# Patient Record
Sex: Male | Born: 1975 | Race: Black or African American | Hispanic: No | Marital: Single | Smoking: Current every day smoker
Health system: Southern US, Community
[De-identification: ages and names within clinical notes are randomized; demographics above are authoritative.]

## PROBLEM LIST (undated history)

## (undated) DIAGNOSIS — Z789 Other specified health status: Secondary | ICD-10-CM

## (undated) DIAGNOSIS — F1010 Alcohol abuse, uncomplicated: Secondary | ICD-10-CM

## (undated) DIAGNOSIS — Z5900 Homelessness unspecified: Secondary | ICD-10-CM

## (undated) DIAGNOSIS — F4320 Adjustment disorder, unspecified: Secondary | ICD-10-CM

## (undated) DIAGNOSIS — I10 Essential (primary) hypertension: Secondary | ICD-10-CM

## (undated) DIAGNOSIS — M419 Scoliosis, unspecified: Secondary | ICD-10-CM

## (undated) DIAGNOSIS — K922 Gastrointestinal hemorrhage, unspecified: Secondary | ICD-10-CM

## (undated) DIAGNOSIS — I509 Heart failure, unspecified: Secondary | ICD-10-CM

## (undated) HISTORY — PX: NO PAST SURGERIES: SHX2092

---

## 2007-01-21 ENCOUNTER — Emergency Department (HOSPITAL_COMMUNITY): Admission: EM | Admit: 2007-01-21 | Discharge: 2007-01-21 | Payer: Self-pay | Admitting: Emergency Medicine

## 2007-06-11 ENCOUNTER — Emergency Department (HOSPITAL_COMMUNITY): Admission: EM | Admit: 2007-06-11 | Discharge: 2007-06-11 | Payer: Self-pay | Admitting: Emergency Medicine

## 2007-10-21 ENCOUNTER — Emergency Department (HOSPITAL_COMMUNITY): Admission: EM | Admit: 2007-10-21 | Discharge: 2007-10-22 | Payer: Self-pay | Admitting: Emergency Medicine

## 2008-07-27 ENCOUNTER — Emergency Department (HOSPITAL_COMMUNITY): Admission: EM | Admit: 2008-07-27 | Discharge: 2008-07-27 | Payer: Self-pay | Admitting: Emergency Medicine

## 2009-03-23 ENCOUNTER — Emergency Department (HOSPITAL_COMMUNITY): Admission: EM | Admit: 2009-03-23 | Discharge: 2009-03-24 | Payer: Self-pay | Admitting: Emergency Medicine

## 2011-03-23 LAB — POCT I-STAT, CHEM 8
BUN: 5 mg/dL — ABNORMAL LOW (ref 6–23)
HCT: 47 % (ref 39.0–52.0)
Hemoglobin: 16 g/dL (ref 13.0–17.0)
Potassium: 3.2 mEq/L — ABNORMAL LOW (ref 3.5–5.1)
TCO2: 24 mmol/L (ref 0–100)

## 2011-03-23 LAB — DIFFERENTIAL
Basophils Absolute: 0 10*3/uL (ref 0.0–0.1)
Basophils Relative: 1 % (ref 0–1)
Eosinophils Absolute: 0.8 10*3/uL — ABNORMAL HIGH (ref 0.0–0.7)
Eosinophils Relative: 12 % — ABNORMAL HIGH (ref 0–5)
Neutro Abs: 3.1 10*3/uL (ref 1.7–7.7)

## 2011-03-23 LAB — ETHANOL: Alcohol, Ethyl (B): 245 mg/dL — ABNORMAL HIGH (ref 0–10)

## 2011-06-23 ENCOUNTER — Emergency Department (HOSPITAL_COMMUNITY): Payer: Medicaid Other

## 2011-06-23 ENCOUNTER — Emergency Department (HOSPITAL_COMMUNITY)
Admission: EM | Admit: 2011-06-23 | Discharge: 2011-06-23 | Disposition: A | Discharge status: Home / Self Care | Payer: Medicaid Other | Attending: Emergency Medicine | Admitting: Emergency Medicine

## 2011-06-23 DIAGNOSIS — R51 Headache: Secondary | ICD-10-CM | POA: Insufficient documentation

## 2011-06-23 DIAGNOSIS — H53149 Visual discomfort, unspecified: Secondary | ICD-10-CM | POA: Insufficient documentation

## 2011-06-23 DIAGNOSIS — H532 Diplopia: Secondary | ICD-10-CM | POA: Insufficient documentation

## 2011-06-23 DIAGNOSIS — I10 Essential (primary) hypertension: Secondary | ICD-10-CM | POA: Insufficient documentation

## 2011-06-23 LAB — BASIC METABOLIC PANEL
BUN: 10 mg/dL (ref 6–23)
CO2: 25 mEq/L (ref 19–32)
Calcium: 10.2 mg/dL (ref 8.4–10.5)
Chloride: 100 mEq/L (ref 96–112)
Creatinine, Ser: 1.12 mg/dL (ref 0.50–1.35)
GFR calc non Af Amer: >60 mL/min (ref >60)
Potassium: 4.1 mEq/L (ref 3.5–5.1)
Sodium: 135 mEq/L (ref 135–145)

## 2011-06-23 LAB — SEDIMENTATION RATE: Sed Rate: 1 mm/hr (ref 0–16)

## 2013-02-13 ENCOUNTER — Emergency Department (HOSPITAL_COMMUNITY)
Admission: EM | Admit: 2013-02-13 | Discharge: 2013-02-14 | Disposition: A | Discharge status: Other Acute Inpatient Hospital | Payer: Medicaid Other | Attending: Emergency Medicine | Admitting: Emergency Medicine

## 2013-02-13 ENCOUNTER — Encounter (HOSPITAL_COMMUNITY): Payer: Self-pay | Admitting: *Deleted

## 2013-02-13 DIAGNOSIS — F17200 Nicotine dependence, unspecified, uncomplicated: Secondary | ICD-10-CM | POA: Insufficient documentation

## 2013-02-13 DIAGNOSIS — Z8739 Personal history of other diseases of the musculoskeletal system and connective tissue: Secondary | ICD-10-CM | POA: Insufficient documentation

## 2013-02-13 DIAGNOSIS — F3289 Other specified depressive episodes: Secondary | ICD-10-CM | POA: Insufficient documentation

## 2013-02-13 DIAGNOSIS — F1010 Alcohol abuse, uncomplicated: Secondary | ICD-10-CM | POA: Insufficient documentation

## 2013-02-13 DIAGNOSIS — R45851 Suicidal ideations: Secondary | ICD-10-CM | POA: Insufficient documentation

## 2013-02-13 HISTORY — DX: Scoliosis, unspecified: M41.9

## 2013-02-13 LAB — CBC WITH DIFFERENTIAL/PLATELET
Eosinophils Relative: 6 % — ABNORMAL HIGH (ref 0–5)
HCT: 46.9 % (ref 39.0–52.0)
MCHC: 35.4 g/dL (ref 30.0–36.0)
MCV: 85.3 fL (ref 78.0–100.0)
Monocytes Absolute: 0.5 10*3/uL (ref 0.1–1.0)
Neutro Abs: 2.9 10*3/uL (ref 1.7–7.7)

## 2013-02-13 LAB — ETHANOL: Alcohol, Ethyl (B): 195 mg/dL — ABNORMAL HIGH (ref 0–11)

## 2013-02-13 LAB — COMPREHENSIVE METABOLIC PANEL
ALT: 20 U/L (ref 0–53)
AST: 26 U/L (ref 0–37)
Albumin: 4.3 g/dL (ref 3.5–5.2)
Calcium: 9.3 mg/dL (ref 8.4–10.5)
Creatinine, Ser: 0.94 mg/dL (ref 0.50–1.35)
GFR calc Af Amer: >90 mL/min (ref >90)
Glucose, Bld: 83 mg/dL (ref 70–99)

## 2013-02-13 MED ORDER — ONDANSETRON HCL 4 MG PO TABS: 4.0000 mg | ORAL_TABLET | Freq: Three times a day (TID) | ORAL | Status: DC | PRN

## 2013-02-13 MED ORDER — ALUM & MAG HYDROXIDE-SIMETH 200-200-20 MG/5ML PO SUSP
30.0000 mL | ORAL | Status: DC | PRN
Start: 2013-02-13 — End: 2013-02-14

## 2013-02-13 MED ORDER — THIAMINE HCL 100 MG/ML IJ SOLN
100.0000 mg | Freq: Once | INTRAMUSCULAR | Status: AC
  Administered 2013-02-13: 21:00:00 via INTRAVENOUS
  Filled 2013-02-13: qty 2

## 2013-02-13 MED ORDER — LORAZEPAM 1 MG PO TABS: 1.0000 mg | ORAL_TABLET | Freq: Three times a day (TID) | ORAL | Status: DC | PRN

## 2013-02-13 MED ORDER — ACETAMINOPHEN 325 MG PO TABS: 650.0000 mg | ORAL_TABLET | ORAL | Status: DC | PRN

## 2013-02-13 MED ORDER — SODIUM CHLORIDE 0.9 % IV BOLUS (SEPSIS)
1000.0000 mL | Freq: Once | INTRAVENOUS | Status: AC
  Administered 2013-02-13: 1000 mL via INTRAVENOUS

## 2013-02-13 NOTE — ED Notes (Signed)
Pt's states he called 911 d/t he wanted to harm himself. Pt states that he was trying to hurt himself with a fork. Pt with several superficial cuts to right arm. Pt with etoh on board.

## 2013-02-13 NOTE — ED Provider Notes (Signed)
History    CSN: [id number] Arrival date & time 02/13/13  2034 First MD Initiated Contact with Patient 02/13/13 2046      Chief Complaint  Patient presents with  . Psychiatric Evaluation   HPI The patient presented to the emergency room with complaints of depression and suicidal ideation.  The patient had been drinking alcohol. He called 911 because he was depressed and he wants to harm himself. Patient was found stabbing his arm with a barbecue fork.  He states he is depressed about lots of things but cannot specify.  He denies any ingestion. Past Medical History  Diagnosis Date  . Scoliosis     No past surgical history on file.  No family history on file.  History  Substance Use Topics  . Smoking status: Current Every Day Smoker    Types: Cigarettes  . Smokeless tobacco: Not on file  . Alcohol Use: Yes      Review of Systems  All other systems reviewed and are negative.    Allergies  Review of patient's allergies indicates no known allergies.  Home Medications  No current outpatient prescriptions on file.  BP 128/94  Pulse 92  Temp(Src) 98.3 F (36.8 C) (Oral)  Resp 16  Ht 4\' 9"  (1.448 m)  Wt 135 lb (61.236 kg)  BMI 29.21 kg/m2  SpO2 100%  Physical Exam  Nursing note and vitals reviewed. Constitutional: He appears well-developed and well-nourished. No distress.  Patient appears intoxicated, his speech is slurred  HENT:  Head: Normocephalic and atraumatic.  Right Ear: External ear normal.  Left Ear: External ear normal.  Eyes: Conjunctivae are normal. Right eye exhibits no discharge. Left eye exhibits no discharge. No scleral icterus.  Neck: Neck supple. No tracheal deviation present.  Cardiovascular: Normal rate, regular rhythm and intact distal pulses.   Pulmonary/Chest: Effort normal and breath sounds normal. No stridor. No respiratory distress. He has no wheezes. He has no rales.  Abdominal: Soft. Bowel sounds are normal. He exhibits no distension.  There is no tenderness. There is no rebound and no guarding.  Musculoskeletal: He exhibits no edema and no tenderness.  Neurological: He is alert. He has normal strength. No sensory deficit. Cranial nerve deficit:  no gross defecits noted. He exhibits normal muscle tone. He displays no seizure activity. Coordination abnormal.  Skin: Skin is warm and dry. No rash noted. He is not diaphoretic.  Psychiatric: His speech is slurred. He is not agitated and not aggressive. He exhibits a depressed mood. He expresses suicidal ideation.    ED Course  Procedures (including critical care time)  Labs Reviewed  CBC WITH DIFFERENTIAL - Abnormal; Notable for the following:    Neutrophils Relative 40 (*)    Eosinophils Relative 6 (*)    All other components within normal limits  COMPREHENSIVE METABOLIC PANEL - Abnormal; Notable for the following:    Total Protein 8.4 (*)    Total Bilirubin 0.2 (*)    All other components within normal limits  ETHANOL - Abnormal; Notable for the following:    Alcohol, Ethyl (B) 195 (*)    All other components within normal limits   No results found.    MDM  The patient is intoxicated here in the emergency room but is also complaining of suicidal ideation. The patient will sober up in the emergency department and then the  Act team will assess him        Celene Kras, MD 02/13/13 [id number]

## 2013-02-13 NOTE — ED Notes (Signed)
[id number]<AV> Expected date:<BR> Expected time:<BR> Means of arrival:<BR> Comments:<BR> EMS/IVC-in by [facility]

## 2013-02-13 NOTE — ED Notes (Addendum)
Pt belongings in 2 bags at nurses' station.    Security called to search pt and belongings.

## 2013-02-14 ENCOUNTER — Encounter (HOSPITAL_COMMUNITY): Payer: Self-pay | Admitting: *Deleted

## 2013-02-14 ENCOUNTER — Inpatient Hospital Stay (HOSPITAL_COMMUNITY)
Admission: EM | Admit: 2013-02-14 | Discharge: 2013-02-15 | DRG: 882 | Disposition: A | Discharge status: Home / Self Care | Payer: Medicaid Other | Source: Intra-hospital | Attending: Psychiatry | Admitting: Psychiatry

## 2013-02-14 ENCOUNTER — Encounter (HOSPITAL_COMMUNITY): Payer: Self-pay

## 2013-02-14 DIAGNOSIS — F1210 Cannabis abuse, uncomplicated: Secondary | ICD-10-CM

## 2013-02-14 DIAGNOSIS — F411 Generalized anxiety disorder: Secondary | ICD-10-CM

## 2013-02-14 DIAGNOSIS — F4323 Adjustment disorder with mixed anxiety and depressed mood: Principal | ICD-10-CM

## 2013-02-14 DIAGNOSIS — Z79899 Other long term (current) drug therapy: Secondary | ICD-10-CM

## 2013-02-14 HISTORY — DX: Other specified health status: Z78.9

## 2013-02-14 LAB — RAPID URINE DRUG SCREEN, HOSP PERFORMED
Cocaine: NOT DETECTED
Opiates: NOT DETECTED

## 2013-02-14 MED ORDER — ONDANSETRON 4 MG PO TBDP: 4.0000 mg | ORAL_TABLET | Freq: Four times a day (QID) | ORAL | Status: DC | PRN

## 2013-02-14 MED ORDER — MAGNESIUM HYDROXIDE 400 MG/5ML PO SUSP: 30.0000 mL | Freq: Every day | ORAL | Status: DC | PRN

## 2013-02-14 MED ORDER — PNEUMOCOCCAL VAC POLYVALENT 25 MCG/0.5ML IJ INJ: 0.5000 mL | INJECTION | INTRAMUSCULAR | Status: DC

## 2013-02-14 MED ORDER — CHLORDIAZEPOXIDE HCL 25 MG PO CAPS
25.0000 mg | ORAL_CAPSULE | Freq: Once | ORAL | Status: AC
  Administered 2013-02-14: 25 mg via ORAL
  Filled 2013-02-14: qty 1

## 2013-02-14 MED ORDER — ADULT MULTIVITAMIN W/MINERALS CH
1.0000 | ORAL_TABLET | Freq: Every day | ORAL | Status: DC
  Administered 2013-02-14 – 2013-02-15 (×2): 1 via ORAL
  Filled 2013-02-14 (×5): qty 1

## 2013-02-14 MED ORDER — LOPERAMIDE HCL 2 MG PO CAPS: 2.0000 mg | ORAL_CAPSULE | ORAL | Status: DC | PRN

## 2013-02-14 MED ORDER — HYDROXYZINE HCL 25 MG PO TABS: 25.0000 mg | ORAL_TABLET | Freq: Four times a day (QID) | ORAL | Status: DC | PRN

## 2013-02-14 MED ORDER — THIAMINE HCL 100 MG/ML IJ SOLN
100.0000 mg | Freq: Once | INTRAMUSCULAR | Status: AC
  Administered 2013-02-14: 100 mg via INTRAMUSCULAR

## 2013-02-14 MED ORDER — INFLUENZA VIRUS VACC SPLIT PF IM SUSP
0.5000 mL | INTRAMUSCULAR | Status: AC
  Administered 2013-02-15: 0.5 mL via INTRAMUSCULAR

## 2013-02-14 MED ORDER — NICOTINE 14 MG/24HR TD PT24
14.0000 mg | MEDICATED_PATCH | Freq: Every day | TRANSDERMAL | Status: DC
  Administered 2013-02-14 – 2013-02-15 (×2): 14 mg via TRANSDERMAL
  Filled 2013-02-14 (×6): qty 1

## 2013-02-14 MED ORDER — ACETAMINOPHEN 325 MG PO TABS: 650.0000 mg | ORAL_TABLET | Freq: Four times a day (QID) | ORAL | Status: DC | PRN

## 2013-02-14 MED ORDER — ALUM & MAG HYDROXIDE-SIMETH 200-200-20 MG/5ML PO SUSP: 30.0000 mL | ORAL | Status: DC | PRN

## 2013-02-14 MED ORDER — CHLORDIAZEPOXIDE HCL 25 MG PO CAPS
25.0000 mg | ORAL_CAPSULE | Freq: Four times a day (QID) | ORAL | Status: DC | PRN
  Administered 2013-02-14: 25 mg via ORAL
  Filled 2013-02-14: qty 1

## 2013-02-14 MED ORDER — TRAZODONE HCL 50 MG PO TABS
50.0000 mg | ORAL_TABLET | Freq: Every evening | ORAL | Status: DC | PRN
  Administered 2013-02-14: 50 mg via ORAL
  Filled 2013-02-14 (×8): qty 1

## 2013-02-14 MED ORDER — VITAMIN B-1 100 MG PO TABS
100.0000 mg | ORAL_TABLET | Freq: Every day | ORAL | Status: DC
  Administered 2013-02-15: 100 mg via ORAL
  Filled 2013-02-14 (×4): qty 1

## 2013-02-14 NOTE — Progress Notes (Signed)
Adult Psychoeducational Group Note  Date:  02/14/2013 Time:  3:43 PM  Group Topic/Focus:  Overcoming Stress:   The focus of this group is to define stress and help patients assess their triggers.  Participation Level:  Minimal  Participation Quality:  Appropriate  Affect:  Appropriate  Cognitive:  Alert and Appropriate  Insight: Lacking  Engagement in Group:  Engaged  Modes of Intervention:  Education and Support  Additional Comments:  Pt participated in group discussion when called upon. 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 Pt identified that he would walk away from stressful situations in order to cope. Staff identified that walking away from small children could produce a safety hazard. Staff noted the importance of boundary-setting with children as a proactive way to reduce stressful situations.  Reinaldo Raddle K 02/14/2013, 3:43 PM

## 2013-02-14 NOTE — H&P (Signed)
Psychiatric Admission Assessment Adult  Patient Identification:  Fred [name] Date of Evaluation:  02/14/2013 Chief Complaint:  Depressive Disorder History of Present Illness:: Got upset with different things going on at home. He felt that his cousin's father was taking money from him. He was also dealing with the fact he wants to have a closer relationship with his mother and maybe move back with her. She does not want that. He has gotten more frustrated. He called 911 saying that he was going to hurst himself. Aunt raised him, she died in front of him. She helped to take care of her. He has a son in [place] who he hardly sees. Claims he drinks one beer every day six out of seven days. Has had black outs. Smoked marijuana every day: "makes me happy." Elements:  Location:  in patient. Quality:  overwhelmed, ruminates,worries. Severity:  moderate. Timing:  every day. Duration:  building up peaked a day ago. Context:  Difficulty coping. Associated Signs/Synptoms: Depression Symptoms:  depressed mood, (Hypo) Manic Symptoms:  Denies Anxiety Symptoms:  Excessive Worry, Psychotic Symptoms:  Denies PTSD Symptoms: Had a traumatic exposure:  aunt died in front of him   Psychiatric Specialty Exam: Physical Exam  Review of Systems  Constitutional: Negative.   HENT: Negative.   Eyes: Negative.   Respiratory:       6 a day  Cardiovascular: Negative.   Gastrointestinal: Negative.   Genitourinary: Negative.   Skin: Negative.   Neurological: Negative.   Endo/Heme/Allergies: Negative.   Psychiatric/Behavioral: Positive for substance abuse. The patient is nervous/anxious.     Blood pressure 147/101, pulse 70, temperature 98 F (36.7 C), temperature source Oral, resp. rate 16, height 5' (1.524 m), weight 60.782 kg (134 lb).Body mass index is 26.17 kg/(m^2).  General Appearance: Disheveled  Eye Solicitor::  Fair  Speech:  Clear and Coherent  Volume:  Normal  Mood:  wooried  Affect:   Restricted  Thought Process:  Coherent and Goal Directed  Orientation:  Full (Time, Place, and Person)  Thought Content:  WDL and worries and concerns  Suicidal Thoughts:  No  Homicidal Thoughts:  No  Memory:  Immediate;   Fair Recent;   Fair Remote;   Fair  Judgement:  Fair  Insight:  superficial  Psychomotor Activity:  Restlessness  Concentration:  Fair  Recall:  Fair  Akathisia:  No  Handed:  Left  AIMS (if indicated):     Assets:  Desire for Improvement Housing Social Support  Sleep:       Past Psychiatric History: Diagnosis: Adjustment Disorder with mixed emotional features, marijuana, alcohol abuse  Hospitalizations: Denies  Outpatient Care: Denies  Substance Abuse Care: Denies  Self-Mutilation: Denies  Suicidal Attempts: Denies  Violent Behaviors:Denies   Past Medical History:   Past Medical History  Diagnosis Date  . Scoliosis   . Medical history non-contributory     Allergies:  No Known Allergies PTA Medications: No prescriptions prior to admission    Previous Psychotropic Medications:  Medication/Dose  Denies               Substance Abuse History in the last 12 months:  yes  Consequences of Substance Abuse: Blackouts:    Social History:  reports that he has been smoking Cigarettes.  He has a 10 pack-year smoking history. He does not have any smokeless tobacco history on file. He reports that  drinks alcohol. He reports that he uses illicit drugs (Marijuana) about 6 times per week. Additional Social History:  Current Place of Residence:  Lives with his cousin.  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 Legal History: Early on some charges Hobbies/Interests:  Family History:  No family history on file.  Results for orders placed during the hospital encounter of 02/13/13 (from the past 72 hour(s))  CBC WITH DIFFERENTIAL     Status: Abnormal   Collection Time    02/13/13  9:22 PM      Result Value Range   WBC 7.2  4.0 - 10.5 K/uL   RBC 5.50  4.22 - 5.81 MIL/uL   Hemoglobin 16.6  13.0 - 17.0 g/dL   HCT 40.9  81.1 - 91.4 %   MCV 85.3  78.0 - 100.0 fL   MCH 30.2  26.0 - 34.0 pg   MCHC 35.4  30.0 - 36.0 g/dL   RDW 78.2  95.6 - 21.3 %   Platelets 248  150 - 400 K/uL   Neutrophils Relative 40 (*) 43 - 77 %   Neutro Abs 2.9  1.7 - 7.7 K/uL   Lymphocytes Relative 46  12 - 46 %   Lymphs Abs 3.3  0.7 - 4.0 K/uL   Monocytes Relative 7  3 - 12 %   Monocytes Absolute 0.5  0.1 - 1.0 K/uL   Eosinophils Relative 6 (*) 0 - 5 %   Eosinophils Absolute 0.5  0.0 - 0.7 K/uL   Basophils Relative 1  0 - 1 %   Basophils Absolute 0.0  0.0 - 0.1 K/uL  COMPREHENSIVE METABOLIC PANEL     Status: Abnormal   Collection Time    02/13/13  9:22 PM      Result Value Range   Sodium 143  135 - 145 mEq/L   Potassium 3.7  3.5 - 5.1 mEq/L   Chloride 104  96 - 112 mEq/L   CO2 26  19 - 32 mEq/L   Glucose, Bld 83  70 - 99 mg/dL   BUN 10  6 - 23 mg/dL   Creatinine, Ser 0.86  0.50 - 1.35 mg/dL   Calcium 9.3  8.4 - 57.8 mg/dL   Total Protein 8.4 (*) 6.0 - 8.3 g/dL   Albumin 4.3  3.5 - 5.2 g/dL   AST 26  0 - 37 U/L   ALT 20  0 - 53 U/L   Alkaline Phosphatase 98  39 - 117 U/L   Total Bilirubin 0.2 (*) 0.3 - 1.2 mg/dL   GFR calc non Af Amer >90  >90 mL/min   GFR calc Af Amer >90  >90 mL/min   Comment:            The eGFR has been calculated     using the CKD EPI equation.     This calculation has not been     validated in all clinical     situations.     eGFR's persistently     <90 mL/min signify     possible Chronic  Kidney Disease.  ETHANOL     Status: Abnormal   Collection Time    02/13/13  9:22 PM      Result Value Range   Alcohol,  Ethyl (B) 195 (*) 0 - 11 mg/dL   Comment:            LOWEST DETECTABLE LIMIT FOR     SERUM ALCOHOL IS 11 mg/dL     FOR MEDICAL PURPOSES ONLY  URINE RAPID DRUG SCREEN (HOSP PERFORMED)     Status: Abnormal   Collection Time    02/14/13  2:23 AM      Result Value Range   Opiates NONE DETECTED  NONE DETECTED   Cocaine NONE DETECTED  NONE DETECTED   Benzodiazepines NONE DETECTED  NONE DETECTED   Amphetamines NONE DETECTED  NONE DETECTED   Tetrahydrocannabinol POSITIVE (*) NONE DETECTED   Barbiturates NONE DETECTED  NONE DETECTED   Comment:            DRUG SCREEN FOR MEDICAL PURPOSES     ONLY.  IF CONFIRMATION IS NEEDED     FOR ANY PURPOSE, NOTIFY LAB     WITHIN 5 DAYS.                LOWEST DETECTABLE LIMITS     FOR URINE DRUG SCREEN     Drug Class       Cutoff (ng/mL)     Amphetamine      1000     Barbiturate      200     Benzodiazepine   200     Tricyclics       300     Opiates          300     Cocaine          300     THC              50   Psychological Evaluations:  Assessment:   AXIS I:  Anxiety Disorder NOS, Adjustment Disorder with mixed emotional features, Marijuana Abuse AXIS II:  Deferred AXIS III:   Past Medical History  Diagnosis Date  . Scoliosis   . Medical history non-contributory    AXIS IV:  problems with primary support group AXIS V:  41-50  Treatment Plan/Recommendations:  Supportive approach/coping skills                                                                 Reassess for need of taking psychotropics  Treatment Plan Summary: Daily contact with patient to assess and evaluate symptoms and progress in treatment Medication management Get more information Current Medications:  Current Facility-Administered Medications  Medication Dose Route Frequency Trenyce Loera Last Rate Last Dose  . acetaminophen (TYLENOL) tablet 650 mg   650 mg Oral Q6H PRN Kerry Hough, PA-C      . alum & mag hydroxide-simeth (MAALOX/MYLANTA) 200-200-20 MG/5ML suspension 30 mL  30 mL Oral Q4H PRN Kerry Hough, PA-C      . chlordiazePOXIDE (LIBRIUM) capsule 25 mg  25 mg Oral Q6H PRN Kerry Hough, PA-C      . hydrOXYzine (ATARAX/VISTARIL) tablet 25 mg  25 mg Oral Q6H PRN Kerry Hough, PA-C      . [START ON 02/15/2013] influenza  inactive virus vaccine (FLUZONE/FLUARIX) injection 0.5 mL  0.5 mL Intramuscular Tomorrow-1000 Rachael Fee, MD      . loperamide (IMODIUM) capsule 2-4 mg  2-4 mg  Oral PRN Kerry Hough, PA-C      . magnesium hydroxide (MILK OF MAGNESIA) suspension 30 mL  30 mL Oral Daily PRN Kerry Hough, PA-C      . multivitamin with minerals tablet 1 tablet  1 tablet Oral Daily Kerry Hough, PA-C   1 tablet at 02/14/13 0837  . nicotine (NICODERM CQ - dosed in mg/24 hours) patch 14 mg  14 mg Transdermal Q0600 Kerry Hough, PA-C   14 mg at 02/14/13 1478  . ondansetron (ZOFRAN-ODT) disintegrating tablet 4 mg  4 mg Oral Q6H PRN Kerry Hough, PA-C      . [START ON 02/15/2013] pneumococcal 23 valent vaccine (PNU-IMMUNE) injection 0.5 mL  0.5 mL Intramuscular Tomorrow-1000 Rachael Fee, MD      . Melene Muller ON 02/15/2013] thiamine (VITAMIN B-1) tablet 100 mg  100 mg Oral Daily Spencer E Simon, PA-C      . traZODone (DESYREL) tablet 50 mg  50 mg Oral QHS,MR X 1 Kerry Hough, PA-C        Observation Level/Precautions:  15 minute checks  Laboratory:  As per the ED  Psychotherapy:  Individual/group  Medications:  None  Consultations:    Discharge Concerns:    Estimated LOS: 3 days  Other:     I certify that inpatient services furnished can reasonably be expected to improve the patient's condition.   LUGO,IRVING A 3/6/20148:51 AM

## 2013-02-14 NOTE — BH Assessment (Signed)
Assessment Note   Fred [name] is an 37 y.o. male. Patient presented to the ED after calling 911 stating that he wanted to harm himself. Patient states that he felt like "everything was closing in" . Patient states that he got into an argument with his cousin's father today because he was trying to explain that he was missing money out of his bag. He stated that no one ever listens to him and he feels misunderstood. Patient states that his mother is also a precipitant for him. He states that he has not seen his mother who lives in [place] since he was 37 years old. He was raised by family members here in [place]. States that he last spoke with his mother in December 2013 and she is not agreeable to him coming to stay with her in [place].   Patient states he felt that everything was closing in and that no one was listening to him or understood him so he went into the hallway and attempted "to cut my main artery with the pitch fork." Patient denies any prior attempts and no history of SIB. Patient is currently unable to contract for safety and is in need of inpatient stabilization.   Axis I: Depressive Disorder NOS and THC Abuse, Alcohol Abuse Axis II: Deferred Axis III:  Past Medical History  Diagnosis Date  . Scoliosis    Axis IV: other psychosocial or environmental problems, problems related to social environment and problems with primary support group Axis V: 35  Past Medical History:  Past Medical History  Diagnosis Date  . Scoliosis     History reviewed. No pertinent past surgical history.  Family History: History reviewed. No pertinent family history.  Social History:  reports that he has been smoking Cigarettes.  He has been smoking about 0.00 packs per day. He does not have any smokeless tobacco history on file. He reports that  drinks alcohol. He reports that he does not use illicit drugs.  Additional Social History:  Alcohol / Drug Use History of alcohol / drug use?:  Yes Substance #1 Name of Substance 1: THC 1 - Age of First Use: 17 1 - Amount (size/oz): "As much as I can get" 1 - Frequency: Daily 1 - Duration: years 1 - Last Use / Amount: last night/ 1 joint Substance #2 Name of Substance 2: Alcohol 2 - Age of First Use: 17 2 - Amount (size/oz): few beers 2 - Frequency: sporadic use 2 - Duration: years 2 - Last Use / Amount: last night/few beers  CIWA: CIWA-Ar BP: 128/94 mmHg Pulse Rate: 92 COWS:    Allergies: No Known Allergies  Home Medications:  (Not in a hospital admission)  OB/GYN Status:  No LMP for male patient.  General Assessment Data Location of Assessment: [facility] Living Arrangements: Other relatives (Cousin) Can pt return to current living arrangement?: Yes Admission Status: Involuntary Is patient capable of signing voluntary admission?: Yes Transfer from: Home Referral Source: MD  Education Status Is patient currently in school?: No Current Grade:  (Na) Highest grade of school patient has completed:  (11th) Name of school:  [employer]) Contact person:  Ethelene Browns [place]/ Cousin)  Risk to self Suicidal Ideation: Yes-Currently Present Suicidal Intent: Yes-Currently Present Is patient at risk for suicide?: Yes Suicidal Plan?: Yes-Currently Present Specify Current Suicidal Plan:  (attempted to "cut main artery with barbecue fork) Access to Means: Yes Specify Access to Suicidal Means:  (sharps) What has been your use of drugs/alcohol within  the last 12 months?:  (Daily) Previous Attempts/Gestures: No How many times?:  (None noted) Other Self Harm Risks:  (None noted) Triggers for Past Attempts: None known Intentional Self Injurious Behavior: None Family Suicide History: No Recent stressful life event(s): Conflict (Comment) (with family members) Persecutory voices/beliefs?: No Depression: Yes Depression Symptoms: Feeling angry/irritable;Feeling worthless/self pity Substance abuse history and/or  treatment for substance abuse?: Yes Suicide prevention information given to non-admitted patients: Not applicable  Risk to Others Homicidal Ideation: No Thoughts of Harm to Others: No Current Homicidal Intent: No Current Homicidal Plan: No Access to Homicidal Means: No Identified Victim:  (Na) History of harm to others?: No Assessment of Violence: None Noted Violent Behavior Description:  (Na) Does patient have access to weapons?: No Criminal Charges Pending?: No Does patient have a court date: No  Psychosis Hallucinations: None noted Delusions: None noted  Mental Status Report Appear/Hygiene:  (Blue scrubs) Eye Contact: Fair Motor Activity: Freedom of movement;Unremarkable Speech: Logical/coherent Level of Consciousness: Alert Mood: Depressed;Worthless, low self-esteem Affect: Appropriate to circumstance Anxiety Level: None Thought Processes: Coherent;Relevant Judgement: Impaired Orientation: Person;Place;Time;Situation Obsessive Compulsive Thoughts/Behaviors: None  Cognitive Functioning Concentration: Normal Memory: Recent Intact;Remote Intact IQ: Average Insight: Poor Impulse Control: Poor Appetite: Good Weight Loss:  (None noted) Weight Gain:  (none noted) Sleep: No Change Total Hours of Sleep:  (8 hours) Vegetative Symptoms: None  ADLScreening [facility] Assessment Services) Patient's cognitive ability adequate to safely complete daily activities?: Yes Patient able to express need for assistance with ADLs?: Yes Independently performs ADLs?: Yes (appropriate for developmental age)  Abuse/Neglect [facility]) Physical Abuse: Denies Verbal Abuse: Denies Sexual Abuse: Denies  Prior Inpatient Therapy Prior Inpatient Therapy: No  Prior Outpatient Therapy Prior Outpatient Therapy: No  ADL Screening (condition at time of admission) Patient's cognitive ability adequate to safely complete daily activities?: Yes Patient able to express need for assistance with ADLs?:  Yes Independently performs ADLs?: Yes (appropriate for developmental age) Weakness of Legs: None Weakness of Arms/Hands: None       Abuse/Neglect Assessment (Assessment to be complete while patient is alone) Physical Abuse: Denies Verbal Abuse: Denies Sexual Abuse: Denies Exploitation of patient/patient's resources: Denies Self-Neglect: Denies Values / Beliefs Cultural Requests During Hospitalization: None Spiritual Requests During Hospitalization: None   Advance Directives (For Healthcare) Advance Directive: Patient does not have advance directive;Patient would like information    Additional Information 1:1 In Past 12 Months?: No CIRT Risk: No Elopement Risk: No Does patient have medical clearance?: Yes     Disposition:  Disposition Initial Assessment Completed: Yes Disposition of Patient: Inpatient treatment program Type of inpatient treatment program: Adult  On Site Evaluation by:   Reviewed with Physician:     Rudi Coco 02/14/2013 3:02 AM

## 2013-02-14 NOTE — Progress Notes (Signed)
D: Patient appropriate and cooperative with staff and peers. Patient's affect/mood is anxious. He reported on the self inventory sheet that his appetite is good, energy level is normal and ability to pay attention is good. Patient rated depression and feelings of hopelessness "1". He reported that changes he plans to make to better care for self are to slow down with his drinking and do something more constructive.  A: Support and encouragement provided to patient. Administered scheduled medications per ordering MD. Monitor Q15 minute checks for safety.  R: Patient receptive. Denies SI/HI/AVH. Patient remains safe.

## 2013-02-14 NOTE — BHH Counselor (Signed)
Patient has been accepted at [facility] by Donell Sievert PA to the services of Dr. Dub Mikes, Room 302.2

## 2013-02-14 NOTE — Tx Team (Signed)
Initial Interdisciplinary Treatment Plan  PATIENT STRENGTHS: (choose at least two) Ability for insight Active sense of humor Average or above average intelligence Capable of independent living Communication skills General fund of knowledge Motivation for treatment/growth Physical Health Special hobby/interest Supportive family/friends  PATIENT STRESSORS: Substance abuse   PROBLEM LIST: Problem List/Patient Goals Date to be addressed Date deferred Reason deferred Estimated date of resolution  "I need somebody to talk to about my drinking" 02/14/13           "To socialize" 02/14/13           Depression 02/14/13     Increased risk for suicide 02/14/13      Substance abuse 02/14/13                  DISCHARGE CRITERIA:  Ability to meet basic life and health needs Adequate post-discharge living arrangements Improved stabilization in mood, thinking, and/or behavior Medical problems require only outpatient monitoring Motivation to continue treatment in a less acute level of care Need for constant or close observation no longer present Reduction of life-threatening or endangering symptoms to within safe limits Safe-care adequate arrangements made Verbal commitment to aftercare and medication compliance Withdrawal symptoms are absent or subacute and managed without 24-hour nursing intervention  PRELIMINARY DISCHARGE PLAN: Attend 12-step recovery group Outpatient therapy Participate in family therapy Return to previous living arrangement  PATIENT/FAMIILY INVOLVEMENT: This treatment plan has been presented to and reviewed with the patient, Fred [name], and/or family member.  The patient and family have been given the opportunity to ask questions and make suggestions.  [name] [facility] 02/14/2013, 6:26 AM

## 2013-02-14 NOTE — Progress Notes (Signed)
Patient ID: Fred [name], male   DOB: [birth year], 37 y.o.   MRN: [id number]  Pt was pleasant and cooperative during the adm process. This is pt's first IP treatment. When asked the circumstances surrounding his adm, pt stated, "Too much was going on at the time.  I forgot now. Oh, I was trying to tell my cousin something, but he didn't wanna listen". Writer encouraged pt to give more info. Pt stated that he (pt), accused somebody of his money, but wouldn't go into further detail. Pt stated he took a bbq fork and began scratching his rt arm. However, pt stated he wasn't trying to kill himself. "I love myself too much for that". Pt admits to ETOH and THC, but was vague on how much.

## 2013-02-14 NOTE — Progress Notes (Signed)
[facility] LCSW Group Therapy  02/14/2013   Type of Therapy:  Group Therapy from 1:15 to 2:30 PM  Participation Level:  Minimal  Participation Quality: Distracting for others  Affect:  Defensive, sarcastic  Cognitive:  Alert and Oriented     Insight: Limited  Engagement in Therapy:   Lacking  Modes of Intervention: Activity,  Exploration, limit setting and support  Summary of Progress/Problems: Focus of group processing discussion was on balance in life; the components in life which have a negative influence on balance and the components which make for a more balanced life.  Patient was initiating side conversations with another patient during group and when limits were given he became sarcastic.  Patient was asked to limit sarcasm or leave the group, afterward he became calmer. Did not participate in discussion.   Clide Dales

## 2013-02-14 NOTE — [facility] Suicide Risk Assessment (Signed)
Suicide Risk Assessment  Admission Assessment     Nursing information obtained from:  Patient Demographic factors:  Male;Low socioeconomic status;Unemployed Current Mental Status:  NA Loss Factors:  NA Historical Factors:  NA Risk Reduction Factors:  Responsible for children under 37 years of age;Sense of responsibility to family;Religious beliefs about death;Living with another person, especially a relative;Positive social support  CLINICAL FACTORS: Poor coping skills    COGNITIVE FEATURES THAT CONTRIBUTE TO RISK:  Closed-mindedness Polarized thinking Thought constriction (tunnel vision)    SUICIDE RISK:   Mild:  Suicidal ideation of limited frequency, intensity, duration, and specificity.  There are no identifiable plans, no associated intent, mild dysphoria and related symptoms, good self-control (both objective and subjective assessment), few other risk factors, and identifiable protective factors, including available and accessible social support.  PLAN OF CARE: Supportive approach/coping skills                               Will get more information and reassess                                  I certify that inpatient services furnished can reasonably be expected to improve the patient's condition.  Javia Dillow A 02/14/2013, 5:00 PM

## 2013-02-14 NOTE — [facility] Suicide Risk Assessment (Signed)
Suicide Risk Assessment  Admission Assessment     Nursing information obtained from:  Patient Demographic factors:  Male;Low socioeconomic status;Unemployed Current Mental Status:  NA Loss Factors:  NA Historical Factors:  NA Risk Reduction Factors:  Responsible for children under 37 years of age;Sense of responsibility to family;Religious beliefs about death;Living with another person, especially a relative;Positive social support  CLINICAL FACTORS: Poor coping skills    COGNITIVE FEATURES THAT CONTRIBUTE TO RISK:  Closed-mindedness Polarized thinking Thought constriction (tunnel vision)    SUICIDE RISK:   Mild:  Suicidal ideation of limited frequency, intensity, duration, and specificity.  There are no identifiable plans, no associated intent, mild dysphoria and related symptoms, good self-control (both objective and subjective assessment), few other risk factors, and identifiable protective factors, including available and accessible social support.  PLAN OF CARE: Supportive approach/coping skills                              Get more information, reassess his presentation, other co morbidities  I certify that inpatient services furnished can reasonably be expected to improve the patient's condition.  LUGO,IRVING A 02/14/2013, 5:09 PM

## 2013-02-14 NOTE — ED Notes (Signed)
One bag in locker 41 

## 2013-02-14 NOTE — Progress Notes (Signed)
[facility] LCSW Aftercare Discharge Planning Group Note  02/14/2013   Participation Quality: Appropriate  Affect:  Flat   Cognitive: Alert and Oriented     Insight: Limited  Engagement in Group:  Minimal   Modes of Intervention:  Clarification, Exploration, and Support  Summary of Progress/Problems:. Patient came into group at end of session yet was willing to share when asked specific questions.  Pt denies both suicidal and homicidal ideation.  On a scale of 1 to 10 with ten being the most ever experienced, the patient rates depression at a 4 and anxiety at a 0. [name] shared "I made a bad decision and ended up here." Patient invested in leaving asap.   Fred [name]

## 2013-02-15 MED ORDER — LISINOPRIL 10 MG PO TABS: 10.0000 mg | ORAL_TABLET | Freq: Every day | ORAL | Status: DC

## 2013-02-15 MED ORDER — TRAZODONE HCL 50 MG PO TABS
50.0000 mg | ORAL_TABLET | Freq: Every evening | ORAL | Status: DC | PRN
  Filled 2013-02-15 (×3): qty 1

## 2013-02-15 MED ORDER — TRAZODONE HCL 50 MG PO TABS: ORAL_TABLET | ORAL | Status: DC

## 2013-02-15 MED ORDER — LISINOPRIL 10 MG PO TABS
10.0000 mg | ORAL_TABLET | Freq: Every day | ORAL | Status: DC
  Administered 2013-02-15: 10 mg via ORAL
  Filled 2013-02-15 (×5): qty 1

## 2013-02-15 NOTE — Progress Notes (Signed)
Discharge Note  D: Patient affect brighter today, as opposed to other previous shifts; his mood was appropriate to the circumstance. He reported that his appetite is good, energy level is normal and ability to pay attention is good. Patient rated depression and feelings of hopelessness "1".  A: Support and encouragement provided. Administered scheduled medications per ordering MD. Discharge instructions/prescriptions given to patient. Returned belongings to patient.  R: Patient receptive. Denies SI/HI/AVH. Patient d/c without incident. Patient verbalized understanding of discharge instructions and prescriptions.

## 2013-02-15 NOTE — Progress Notes (Signed)
[facility] INPATIENT:  Family/Significant Other Suicide Prevention Education  Suicide Prevention Education:  Patient Refusal for Family/Significant Other Suicide Prevention Education: The patient Fred [name] has refused to provide written consent for family/significant other to be provided Family/Significant Other Suicide Prevention Education during admission and/or prior to discharge.  Physician notified.  Writer provided suicide prevention education directly to patient; conversation included risk factors, warning signs and resources to contact for help. Mobile crisis services explained and contact card placed in chart for pt to receive at discharge.  Clide Dales ,

## 2013-02-15 NOTE — Progress Notes (Signed)
[facility] Adult Case Management Discharge Plan :  Will you be returning to the same living situation after discharge: Yes,  to home At discharge, do you have transportation home?:Yes,  buses not running today, [facility] will cover cost of Taxi Do you have the ability to pay for your medications:No.Not applicable as none prescribed  Release of information consent forms completed and in the chart;  Patient's signature needed at discharge.  Patient to Follow up at: Follow-up Information   Follow up with [facility] On 02/19/2013. (Go to [employer] on Tuesday 3.11.14 between the hours of 8AM and noon or 1 PM and 4PM to be seen)    Contact information:   [address] [place] [address] [facility] [phone] Valinda Hoar [phone]      Patient denies SI/HI:   Yes,  denies both    Safety Planning and Suicide Prevention discussed:  Yes,  one to one with patient.  Clide Dales 02/15/2013, 12:49 PM

## 2013-02-15 NOTE — [facility] Suicide Risk Assessment (Signed)
Suicide Risk Assessment  Discharge Assessment     Demographic Factors:  Male and Low socioeconomic status  Mental Status Per Nursing Assessment::   On Admission:  NA  Current Mental Status by Physician: In full contact with reality. There are no suicidal ideas plans or intent. His mood is euthymic, his affect is appropriate. He is wanting to be discharged today. States he has learned better ways of coping so he will not get himself back in this predicament. Willing to see a counselor to get further help.   Loss Factors: NA  Historical Factors: NA  Risk Reduction Factors:   NA  Continued Clinical Symptoms: NA   Cognitive Features That Contribute To Risk:  Closed-mindedness Polarized thinking Thought constriction (tunnel vision)    Suicide Risk:  Minimal: No identifiable suicidal ideation.  Patients presenting with no risk factors but with morbid ruminations; may be classified as minimal risk based on the severity of the depressive symptoms  Discharge Diagnoses:   AXIS I:  Adjustment Disorder with Mixed Emotional Features AXIS II:  Deferred AXIS III:   Past Medical History  Diagnosis Date  . Scoliosis   . Medical history non-contributory    AXIS IV:  other psychosocial or environmental problems and problems with primary support group AXIS V:  61-70 mild symptoms  Plan Of Care/Follow-up recommendations:  Activity:  as tolerated Diet:  regular  Is patient on multiple antipsychotic therapies at discharge:  No   Has Patient had three or more failed trials of antipsychotic monotherapy by history:  No  Recommended Plan for Multiple Antipsychotic Therapies: N/A   LUGO,IRVING A 02/15/2013, 12:17 PM

## 2013-02-15 NOTE — Discharge Summary (Signed)
Physician Discharge Summary Note  Patient:  Fred [name] is an 37 y.o., male MRN:  [id number] DOB:  12-23-1975 Patient phone:  [phone] (home)  Patient address:   [address] [place] [address],   Date of Admission:  02/14/2013 Date of Discharge: 02/15/13  Reason for Admission:  Alcohol and Cannabis abuse  Discharge Diagnoses: Active Problems:   Adjustment disorder with mixed anxiety and depressed mood  Review of Systems  Constitutional: Negative.   HENT: Negative.   Eyes: Negative.   Respiratory: Positive for hemoptysis.   Cardiovascular: Negative.   Gastrointestinal: Negative.   Genitourinary: Negative.   Musculoskeletal: Negative.   Skin: Negative.   Neurological: Negative.   Endo/Heme/Allergies: Negative.   Psychiatric/Behavioral: Positive for substance abuse. Negative for depression, suicidal ideas, hallucinations and memory loss. The patient is not nervous/anxious and does not have insomnia.    Axis Diagnosis:   AXIS I:  Adjustment disorder with mixed anxiety and depressed mood AXIS II:  Deferred AXIS III:   Past Medical History  Diagnosis Date  . Scoliosis   . Medical history non-contributory    AXIS IV:  other psychosocial or environmental problems, problems with primary support group and Subtstance abuse AXIS V:  63  Level of Care:  OP  Hospital Course:  Got upset with different things going on at home. He felt that his cousin's father was taking money from him. He was also dealing with the fact he wants to have a closer relationship with his mother and maybe move back with her. She does not want that. He has gotten more frustrated. He called 911 saying that he was going to hurst himself. Aunt raised him, she died in front of him. She helped to take care of her. He has a son in [place] who he hardly sees. Claims he drinks one beer every day six out of seven days. Has had black outs. Smoked marijuana every day: "makes me happy."  Fred [name]  stay in this hospital was rather very brief. Upon his admission into this hospital and after admission assessment and evaluation, it was determines that patient is intoxicated and will need detoxification treatment to stabilize his system from alcohol intoxication and to combat the withdrawal symptoms of alcohol as well. He was then started on Librium protocol. Fred [name] was also enrolled/attended AA/NA meetings being offered and held on this unit. He also presented with a very high blood pressure levels. His blood pressure was taken both manually and with a Dinamap and the readings remained elevated. At this time, it is quite unsure if the blood pressure is elevated due to the effects of alcohol withdrawal symptoms and or he has had this for quite sometime and never seek treatment. Patient has been approached and this is discussed with him. He was informed that we will initiate an antihypertensive treatment as he is running the risks of a CVA. He stated that he only found out that his blood pressure is elevated after we told him. Lisinopril 10 mg was initiated today. He was instructed and encouraged to stay through the course of his detoxification treatment while we administer his new antihypertensive medication and monitor it as well. However, Fred [name] has decided that he must be discharged to his home today.  He was reminded of the reason for his admission, symptoms, substance abuse issues, response to treatment and discharge plans discussed. Patient endorsed that he is doing well and stable for discharge to pursue the next phase of his substance abuse treatment  on an outpatient basis. He will follow-up care at the [facility] here in [place], [place] on 02/19/13 between the hours of 1:00 and 4PM. The address, date and time for this appointment provided for patient in writing.  He was instructed/encouraged to join/attend AA meetings being offered and held within his community. He was instructed to get a  trusted sponsor from the advise of others or from whomever within the AA meetings seems to make sense, and has a proven track record, and will hold him responsible for his sobriety, and both expects and insists on his total bstinence from alcohol.   Fred [name] is currently being discharged to his home. Upon discharge, patient adamantly denies suicidal, homicidal ideations, auditory, visual hallucinations, delusional thinking and or withdrawal symptoms. Patient left [facility] with all personal belongings in no apparent distress. He received 2 weeks worth samples of his discharge medications, including a prescription on his new antihypertensive medication and an instruction to follow-up with his primary care physician to for this hypertension. Transportation per taxi transport. Taxi fare provided for patient by [facility].  Consults:  None  Significant Diagnostic Studies:  labs: XCBC with diff, CMP, UDS, Toxicology tests  Discharge Vitals:   Blood pressure 167/116, pulse 76, temperature 97.6 F (36.4 C), temperature source Oral, resp. rate 18, height 5' (1.524 m), weight 60.782 kg (134 lb). Body mass index is 26.17 kg/(m^2). Lab Results:   No results found for this or any previous visit (from the past 72 hour(s)).  Physical Findings: AIMS: Facial and Oral Movements Muscles of Facial Expression: None, normal Lips and Perioral Area: None, normal Jaw: None, normal Tongue: None, normal,Extremity Movements Upper (arms, wrists, hands, fingers): None, normal Lower (legs, knees, ankles, toes): None, normal, Trunk Movements Neck, shoulders, hips: None, normal, Overall Severity Severity of abnormal movements (highest score from questions above): None, normal Incapacitation due to abnormal movements: None, normal Patient's awareness of abnormal movements (rate only patient's report): No Awareness,    CIWA:  CIWA-Ar Total: 0 COWS:     Psychiatric Specialty Exam: See Psychiatric Specialty Exam and Suicide Risk  Assessment completed by Attending Physician prior to discharge.  Discharge destination:  Home  Is patient on multiple antipsychotic therapies at discharge:  No   Has Patient had three or more failed trials of antipsychotic monotherapy by history:  No  Recommended Plan for Multiple Antipsychotic Therapies: NA      Discharge Orders   Future Orders Complete By Expires     Diet - low sodium heart healthy  As directed     Increase activity slowly  As directed         Medication List    TAKE these medications     Indication   lisinopril 10 MG tablet  Commonly known as:  PRINIVIL,ZESTRIL  Take 1 tablet (10 mg total) by mouth daily. For hypertension   Indication:  High Blood Pressure     traZODone 50 MG tablet  Commonly known as:  DESYREL  Take 1 tablet at bedtime if needed for sleep   Indication:  Trouble Sleeping, Major Depressive Disorder       Follow-up Information   Follow up with [facility] On 02/19/2013. (Go to [employer] on Tuesday 3.11.14 between the hours of 8AM and noon or 1 PM and 4PM to be seen)    Contact information:   [address] [place] [address] [facility] [phone] FAX [phone]      Follow-up recommendations:  Activity:  As tolerated Other: Diet  as recommended by your doctor.  Keep all scheduled follow-up appointments as recommended.  Comments:  Take all your medications as prescribed by your mental healthcare provider. Report any adverse effects and or reactions from your medicines to your outpatient provider promptly. Patient is instructed and cautioned to not engage in alcohol and or illegal drug use while on prescription medicines. In the event of worsening symptoms, patient is instructed to call the crisis hotline, 911 and or go to the nearest ED for appropriate evaluation and treatment of symptoms. Follow-up with your primary care provider for your other medical issues, concerns and or health care needs.  Continue to work the relapse prevention  plan Total Discharge Time:  Greater than 30 minutes  Signed: Sanjuana Kava, FNP-BC, PMHNP-BC 02/18/2013, 11:54 AM

## 2013-02-15 NOTE — Progress Notes (Signed)
D   Pt is cooperative and pleasant   His blood pressure was running high at 2130 after he returned from group   He reports never  being dx with hypertension but is open to the possibility that he may need to take a blood pressure pill    A   Verbal support given   Medications administered and effectiveness monitored   Provided education on detox and blood pressure and the possibility of need for antihypertensives   Q 15 min checks R   Pt safe at present

## 2013-02-15 NOTE — Progress Notes (Signed)
Patient ID: Fred [name], male   DOB: [birth year], 37 y.o.   MRN: [id number] Patient here 24 hours; discharging today, When offered followup appointment with [facility] for therapy patient accepted.  Clinical Web designer in contact with Taxi company as [facility] Buses are not running today.  Clide Dales

## 2013-02-15 NOTE — Progress Notes (Signed)
Patient did attend the evening karaoke group. Pt was attentive and supportive.   

## 2013-02-15 NOTE — Progress Notes (Signed)
Adult Psychoeducational Group Note  Date:  02/15/2013 Time:  10:38 PM  Group Topic/Focus:  AA group  Participation Level:  Active  Participation Quality:  Appropriate  Affect:  Appropriate  Cognitive:  Alert  Insight: Appropriate  Engagement in Group:  Improving  Modes of Intervention:  Discussion  Additional Comments:    Flonnie Hailstone 02/15/2013, 10:38 PM

## 2013-02-20 NOTE — Progress Notes (Signed)
Patient Discharge Instructions:  After Visit Summary (AVS):   Faxed to:  02/20/13 Discharge Summary Note:   Faxed to:  02/20/13 Psychiatric Admission Assessment Note:   Faxed to:  02/20/13 Suicide Risk Assessment - Discharge Assessment:   Faxed to:  02/20/13 Faxed/Sent to the Next Level Care provider:  02/20/13 Faxed to [facility] @ [phone]  Jerelene Redden, 02/20/2013, 4:03 PM

## 2013-11-01 ENCOUNTER — Emergency Department: Payer: Self-pay | Admitting: Emergency Medicine

## 2013-11-01 LAB — COMPREHENSIVE METABOLIC PANEL
Albumin: 4.2 g/dL (ref 3.4–5.0)
Alkaline Phosphatase: 114 U/L (ref 50–136)
Anion Gap: 3 — ABNORMAL LOW (ref 7–16)
BUN: 14 mg/dL (ref 7–18)
Bilirubin,Total: 0.3 mg/dL (ref 0.2–1.0)
Calcium, Total: 9.1 mg/dL (ref 8.5–10.1)
Chloride: 107 mmol/L (ref 98–107)
Co2: 29 mmol/L (ref 21–32)
Creatinine: 0.94 mg/dL (ref 0.60–1.30)
EGFR (African American): >60
EGFR (Non-African Amer.): >60
Glucose: 99 mg/dL (ref 65–99)
Osmolality: 278 (ref 275–301)
Potassium: 3.5 mmol/L (ref 3.5–5.1)
SGOT(AST): 33 U/L (ref 15–37)
SGPT (ALT): 36 U/L (ref 12–78)
Sodium: 139 mmol/L (ref 136–145)
Total Protein: 8 g/dL (ref 6.4–8.2)

## 2013-11-01 LAB — CBC
HCT: 47.1 % (ref 40.0–52.0)
HGB: 16 g/dL (ref 13.0–18.0)
MCH: 29.7 pg (ref 26.0–34.0)
MCHC: 33.9 g/dL (ref 32.0–36.0)
MCV: 88 fL (ref 80–100)
Platelet: 240 10*3/uL (ref 150–440)
RBC: 5.37 10*6/uL (ref 4.40–5.90)
RDW: 13.8 % (ref 11.5–14.5)
WBC: 6 10*3/uL (ref 3.8–10.6)

## 2013-11-01 LAB — TROPONIN I: Troponin-I: <0.02 ng/mL

## 2013-11-01 LAB — CK TOTAL AND CKMB (NOT AT ARMC): CK-MB: 1.4 ng/mL (ref 0.5–3.6)

## 2014-03-09 IMAGING — CR DG CHEST 1V PORT
1 series · 1 of 1 positions shown · non-contrast
Comparison: None.

CLINICAL DATA: Chest pain.

EXAM:
PORTABLE CHEST - 1 VIEW

[ap]
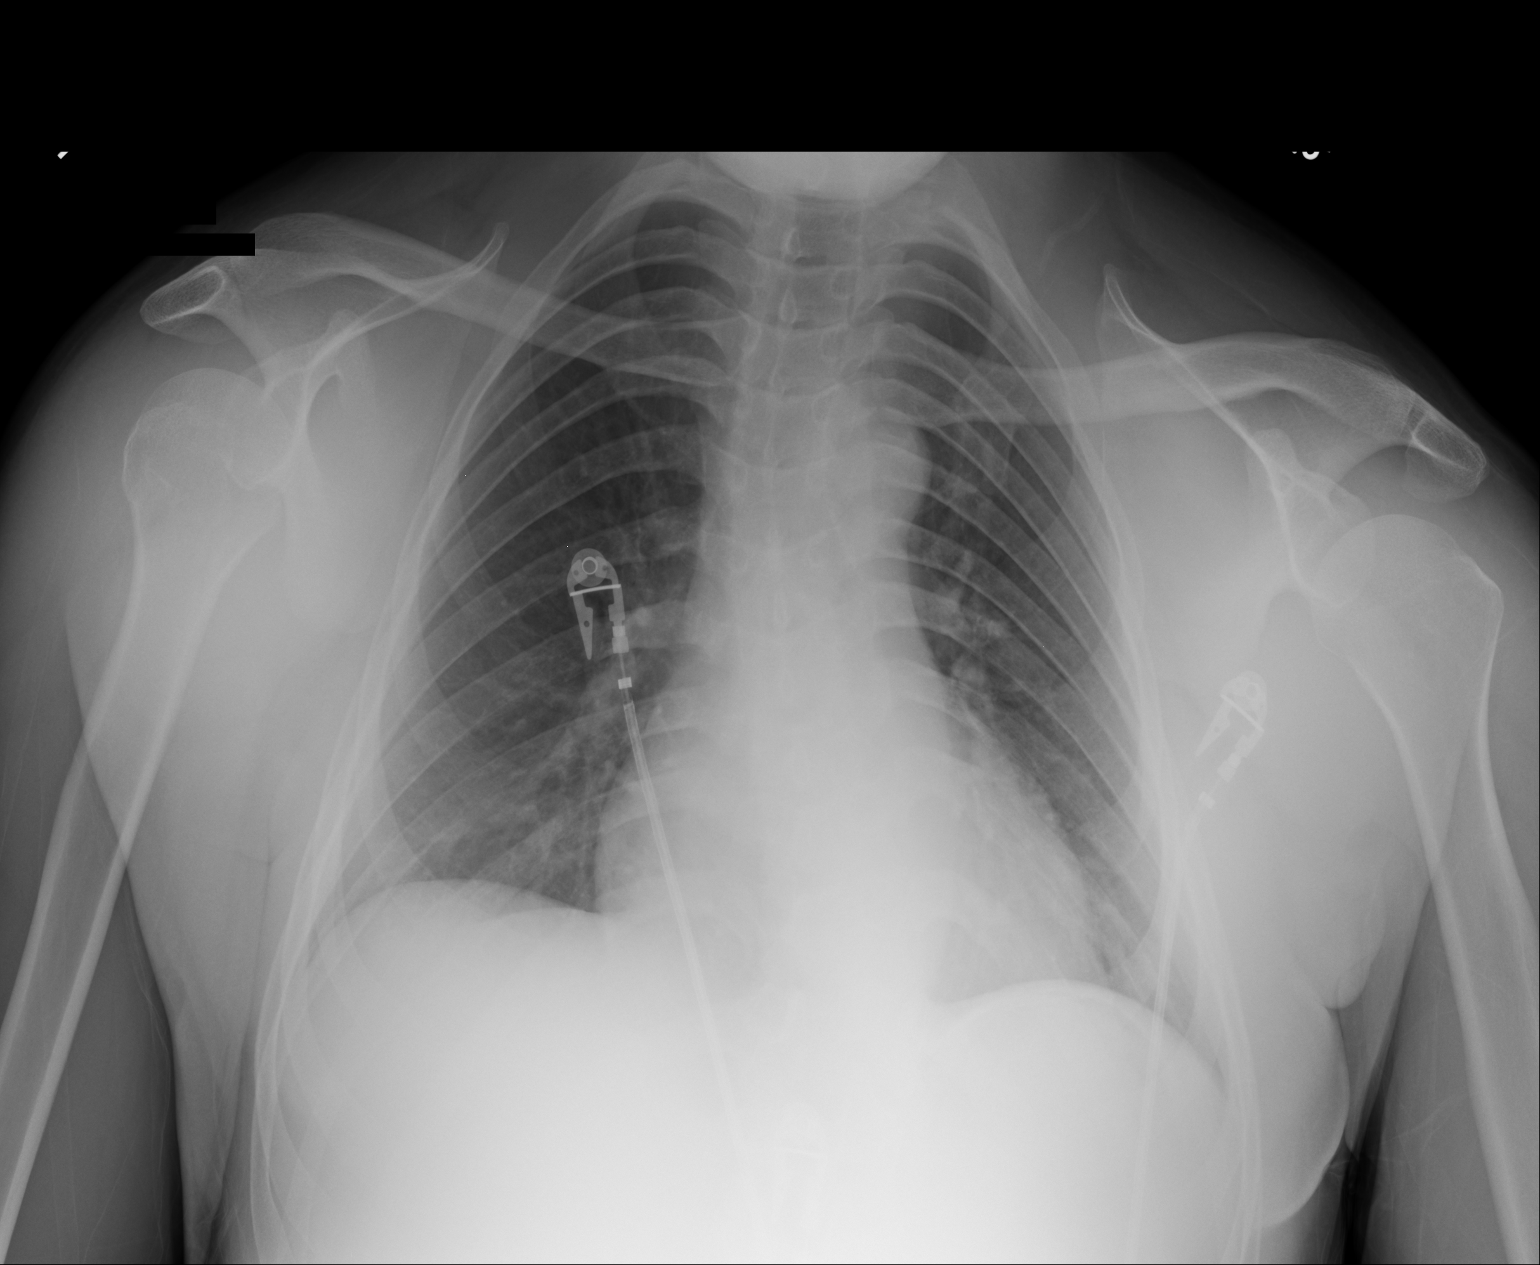

[1 of 1 positions shown; findings below may reference images not displayed]

FINDINGS: The heart size and mediastinal contours are within normal limits.
Both lungs are clear. Moderate thoracolumbar scoliosis.
IMPRESSION: No acute abnormality.  Moderate thoracolumbar scoliosis.

## 2014-09-05 ENCOUNTER — Emergency Department: Payer: Self-pay | Admitting: Emergency Medicine

## 2014-09-05 LAB — COMPREHENSIVE METABOLIC PANEL
ANION GAP: 7 (ref 7–16)
AST: 41 U/L — AB (ref 15–37)
Albumin: 4.4 g/dL (ref 3.4–5.0)
Alkaline Phosphatase: 110 U/L
BILIRUBIN TOTAL: 0.2 mg/dL (ref 0.2–1.0)
BUN: 7 mg/dL (ref 7–18)
CHLORIDE: 108 mmol/L — AB (ref 98–107)
CREATININE: 1.09 mg/dL (ref 0.60–1.30)
Calcium, Total: 8.4 mg/dL — ABNORMAL LOW (ref 8.5–10.1)
Co2: 23 mmol/L (ref 21–32)
GLUCOSE: 93 mg/dL (ref 65–99)
OSMOLALITY: 273 (ref 275–301)
Potassium: 3.9 mmol/L (ref 3.5–5.1)
SGPT (ALT): 55 U/L
Sodium: 138 mmol/L (ref 136–145)
Total Protein: 8.8 g/dL — ABNORMAL HIGH (ref 6.4–8.2)

## 2014-09-05 LAB — CBC
HCT: 49.7 % (ref 40.0–52.0)
HGB: 16.5 g/dL (ref 13.0–18.0)
MCH: 29.4 pg (ref 26.0–34.0)
MCHC: 33.3 g/dL (ref 32.0–36.0)
MCV: 88 fL (ref 80–100)
PLATELETS: 339 10*3/uL (ref 150–440)
RBC: 5.63 10*6/uL (ref 4.40–5.90)
RDW: 13.8 % (ref 11.5–14.5)
WBC: 7.6 10*3/uL (ref 3.8–10.6)

## 2014-09-05 LAB — ETHANOL: Ethanol: 324 mg/dL

## 2014-09-05 LAB — TSH: Thyroid Stimulating Horm: 1.15 u[IU]/mL

## 2014-09-05 LAB — SALICYLATE LEVEL: SALICYLATES, SERUM: 2.6 mg/dL

## 2014-09-05 LAB — ACETAMINOPHEN LEVEL

## 2014-10-03 ENCOUNTER — Emergency Department: Payer: Self-pay | Admitting: Emergency Medicine

## 2014-12-21 ENCOUNTER — Emergency Department: Payer: Self-pay | Admitting: Student

## 2015-03-27 ENCOUNTER — Emergency Department
Admit: 2015-03-27 | Disposition: A | Discharge status: Home / Self Care | Payer: Self-pay | Admitting: Emergency Medicine

## 2015-03-27 LAB — BASIC METABOLIC PANEL
Anion Gap: 9 (ref 7–16)
BUN: 13 mg/dL
CREATININE: 1.14 mg/dL
Calcium, Total: 10.1 mg/dL
Chloride: 101 mmol/L
Co2: 27 mmol/L
EGFR (Non-African Amer.): >60
Glucose: 102 mg/dL — ABNORMAL HIGH
POTASSIUM: 4.4 mmol/L
Sodium: 137 mmol/L

## 2015-03-27 LAB — CBC
HCT: 48.9 % (ref 40.0–52.0)
HGB: 16.1 g/dL (ref 13.0–18.0)
MCH: 30.1 pg (ref 26.0–34.0)
MCHC: 33 g/dL (ref 32.0–36.0)
MCV: 91 fL (ref 80–100)
PLATELETS: 202 10*3/uL (ref 150–440)
RBC: 5.36 10*6/uL (ref 4.40–5.90)
RDW: 13.3 % (ref 11.5–14.5)
WBC: 11.8 10*3/uL — AB (ref 3.8–10.6)

## 2015-10-26 ENCOUNTER — Ambulatory Visit
Admission: RE | Admit: 2015-10-26 | Discharge: 2015-10-26 | Disposition: A | Discharge status: Home / Self Care | Payer: Disability Insurance | Source: Ambulatory Visit

## 2015-10-26 ENCOUNTER — Other Ambulatory Visit: Payer: Self-pay

## 2015-10-26 DIAGNOSIS — M545 Low back pain: Secondary | ICD-10-CM

## 2015-11-09 ENCOUNTER — Emergency Department
Admission: EM | Admit: 2015-11-09 | Discharge: 2015-11-09 | Disposition: A | Discharge status: Home / Self Care | Payer: Medicaid Other | Attending: Emergency Medicine | Admitting: Emergency Medicine

## 2015-11-09 ENCOUNTER — Emergency Department: Payer: Medicaid Other

## 2015-11-09 ENCOUNTER — Encounter: Payer: Self-pay | Admitting: Medical Oncology

## 2015-11-09 DIAGNOSIS — Z79899 Other long term (current) drug therapy: Secondary | ICD-10-CM | POA: Diagnosis not present

## 2015-11-09 DIAGNOSIS — Y998 Other external cause status: Secondary | ICD-10-CM | POA: Diagnosis not present

## 2015-11-09 DIAGNOSIS — X58XXXA Exposure to other specified factors, initial encounter: Secondary | ICD-10-CM | POA: Insufficient documentation

## 2015-11-09 DIAGNOSIS — S62635A Displaced fracture of distal phalanx of left ring finger, initial encounter for closed fracture: Secondary | ICD-10-CM | POA: Insufficient documentation

## 2015-11-09 DIAGNOSIS — Y9389 Activity, other specified: Secondary | ICD-10-CM | POA: Insufficient documentation

## 2015-11-09 DIAGNOSIS — S6992XA Unspecified injury of left wrist, hand and finger(s), initial encounter: Secondary | ICD-10-CM | POA: Diagnosis present

## 2015-11-09 DIAGNOSIS — Y9289 Other specified places as the place of occurrence of the external cause: Secondary | ICD-10-CM | POA: Insufficient documentation

## 2015-11-09 DIAGNOSIS — S62609A Fracture of unspecified phalanx of unspecified finger, initial encounter for closed fracture: Secondary | ICD-10-CM

## 2015-11-09 DIAGNOSIS — F17210 Nicotine dependence, cigarettes, uncomplicated: Secondary | ICD-10-CM | POA: Insufficient documentation

## 2015-11-09 DIAGNOSIS — I10 Essential (primary) hypertension: Secondary | ICD-10-CM | POA: Diagnosis not present

## 2015-11-09 HISTORY — DX: Essential (primary) hypertension: I10

## 2015-11-09 MED ORDER — IBUPROFEN 800 MG PO TABS: 800.0000 mg | ORAL_TABLET | Freq: Three times a day (TID) | ORAL | Status: DC | PRN

## 2015-11-09 MED ORDER — HYDROCODONE-ACETAMINOPHEN 5-325 MG PO TABS: 1.0000 | ORAL_TABLET | ORAL | Status: DC | PRN

## 2015-11-09 MED ORDER — HYDROCODONE-ACETAMINOPHEN 5-325 MG PO TABS
2.0000 | ORAL_TABLET | Freq: Once | ORAL | Status: AC
  Administered 2015-11-09: 2 via ORAL
  Filled 2015-11-09: qty 2

## 2015-11-09 MED ORDER — IBUPROFEN 800 MG PO TABS
800.0000 mg | ORAL_TABLET | Freq: Once | ORAL | Status: AC
  Administered 2015-11-09: 800 mg via ORAL
  Filled 2015-11-09: qty 1

## 2015-11-09 NOTE — ED Notes (Signed)
Left hand ring finger swelling x 3 days.

## 2015-11-09 NOTE — ED Provider Notes (Signed)
[facility] Emergency Department Provider Note  ____________________________________________  Time seen: Approximately 5:10 PM  I have reviewed the triage vital signs and the nursing notes.   HISTORY  Chief Complaint Finger Injury    HPI Fred [name] is a 39 y.o. male who presents for evaluation of left hand fourth finger swelling 3 days. States that the pain radiates down to his entire hand. Doesn't know if it's" broken or sprain".He states his pain as a 10 over 10 years is actively talking on the phone and will stop talking 1 in the room examining him. Desires something for pain.   Past Medical History  Diagnosis Date  . Scoliosis   . Medical history non-contributory   . Hypertension     Patient Active Problem List   Diagnosis Date Noted  . Adjustment disorder with mixed anxiety and depressed mood 02/14/2013    Past Surgical History  Procedure Laterality Date  . No past surgeries      Current Outpatient Rx  Name  Route  Sig  Dispense  Refill  . HYDROcodone-acetaminophen (NORCO) 5-325 MG tablet   Oral   Take 1-2 tablets by mouth every 4 (four) hours as needed for moderate pain.   15 tablet   0   . ibuprofen (ADVIL,MOTRIN) 800 MG tablet   Oral   Take 1 tablet (800 mg total) by mouth every 8 (eight) hours as needed.   30 tablet   0   . lisinopril (PRINIVIL,ZESTRIL) 10 MG tablet   Oral   Take 1 tablet (10 mg total) by mouth daily. For hypertension   30 tablet   1   . traZODone (DESYREL) 50 MG tablet      Take 1 tablet at bedtime if needed for sleep   30 tablet   0     Allergies Review of patient's allergies indicates no known allergies.  No family history on file.  Social History Social History  Substance Use Topics  . Smoking status: Current Every Day Smoker -- 0.50 packs/day for 20 years    Types: Cigarettes  . Smokeless tobacco: None  . Alcohol Use: Yes     Comment: beer and liquor 5 days weekly    Review of  Systems Constitutional: No fever/chills Eyes: No visual changes. ENT: No sore throat. Cardiovascular: Denies chest pain. Respiratory: Denies shortness of breath. Gastrointestinal: No abdominal pain.  No nausea, no vomiting.  No diarrhea.  No constipation. Genitourinary: Negative for dysuria. Musculoskeletal: Positive for left hand fourth finger pain Skin: Negative for rash. Neurological: Negative for headaches, focal weakness or numbness.  10-point ROS otherwise negative.  ____________________________________________   PHYSICAL EXAM:  VITAL SIGNS: ED Triage Vitals  Enc Vitals Group     BP 11/09/15 1641 195/102 mmHg     Pulse Rate 11/09/15 1641 62     Resp 11/09/15 1641 18     Temp 11/09/15 1641 97.9 F (36.6 C)     Temp Source 11/09/15 1641 Oral     SpO2 11/09/15 1641 100 %     Weight 11/09/15 1641 153 lb (69.4 kg)     Height 11/09/15 1641 5' (1.524 m)     Head Cir --      Peak Flow --      Pain Score 11/09/15 1642 10     Pain Loc --      Pain Edu? --      Excl. in GC? --     Constitutional: Alert and oriented. Well appearing  and in no acute distress.   Cardiovascular: Normal rate, regular rhythm. Grossly normal heart sounds.  Good peripheral circulation. Respiratory: Normal respiratory effort.  No retractions. Lungs CTAB. Musculoskeletal: No lower extremity tenderness nor edema.  No joint effusions. Neurologic:  Normal speech and language. No gross focal neurologic deficits are appreciated. No gait instability. Skin:  Skin is warm, dry and intact. No rash noted. Psychiatric: Mood and affect are normal. Speech and behavior are normal.  ____________________________________________   LABS (all labs ordered are listed, but only abnormal results are displayed)  Labs Reviewed - No data to display ____________________________________________   RADIOLOGY  Positive for proximal base of the distal fourth phalanx and avulsion fracture of the base of the fifth  metacarpal ____________________________________________   PROCEDURES  Procedure(s) performed: None  Critical Care performed: No  ____________________________________________   INITIAL IMPRESSION / ASSESSMENT AND PLAN / ED COURSE  Pertinent labs & imaging results that were available during my care of the patient were reviewed by me and considered in my medical decision making (see chart for details).  Multiple fractures of the left hand. Volarsplint applied and patient to follow up with Dr. Ernest Pineten orthopedics on call. Patient voices no other emergency medical complaints at this time. Rx given for Norco 5/325 and Motrin 800 mg 3 times a day. ____________________________________________   FINAL CLINICAL IMPRESSION(S) / ED DIAGNOSES  Final diagnoses:  Finger fracture, left, closed, initial encounter      Evangeline Dakin, PA-C 11/09/15 1748  Phineas Semen, MD 11/09/15 1753

## 2015-11-09 NOTE — Discharge Instructions (Signed)
Finger Fracture Fractures of fingers are breaks in the bones of the fingers. There are many types of fractures. There are different ways of treating these fractures. Your health care provider will discuss the best way to treat your fracture. CAUSES Traumatic injury is the main cause of broken fingers. These include:  Injuries while playing sports.  Workplace injuries.  Falls. RISK FACTORS Activities that can increase your risk of finger fractures include:  Sports.  Workplace activities that involve machinery.  A condition called osteoporosis, which can make your bones less dense and cause them to fracture more easily. SIGNS AND SYMPTOMS The main symptoms of a broken finger are pain and swelling within 15 minutes after the injury. Other symptoms include:  Bruising of your finger.  Stiffness of your finger.  Numbness of your finger.  Exposed bones (compound fracture) if the fracture is severe. DIAGNOSIS  The best way to diagnose a broken bone is with X-ray imaging. Additionally, your health care provider will use this X-ray image to evaluate the position of the broken finger bones.  TREATMENT  Finger fractures can be treated with:   Nonreduction--This means the bones are in place. The finger is splinted without changing the positions of the bone pieces. The splint is usually left on for about a week to 10 days. This will depend on your fracture and what your health care provider thinks.  Closed reduction--The bones are put back into position without using surgery. The finger is then splinted.  Open reduction and internal fixation--The fracture site is opened. Then the bone pieces are fixed into place with pins or some type of hardware. This is seldom required. It depends on the severity of the fracture. HOME CARE INSTRUCTIONS   Follow your health care provider's instructions regarding activities, exercises, and physical therapy.  Only take over-the-counter or prescription  medicines for pain, discomfort, or fever as directed by your health care provider. SEEK MEDICAL CARE IF: You have pain or swelling that limits the motion or use of your fingers. SEEK IMMEDIATE MEDICAL CARE IF:  Your finger becomes numb. MAKE SURE YOU:   Understand these instructions.  Will watch your condition.  Will get help right away if you are not doing well or get worse.   This information is not intended to replace advice given to you by your health care provider. Make sure you discuss any questions you have with your health care provider.   Document Released: 03/12/2001 Document Revised: 09/18/2013 Document Reviewed: 07/10/2013 Elsevier Interactive Patient Education 2016 [place] or Splint Care Casts and splints support injured limbs and keep bones from moving while they heal.  HOME CARE  Keep the cast or splint uncovered during the drying period.  A plaster cast can take 24 to 48 hours to dry.  A fiberglass cast will dry in less than 1 hour.  Do not rest the cast on anything harder than a pillow for 24 hours.  Do not put weight on your injured limb. Do not put pressure on the cast. Wait for your doctor's approval.  Keep the cast or splint dry.  Cover the cast or splint with a plastic bag during baths or wet weather.  If you have a cast over your chest and belly (trunk), take sponge baths until the cast is taken off.  If your cast gets wet, dry it with a towel or blow dryer. Use the cool setting on the blow dryer.  Keep your cast or splint clean. Wash a  dirty cast with a damp cloth. °· Do not put any objects under your cast or splint. °· Do not scratch the skin under the cast with an object. If itching is a problem, use a blow dryer on a cool setting over the itchy area. °· Do not trim or cut your cast. °· Do not take out the padding from inside your cast. °· Exercise your joints near the cast as told by your doctor. °· Raise (elevate) your injured limb on 1  or 2 pillows for the first 1 to 3 days. °GET HELP IF: °· Your cast or splint cracks. °· Your cast or splint is too tight or too loose. °· You itch badly under the cast. °· Your cast gets wet or has a soft spot. °· You have a bad smell coming from the cast. °· You get an object stuck under the cast. °· Your skin around the cast becomes red or sore. °· You have new or more pain after the cast is put on. °GET HELP RIGHT AWAY IF: °· You have fluid leaking through the cast. °· You cannot move your fingers or toes. °· Your fingers or toes turn blue or white or are cool, painful, or puffy (swollen). °· You have tingling or lose feeling (numbness) around the injured area. °· You have bad pain or pressure under the cast. °· You have trouble breathing or have shortness of breath. °· You have chest pain. °  °This information is not intended to replace advice given to you by your health care provider. Make sure you discuss any questions you have with your health care provider. °  °Document Released: 03/30/2011 Document Revised: 07/31/2013 Document Reviewed: 06/06/2013 °Elsevier Interactive Patient Education ©2016 Elsevier Inc. ° °

## 2016-08-18 ENCOUNTER — Encounter: Payer: Self-pay | Admitting: *Deleted

## 2016-08-18 DIAGNOSIS — Z5181 Encounter for therapeutic drug level monitoring: Secondary | ICD-10-CM | POA: Insufficient documentation

## 2016-08-18 DIAGNOSIS — I10 Essential (primary) hypertension: Secondary | ICD-10-CM | POA: Insufficient documentation

## 2016-08-18 DIAGNOSIS — F17210 Nicotine dependence, cigarettes, uncomplicated: Secondary | ICD-10-CM | POA: Insufficient documentation

## 2016-08-18 DIAGNOSIS — F10120 Alcohol abuse with intoxication, uncomplicated: Secondary | ICD-10-CM | POA: Insufficient documentation

## 2016-08-18 DIAGNOSIS — F329 Major depressive disorder, single episode, unspecified: Secondary | ICD-10-CM | POA: Diagnosis not present

## 2016-08-18 DIAGNOSIS — F4321 Adjustment disorder with depressed mood: Secondary | ICD-10-CM | POA: Insufficient documentation

## 2016-08-18 NOTE — ED Triage Notes (Signed)
Pt brought in by [facility].  Pt reports he threatened to hurt himself tonight.  Pt reports SI and HI.  Pt denies drugs or ETOh use.  Pt calm and cooperative.

## 2016-08-19 ENCOUNTER — Emergency Department
Admission: EM | Admit: 2016-08-19 | Discharge: 2016-08-19 | Disposition: A | Discharge status: Home / Self Care | Payer: Medicaid Other | Attending: Emergency Medicine | Admitting: Emergency Medicine

## 2016-08-19 DIAGNOSIS — F4321 Adjustment disorder with depressed mood: Secondary | ICD-10-CM

## 2016-08-19 DIAGNOSIS — F329 Major depressive disorder, single episode, unspecified: Secondary | ICD-10-CM

## 2016-08-19 DIAGNOSIS — F32A Depression, unspecified: Secondary | ICD-10-CM

## 2016-08-19 DIAGNOSIS — F10920 Alcohol use, unspecified with intoxication, uncomplicated: Secondary | ICD-10-CM

## 2016-08-19 LAB — COMPREHENSIVE METABOLIC PANEL
ALBUMIN: 5.1 g/dL — AB (ref 3.5–5.0)
ALT: 40 U/L (ref 17–63)
ANION GAP: 13 (ref 5–15)
AST: 35 U/L (ref 15–41)
Alkaline Phosphatase: 101 U/L (ref 38–126)
BILIRUBIN TOTAL: 0.7 mg/dL (ref 0.3–1.2)
BUN: 10 mg/dL (ref 6–20)
CHLORIDE: 106 mmol/L (ref 101–111)
CO2: 21 mmol/L — AB (ref 22–32)
Calcium: 9.6 mg/dL (ref 8.9–10.3)
Creatinine, Ser: 0.97 mg/dL (ref 0.61–1.24)
GFR calc Af Amer: >60 mL/min (ref >60)
GFR calc non Af Amer: >60 mL/min (ref >60)
GLUCOSE: 95 mg/dL (ref 65–99)
POTASSIUM: 3.6 mmol/L (ref 3.5–5.1)
SODIUM: 140 mmol/L (ref 135–145)
TOTAL PROTEIN: 9.2 g/dL — AB (ref 6.5–8.1)

## 2016-08-19 LAB — URINE DRUG SCREEN, QUALITATIVE (ARMC ONLY)
AMPHETAMINES, UR SCREEN: NOT DETECTED
BENZODIAZEPINE, UR SCRN: NOT DETECTED
Barbiturates, Ur Screen: NOT DETECTED
Cannabinoid 50 Ng, Ur ~~LOC~~: NOT DETECTED
Cocaine Metabolite,Ur ~~LOC~~: NOT DETECTED
MDMA (ECSTASY) UR SCREEN: NOT DETECTED
METHADONE SCREEN, URINE: NOT DETECTED
OPIATE, UR SCREEN: NOT DETECTED
Phencyclidine (PCP) Ur S: NOT DETECTED
Tricyclic, Ur Screen: NOT DETECTED

## 2016-08-19 LAB — CBC
HEMATOCRIT: 51.6 % (ref 40.0–52.0)
Hemoglobin: 17.9 g/dL (ref 13.0–18.0)
MCH: 30.1 pg (ref 26.0–34.0)
MCHC: 34.7 g/dL (ref 32.0–36.0)
MCV: 86.8 fL (ref 80.0–100.0)
PLATELETS: 277 10*3/uL (ref 150–440)
RBC: 5.95 MIL/uL — AB (ref 4.40–5.90)
RDW: 13.7 % (ref 11.5–14.5)
WBC: 8.5 10*3/uL (ref 3.8–10.6)

## 2016-08-19 LAB — ETHANOL: Alcohol, Ethyl (B): 190 mg/dL — ABNORMAL HIGH (ref <5)

## 2016-08-19 LAB — ACETAMINOPHEN LEVEL

## 2016-08-19 LAB — SALICYLATE LEVEL: Salicylate Lvl: <4 mg/dL (ref 2.8–30.0)

## 2016-08-19 NOTE — ED Provider Notes (Signed)
[facility] Emergency Department Provider Note   ____________________________________________   First MD Initiated Contact with Patient 08/19/16 0024     (approximate)  I have reviewed the triage vital signs and the nursing notes.   HISTORY  Chief Complaint Behavior Problem    HPI Fred [name] is a 40 y.o. male brought to the ED by police for threatening to harm himself. Patient has a history of adjustment disorder who has been noncompliant with his medications. Earlier tonight he admit to vague thoughts of self-harm without plan. Currently he denies active SI/HI/AH/VH.Voices no medical complaints. Denies recent fever, chills, chest pain, shortness breath, abdominal pain, nausea, vomiting, diarrhea. Denies recent travel or trauma. Nothing makes his symptoms better or worse.   Past Medical History:  Diagnosis Date  . Hypertension   . Medical history non-contributory   . Scoliosis     Patient Active Problem List   Diagnosis Date Noted  . Adjustment disorder with mixed anxiety and depressed mood 02/14/2013    Past Surgical History:  Procedure Laterality Date  . NO PAST SURGERIES      Prior to Admission medications   Medication Sig Start Date End Date Taking? Authorizing Provider  HYDROcodone-acetaminophen (NORCO) 5-325 MG tablet Take 1-2 tablets by mouth every 4 (four) hours as needed for moderate pain. 11/09/15   Charmayne Sheer Beers, PA-C  ibuprofen (ADVIL,MOTRIN) 800 MG tablet Take 1 tablet (800 mg total) by mouth every 8 (eight) hours as needed. 11/09/15   Charmayne Sheer Beers, PA-C  lisinopril (PRINIVIL,ZESTRIL) 10 MG tablet Take 1 tablet (10 mg total) by mouth daily. For hypertension 02/15/13   Sanjuana Kava, NP  traZODone (DESYREL) 50 MG tablet Take 1 tablet at bedtime if needed for sleep 02/15/13   Sanjuana Kava, NP    Allergies Review of patient's allergies indicates no known allergies.  No family history on file.  Social History Social  History  Substance Use Topics  . Smoking status: Current Every Day Smoker    Packs/day: 0.50    Years: 20.00    Types: Cigarettes  . Smokeless tobacco: Never Used  . Alcohol use Yes     Comment: beer and liquor 5 days weekly    Review of Systems  Constitutional: No fever/chills. Eyes: No visual changes. ENT: No sore throat. Cardiovascular: Denies chest pain. Respiratory: Denies shortness of breath. Gastrointestinal: No abdominal pain.  No nausea, no vomiting.  No diarrhea.  No constipation. Genitourinary: Negative for dysuria. Musculoskeletal: Negative for back pain. Skin: Negative for rash. Neurological: Negative for headaches, focal weakness or numbness. Psychiatric:Positive for depression.  10-point ROS otherwise negative.  ____________________________________________   PHYSICAL EXAM:  VITAL SIGNS: ED Triage Vitals  Enc Vitals Group     BP 08/18/16 2351 (!) 164/106     Pulse Rate 08/18/16 2351 81     Resp 08/18/16 2351 16     Temp 08/18/16 2351 98.4 F (36.9 C)     Temp Source 08/18/16 2351 Oral     SpO2 08/18/16 2351 99 %     Weight 08/18/16 2352 150 lb (68 kg)     Height 08/18/16 2352 5' (1.524 m)     Head Circumference --      Peak Flow --      Pain Score --      Pain Loc --      Pain Edu? --      Excl. in GC? --     Constitutional: Alert and oriented. Well  appearing and in no acute distress. Eyes: Conjunctivae are bloodshot bilaterally. PERRL. EOMI. Head: Atraumatic. Nose: No congestion/rhinnorhea. Mouth/Throat: Mucous membranes are moist.  Oropharynx non-erythematous. Neck: No stridor.   Cardiovascular: Normal rate, regular rhythm. Grossly normal heart sounds.  Good peripheral circulation. Respiratory: Normal respiratory effort.  No retractions. Lungs CTAB. Gastrointestinal: Soft and nontender. No distention. No abdominal bruits. No CVA tenderness. Musculoskeletal: No lower extremity tenderness nor edema.  No joint effusions. Neurologic:  Normal  speech and language. No gross focal neurologic deficits are appreciated. No gait instability. Skin:  Skin is warm, dry and intact. No rash noted. Psychiatric: Mood and affect are normal. Speech and behavior are normal.  ____________________________________________   LABS (all labs ordered are listed, but only abnormal results are displayed)  Labs Reviewed  COMPREHENSIVE METABOLIC PANEL - Abnormal; Notable for the following:       Result Value   CO2 21 (*)    Total Protein 9.2 (*)    Albumin 5.1 (*)    All other components within normal limits  ETHANOL - Abnormal; Notable for the following:    Alcohol, Ethyl (B) 190 (*)    All other components within normal limits  ACETAMINOPHEN LEVEL - Abnormal; Notable for the following:    Acetaminophen (Tylenol), Serum <10 (*)    All other components within normal limits  CBC - Abnormal; Notable for the following:    RBC 5.95 (*)    All other components within normal limits  SALICYLATE LEVEL  URINE DRUG SCREEN, QUALITATIVE ([facility] ONLY)   ____________________________________________  EKG  None ____________________________________________  RADIOLOGY  None ____________________________________________   PROCEDURES  Procedure(s) performed: None  Procedures  Critical Care performed: No  ____________________________________________   INITIAL IMPRESSION / ASSESSMENT AND PLAN / ED COURSE  Pertinent labs & imaging results that were available during my care of the patient were reviewed by me and considered in my medical decision making (see chart for details).  40 year old male with a history of adjustment disorder who presents intoxicated and earlier thoughts of self-harm. Voices no thoughts of self-harm currently. TTS has evaluate patient in the emergency department. [facility] to determine if patient is psychiatrically stable for discharge from the emergency department.  Clinical Course  Comment By Time  [facility]  [facility] Dr[facility] Jonelle SidleBreuer whom I spoke with via telephone. He agrees patient is psychiatrically stable for discharge with referral to an outpatient alcohol program. Strict return precautions given. Patient realizes understanding and agrees with plan of care. Irean HongJade J Sung, MD 09/08 0448     ____________________________________________   FINAL CLINICAL IMPRESSION(S) / ED DIAGNOSES  Final diagnoses:  Alcohol intoxication, uncomplicated (HCC)  Depression  Adjustment disorder with depressed mood      NEW MEDICATIONS STARTED DURING THIS VISIT:  New Prescriptions   No medications on file     Note:  This document was prepared using Dragon voice recognition software and may include unintentional dictation errors.    Irean HongJade J Sung, MD 08/19/16 [phone]

## 2016-08-19 NOTE — ED Notes (Signed)
Pt placed in room 19 [facility]. Pt ambulated to room, in no apparent distress, laying on the stretcher [facility] Pt was informed about how it works and that he will ambulated back to [facility]. Pt verbalized understanding.

## 2016-08-19 NOTE — ED Notes (Signed)
Pt moved to room 19 to complete SOC.

## 2016-08-19 NOTE — ED Notes (Signed)
Pt finished with SOC, asking to talk to Roxanna. Roxanna called and informed [facility].

## 2016-08-19 NOTE — BH Assessment (Signed)
Assessment Note  Fred [name] is an 40 y.o. male presenting to the ED via BPD with endorsing suicidal ideations with no plan or intent, depression and alcohol intoxication.  Pt reports he was feeling lonely and started vague suicidal thoughts with no plan or intent.  He reports that he feels like he has no one to talk to during these times of loneliness.  He states that he has not been taking his medications for depression or hypertension.  He has also not followed up with locating a medical or mental health provider.  Pt denies SI/HI and says that he is feeling better now.  He denies any auditory/visual hallucinations.  Pt denies drug/alcohol use although his BAC was 190.  Diagnosis: Depression  Past Medical History:  Past Medical History:  Diagnosis Date  . Hypertension   . Medical history non-contributory   . Scoliosis     Past Surgical History:  Procedure Laterality Date  . NO PAST SURGERIES      Family History: No family history on file.  Social History:  reports that he has been smoking Cigarettes.  He has a 10.00 pack-year smoking history. He has never used smokeless tobacco. He reports that he drinks alcohol. He reports that he uses drugs, including Marijuana, about 6 times per week.  Additional Social History:  Alcohol / Drug Use History of alcohol / drug use?: Yes Substance #1 Name of Substance 1: alcohol 1 - Age of First Use: 17 1 - Amount (size/oz): varies 1 - Frequency: varies 1 - Duration: varies 1 - Last Use / Amount: 08/18/16 Substance #2 Name of Substance 2: cannabis 2 - Age of First Use: 17 2 - Amount (size/oz): "blunt' 2 - Frequency: varies 2 - Duration: varies 2 - Last Use / Amount: can't remember  CIWA: CIWA-Ar BP: (!) 164/106 Pulse Rate: 81 COWS:    Allergies: No Known Allergies  Home Medications:  (Not in a hospital admission)  OB/GYN Status:  No LMP for male patient.  General Assessment Data Location of Assessment: [facility] ED TTS  Assessment: In system Is this a Tele or Face-to-Face Assessment?: Face-to-Face Is this an Initial Assessment or a Re-assessment for this encounter?: Initial Assessment Marital status: Single Maiden name: n/a Is patient pregnant?: No Pregnancy Status: No Living Arrangements: Alone Can pt return to current living arrangement?: Yes Admission Status: Voluntary Is patient capable of signing voluntary admission?: Yes Referral Source: Self/Family/Friend Insurance type: Medicaid  Medical Screening Exam [facility] Walk-in ONLY) Medical Exam completed: Yes  Crisis Care Plan Living Arrangements: Alone Legal Guardian: Other: (self) Name of Psychiatrist: none reported Name of Therapist: none reported  Education Status Is patient currently in school?: No Current Grade: na Highest grade of school patient has completed: 12 Name of school: na Contact person: na  Risk to self with the past 6 months Suicidal Ideation: Yes-Currently Present Has patient been a risk to self within the past 6 months prior to admission? : No Suicidal Intent: No Has patient had any suicidal intent within the past 6 months prior to admission? : No Is patient at risk for suicide?: No Suicidal Plan?: No Has patient had any suicidal plan within the past 6 months prior to admission? : No Access to Means: No What has been your use of drugs/alcohol within the last 12 months?: alcohol Previous Attempts/Gestures: No How many times?: 0 Other Self Harm Risks: None identified Triggers for Past Attempts: None known Intentional Self Injurious Behavior: None Family Suicide History: No Recent stressful life  event(s): Other (Comment) Persecutory voices/beliefs?: No Depression: Yes Depression Symptoms: Loss of interest in usual pleasures, Feeling worthless/self pity, Isolating Substance abuse history and/or treatment for substance abuse?: No Suicide prevention information given to non-admitted patients: Not applicable  Risk to  Others within the past 6 months Homicidal Ideation: No Does patient have any lifetime risk of violence toward others beyond the six months prior to admission? : No Thoughts of Harm to Others: No Current Homicidal Intent: No Current Homicidal Plan: No Access to Homicidal Means: No Identified Victim: None identified History of harm to others?: No Assessment of Violence: None Noted Violent Behavior Description: none identified Does patient have access to weapons?: No Criminal Charges Pending?: No Does patient have a court date: No Is patient on probation?: No  Psychosis Hallucinations: None noted Delusions: None noted  Mental Status Report Appearance/Hygiene: In scrubs Eye Contact: Good Motor Activity: Freedom of movement Speech: Logical/coherent Level of Consciousness: Alert Mood: Depressed Affect: Depressed Anxiety Level: None Thought Processes: Coherent, Relevant Judgement: Partial Orientation: Person, Place, Time, Situation Obsessive Compulsive Thoughts/Behaviors: None  Cognitive Functioning Concentration: Normal Memory: Recent Intact, Remote Intact IQ: Average Insight: Fair Impulse Control: Fair Appetite: Good Weight Loss: 0 Weight Gain: 0 Sleep: No Change Total Hours of Sleep: 6 Vegetative Symptoms: None  ADLScreening [facility] Assessment Services) Patient's cognitive ability adequate to safely complete daily activities?: Yes Patient able to express need for assistance with ADLs?: Yes Independently performs ADLs?: Yes (appropriate for developmental age)  Prior Inpatient Therapy Prior Inpatient Therapy: Yes Prior Therapy Dates: 2013 [facility] Reason for Treatment: depression  Prior Outpatient Therapy Prior Outpatient Therapy: Yes Prior Therapy Dates: 2013 Prior Therapy Facilty/Provider(s): [facility] Reason for Treatment: depression Does patient have an ACCT team?: No Does patient have Intensive In-House Services?  : No Does  patient have [facility] services? : No Does patient have P4CC services?: No  ADL Screening (condition at time of admission) Patient's cognitive ability adequate to safely complete daily activities?: Yes Patient able to express need for assistance with ADLs?: Yes Independently performs ADLs?: Yes (appropriate for developmental age)       Abuse/Neglect Assessment (Assessment to be complete while patient is alone) Physical Abuse: Denies Verbal Abuse: Denies Sexual Abuse: Denies Exploitation of patient/patient's resources: Denies Self-Neglect: Denies Values / Beliefs Cultural Requests During Hospitalization: None Spiritual Requests During Hospitalization: None Consults Spiritual Care Consult Needed: No Social Work Consult Needed: No Merchant navy officerAdvance Directives (For Healthcare) Does patient have an advance directive?: No    Additional Information 1:1 In Past 12 Months?: No CIRT Risk: No Elopement Risk: No Does patient have medical clearance?: Yes     Disposition:  Disposition Initial Assessment Completed for this Encounter: Yes Disposition of Patient: Other dispositions Other disposition(s): Other (Comment) ([facility])  On Site Evaluation by:   Reviewed with Physician:    Borna Wessinger C Khamila Bassinger 08/19/2016 4:02 AM

## 2016-08-19 NOTE — ED Notes (Signed)
SOC MD talking to patient at this time.

## 2016-08-19 NOTE — Discharge Instructions (Signed)
Schedule an appointment with a therapist at the number provided. Return to the ER for worsening symptoms, feelings of hurting herself or others, or other concerns.

## 2016-08-19 NOTE — ED Notes (Signed)
Discharge instructions reviewed with patient. Questions fielded by this RN. Patient verbalizes understanding of instructions. Patient discharged home in stable condition per Dolores FrameSung MD . No acute distress noted at time of discharge.  Pt denies SI and HI.

## 2017-06-28 ENCOUNTER — Emergency Department: Payer: Medicaid Other

## 2017-06-28 ENCOUNTER — Encounter: Payer: Self-pay | Admitting: Emergency Medicine

## 2017-06-28 ENCOUNTER — Emergency Department
Admission: EM | Admit: 2017-06-28 | Discharge: 2017-06-28 | Disposition: A | Discharge status: Home / Self Care | Payer: Medicaid Other | Attending: Emergency Medicine | Admitting: Emergency Medicine

## 2017-06-28 DIAGNOSIS — M25572 Pain in left ankle and joints of left foot: Secondary | ICD-10-CM

## 2017-06-28 DIAGNOSIS — F17210 Nicotine dependence, cigarettes, uncomplicated: Secondary | ICD-10-CM | POA: Insufficient documentation

## 2017-06-28 DIAGNOSIS — I10 Essential (primary) hypertension: Secondary | ICD-10-CM | POA: Insufficient documentation

## 2017-06-28 DIAGNOSIS — Z79899 Other long term (current) drug therapy: Secondary | ICD-10-CM | POA: Diagnosis not present

## 2017-06-28 MED ORDER — NAPROXEN 500 MG PO TABS
500.0000 mg | ORAL_TABLET | Freq: Once | ORAL | Status: AC
  Administered 2017-06-28: 500 mg via ORAL
  Filled 2017-06-28: qty 1

## 2017-06-28 MED ORDER — NAPROXEN 500 MG PO TABS
500.0000 mg | ORAL_TABLET | Freq: Two times a day (BID) | ORAL | 0 refills | Status: DC
Start: 2017-06-28 — End: 2018-12-14

## 2017-06-28 NOTE — ED Triage Notes (Signed)
Pt presents via EMS with deformed and painful left ankle. Pt broke the ankle a couple of years ago, and the deformity is baseline. Pt states that it hurts off and on, but since yesterday it has been constant and worse. Pt states it is painful to walk. EMS reports that pt walked to their truck from his home. Pt states his pain is 9.5/10. Pt alert & oriented with NAD noted.

## 2017-06-28 NOTE — Discharge Instructions (Signed)
Follow up with the primary care provider of your choice for symptoms that are not improving over the week °Return to the ER for symptoms that change or worsen if unable to schedule an appointment. °

## 2017-07-01 NOTE — ED Provider Notes (Signed)
[facility] Emergency Department Provider Note ____________________________________________  Time seen: Approximately 03:25 PM  I have reviewed the triage vital signs and the nursing notes.   HISTORY  Chief Complaint Ankle Pain    HPI Fred [name] is a 41 y.o. male who presents to the ER for evaluation and treatment of left ankle pain.No recent injury. He states he fractured the ankle a couple of years ago and it has been more painful the past few days. He has not taken any medications for his pain or attempted any other alleviating measures.   Past Medical History:  Diagnosis Date  . Hypertension   . Medical history non-contributory   . Scoliosis     Patient Active Problem List   Diagnosis Date Noted  . Adjustment disorder with mixed anxiety and depressed mood 02/14/2013    Past Surgical History:  Procedure Laterality Date  . NO PAST SURGERIES      Prior to Admission medications   Medication Sig Start Date End Date Taking? Authorizing Provider  HYDROcodone-acetaminophen (NORCO) 5-325 MG tablet Take 1-2 tablets by mouth every 4 (four) hours as needed for moderate pain. 11/09/15   Beers, Charmayne Sheerharles M, PA-C  ibuprofen (ADVIL,MOTRIN) 800 MG tablet Take 1 tablet (800 mg total) by mouth every 8 (eight) hours as needed. 11/09/15   Beers, Charmayne Sheerharles M, PA-C  lisinopril (PRINIVIL,ZESTRIL) 10 MG tablet Take 1 tablet (10 mg total) by mouth daily. For hypertension 02/15/13   Armandina StammerNwoko, Agnes I, NP  naproxen (NAPROSYN) 500 MG tablet Take 1 tablet (500 mg total) by mouth 2 (two) times daily with a meal. 06/28/17   Ameah Chanda B, FNP  traZODone (DESYREL) 50 MG tablet Take 1 tablet at bedtime if needed for sleep 02/15/13   Armandina StammerNwoko, Agnes I, NP    Allergies Patient has no known allergies.  History reviewed. No pertinent family history.  Social History Social History  Substance Use Topics  . Smoking status: Current Every Day Smoker    Years: 20.00    Types: Cigarettes   . Smokeless tobacco: Never Used  . Alcohol use 0.6 oz/week    1 Cans of beer per week    Review of Systems Constitutional: Negative for recent illness or injury. Cardiovascular: Negative for change in skin temperature or color Respiratory: Negative for chest pain or shortness of breath. Musculoskeletal: Positive for left ankle pain. Skin: Negative for wound, lesion,or rash  Neurological: Negative for paresthesias or numbness.  ____________________________________________   PHYSICAL EXAM:  VITAL SIGNS: ED Triage Vitals  Enc Vitals Group     BP 06/28/17 1424 130/83     Pulse Rate 06/28/17 1424 84     Resp 06/28/17 1424 16     Temp 06/28/17 1424 98.9 F (37.2 C)     Temp Source 06/28/17 1424 Oral     SpO2 06/28/17 1424 95 %     Weight 06/28/17 1428 150 lb (68 kg)     Height 06/28/17 1428 5' (1.524 m)     Head Circumference --      Peak Flow --      Pain Score 06/28/17 1423 10     Pain Loc --      Pain Edu? --      Excl. in GC? --     Constitutional: Alert and oriented. Well appearing and in no acute distress. Eyes: Conjunctivae clear without discharge or erythema.  Head: Atraumatic. Neck: Full, Active ROM Respiratory: Respirations are even and unlabored. Musculoskeletal: Tenderness generalized over the  left ankle without focal tenderness. [place] Ankle rules are negative. Neurologic: Sharp and dull sensation is intact.  Skin: Atraumatic.  Psychiatric: Normal affect and behavior.  ____________________________________________   LABS (all labs ordered are listed, but only abnormal results are displayed)  Labs Reviewed - No data to display ____________________________________________  RADIOLOGY  Left ankle is negative for acute abnormality per radiology. ____________________________________________   PROCEDURES  Procedure(s) performed: ACE bandage applied by ER tech. Patient neurovascularly intact post  application.  ____________________________________________   INITIAL IMPRESSION / ASSESSMENT AND PLAN / ED COURSE  Fred [name] is a 41 y.o. male who presents to the ER for nontraumatic left ankle pain. X-ray and exam both consistent with sprain or strain. ACE bandage applied and RICE instructions given to the patient. He is to follow up with his primary care provider or orthopedics for symptoms that are not improving with Naproxen. He is to return to the ER for symptoms that change or worsen if unable to schedule an appointment.  Pertinent labs & imaging results that were available during my care of the patient were reviewed by me and considered in my medical decision making (see chart for details).  _________________________________________   FINAL CLINICAL IMPRESSION(S) / ED DIAGNOSES  Final diagnoses:  Acute left ankle pain    Discharge Medication List as of 06/28/2017  3:55 PM    START taking these medications   Details  naproxen (NAPROSYN) 500 MG tablet Take 1 tablet (500 mg total) by mouth 2 (two) times daily with a meal., Starting Wed 06/28/2017, Print        If controlled substance prescribed during this visit, 12 month history viewed on the [facility] prior to issuing an initial prescription for Schedule II or III opiod.    Chinita Pester, FNP 07/01/17 1316    Minna Antis, MD 07/03/17 2221

## 2018-12-14 ENCOUNTER — Emergency Department
Admission: EM | Admit: 2018-12-14 | Discharge: 2018-12-14 | Disposition: A | Discharge status: Other Acute Inpatient Hospital | Payer: Medicaid Other | Attending: Emergency Medicine | Admitting: Emergency Medicine

## 2018-12-14 ENCOUNTER — Inpatient Hospital Stay
Admission: AD | Admit: 2018-12-14 | Discharge: 2018-12-17 | DRG: 882 | Disposition: A | Discharge status: Home / Self Care | Payer: Medicaid Other | Source: Intra-hospital | Attending: Psychiatry | Admitting: Psychiatry

## 2018-12-14 ENCOUNTER — Encounter: Payer: Self-pay | Admitting: *Deleted

## 2018-12-14 ENCOUNTER — Other Ambulatory Visit: Payer: Self-pay

## 2018-12-14 DIAGNOSIS — F4323 Adjustment disorder with mixed anxiety and depressed mood: Secondary | ICD-10-CM | POA: Diagnosis present

## 2018-12-14 DIAGNOSIS — R45851 Suicidal ideations: Secondary | ICD-10-CM | POA: Diagnosis present

## 2018-12-14 DIAGNOSIS — F1010 Alcohol abuse, uncomplicated: Secondary | ICD-10-CM

## 2018-12-14 DIAGNOSIS — F17210 Nicotine dependence, cigarettes, uncomplicated: Secondary | ICD-10-CM | POA: Diagnosis present

## 2018-12-14 DIAGNOSIS — Z79899 Other long term (current) drug therapy: Secondary | ICD-10-CM | POA: Insufficient documentation

## 2018-12-14 DIAGNOSIS — F10239 Alcohol dependence with withdrawal, unspecified: Secondary | ICD-10-CM | POA: Diagnosis present

## 2018-12-14 DIAGNOSIS — Z23 Encounter for immunization: Secondary | ICD-10-CM | POA: Diagnosis not present

## 2018-12-14 DIAGNOSIS — Z915 Personal history of self-harm: Secondary | ICD-10-CM

## 2018-12-14 DIAGNOSIS — I10 Essential (primary) hypertension: Secondary | ICD-10-CM | POA: Diagnosis present

## 2018-12-14 DIAGNOSIS — F329 Major depressive disorder, single episode, unspecified: Secondary | ICD-10-CM

## 2018-12-14 DIAGNOSIS — E519 Thiamine deficiency, unspecified: Secondary | ICD-10-CM | POA: Diagnosis present

## 2018-12-14 DIAGNOSIS — F4320 Adjustment disorder, unspecified: Secondary | ICD-10-CM | POA: Insufficient documentation

## 2018-12-14 DIAGNOSIS — F4325 Adjustment disorder with mixed disturbance of emotions and conduct: Secondary | ICD-10-CM | POA: Diagnosis not present

## 2018-12-14 DIAGNOSIS — F32A Depression, unspecified: Secondary | ICD-10-CM

## 2018-12-14 DIAGNOSIS — M419 Scoliosis, unspecified: Secondary | ICD-10-CM | POA: Diagnosis present

## 2018-12-14 DIAGNOSIS — F419 Anxiety disorder, unspecified: Secondary | ICD-10-CM | POA: Diagnosis present

## 2018-12-14 LAB — COMPREHENSIVE METABOLIC PANEL
ALT: 40 U/L (ref 0–44)
AST: 33 U/L (ref 15–41)
Albumin: 5.1 g/dL — ABNORMAL HIGH (ref 3.5–5.0)
Alkaline Phosphatase: 111 U/L (ref 38–126)
Anion gap: 12 (ref 5–15)
BUN: 11 mg/dL (ref 6–20)
CO2: 19 mmol/L — ABNORMAL LOW (ref 22–32)
Calcium: 9.3 mg/dL (ref 8.9–10.3)
Chloride: 108 mmol/L (ref 98–111)
Creatinine, Ser: 1.09 mg/dL (ref 0.61–1.24)
GFR calc Af Amer: >60 mL/min (ref >60)
GFR calc non Af Amer: >60 mL/min (ref >60)
Glucose, Bld: 92 mg/dL (ref 70–99)
Potassium: 4 mmol/L (ref 3.5–5.1)
Sodium: 139 mmol/L (ref 135–145)
Total Bilirubin: 0.4 mg/dL (ref 0.3–1.2)
Total Protein: 9 g/dL — ABNORMAL HIGH (ref 6.5–8.1)

## 2018-12-14 LAB — CBC
HEMATOCRIT: 53.1 % — AB (ref 39.0–52.0)
HEMOGLOBIN: 17.6 g/dL — AB (ref 13.0–17.0)
MCH: 28.8 pg (ref 26.0–34.0)
MCHC: 33.1 g/dL (ref 30.0–36.0)
MCV: 86.8 fL (ref 80.0–100.0)
NRBC: 0 % (ref 0.0–0.2)
Platelets: 317 10*3/uL (ref 150–400)
RBC: 6.12 MIL/uL — AB (ref 4.22–5.81)
RDW: 13.7 % (ref 11.5–15.5)
WBC: 7.2 10*3/uL (ref 4.0–10.5)

## 2018-12-14 LAB — ETHANOL: Alcohol, Ethyl (B): 267 mg/dL — ABNORMAL HIGH (ref <10)

## 2018-12-14 LAB — SALICYLATE LEVEL: Salicylate Lvl: <7 mg/dL (ref 2.8–30.0)

## 2018-12-14 LAB — ACETAMINOPHEN LEVEL: Acetaminophen (Tylenol), Serum: <10 ug/mL — ABNORMAL LOW (ref 10–30)

## 2018-12-14 MED ORDER — LORAZEPAM 2 MG/ML IJ SOLN: 1.0000 mg | Freq: Four times a day (QID) | INTRAMUSCULAR | Status: DC | PRN

## 2018-12-14 MED ORDER — FOLIC ACID 1 MG PO TABS
1.0000 mg | ORAL_TABLET | Freq: Every day | ORAL | Status: DC
  Administered 2018-12-14: 1 mg via ORAL
  Filled 2018-12-14: qty 1

## 2018-12-14 MED ORDER — ADULT MULTIVITAMIN W/MINERALS CH
1.0000 | ORAL_TABLET | Freq: Every day | ORAL | Status: DC
  Administered 2018-12-14: 1 via ORAL
  Filled 2018-12-14: qty 1

## 2018-12-14 MED ORDER — TRAZODONE HCL 100 MG PO TABS
100.0000 mg | ORAL_TABLET | Freq: Every evening | ORAL | Status: DC | PRN
  Administered 2018-12-15: 100 mg via ORAL
  Filled 2018-12-14: qty 1

## 2018-12-14 MED ORDER — THIAMINE HCL 100 MG/ML IJ SOLN: 100.0000 mg | Freq: Every day | INTRAMUSCULAR | Status: DC

## 2018-12-14 MED ORDER — MAGNESIUM HYDROXIDE 400 MG/5ML PO SUSP: 30.0000 mL | Freq: Every day | ORAL | Status: DC | PRN

## 2018-12-14 MED ORDER — ADULT MULTIVITAMIN W/MINERALS CH
1.0000 | ORAL_TABLET | Freq: Every day | ORAL | Status: DC
  Administered 2018-12-15 – 2018-12-17 (×3): 1 via ORAL
  Filled 2018-12-14 (×3): qty 1

## 2018-12-14 MED ORDER — FOLIC ACID 1 MG PO TABS
1.0000 mg | ORAL_TABLET | Freq: Every day | ORAL | Status: DC
  Administered 2018-12-15 – 2018-12-17 (×3): 1 mg via ORAL
  Filled 2018-12-14 (×3): qty 1

## 2018-12-14 MED ORDER — LORAZEPAM 1 MG PO TABS: 1.0000 mg | ORAL_TABLET | Freq: Four times a day (QID) | ORAL | Status: DC | PRN

## 2018-12-14 MED ORDER — LISINOPRIL 10 MG PO TABS
10.0000 mg | ORAL_TABLET | Freq: Every day | ORAL | Status: DC
  Administered 2018-12-14: 10 mg via ORAL
  Filled 2018-12-14: qty 1

## 2018-12-14 MED ORDER — VITAMIN B-1 100 MG PO TABS
100.0000 mg | ORAL_TABLET | Freq: Every day | ORAL | Status: DC
  Administered 2018-12-15 – 2018-12-17 (×3): 100 mg via ORAL
  Filled 2018-12-14 (×3): qty 1

## 2018-12-14 MED ORDER — HYDROXYZINE HCL 25 MG PO TABS: 25.0000 mg | ORAL_TABLET | Freq: Three times a day (TID) | ORAL | Status: DC | PRN

## 2018-12-14 MED ORDER — LORAZEPAM 1 MG PO TABS
1.0000 mg | ORAL_TABLET | Freq: Four times a day (QID) | ORAL | Status: DC | PRN
  Administered 2018-12-14: 1 mg via ORAL
  Filled 2018-12-14: qty 1

## 2018-12-14 MED ORDER — THIAMINE HCL 100 MG/ML IJ SOLN
100.0000 mg | Freq: Every day | INTRAMUSCULAR | Status: DC
  Filled 2018-12-14 (×3): qty 1

## 2018-12-14 MED ORDER — ALUM & MAG HYDROXIDE-SIMETH 200-200-20 MG/5ML PO SUSP: 30.0000 mL | ORAL | Status: DC | PRN

## 2018-12-14 MED ORDER — LISINOPRIL 10 MG PO TABS
10.0000 mg | ORAL_TABLET | Freq: Every day | ORAL | Status: DC
  Administered 2018-12-15 – 2018-12-17 (×3): 10 mg via ORAL
  Filled 2018-12-14 (×4): qty 1

## 2018-12-14 MED ORDER — VITAMIN B-1 100 MG PO TABS
100.0000 mg | ORAL_TABLET | Freq: Every day | ORAL | Status: DC
  Administered 2018-12-14: 100 mg via ORAL
  Filled 2018-12-14: qty 1

## 2018-12-14 MED ORDER — ACETAMINOPHEN 325 MG PO TABS: 650.0000 mg | ORAL_TABLET | Freq: Four times a day (QID) | ORAL | Status: DC | PRN

## 2018-12-14 NOTE — ED Notes (Signed)
IV[facility] 

## 2018-12-14 NOTE — ED Provider Notes (Signed)
[facility] Emergency Department Provider Note   ____________________________________________   First MD Initiated Contact with Patient 12/14/18 1307     (approximate)  I have reviewed the triage vital signs and the nursing notes.   HISTORY  Chief Complaint Suicidal   HPI Fred [name] is a 43 y.o. male who was complaining of depression and thinking of killing himself by hitting him self in the head with a hammer.  Reportedly he takes multiple people about this.  He has a history of adjustment disorder with anxiety and depressed mood   Past Medical History:  Diagnosis Date  . Hypertension   . Medical history non-contributory   . Scoliosis     Patient Active Problem List   Diagnosis Date Noted  . Adjustment disorder with mixed anxiety and depressed mood 02/14/2013    Past Surgical History:  Procedure Laterality Date  . NO PAST SURGERIES      Prior to Admission medications   Medication Sig Start Date End Date Taking? Authorizing Provider  HYDROcodone-acetaminophen (NORCO) 5-325 MG tablet Take 1-2 tablets by mouth every 4 (four) hours as needed for moderate pain. 11/09/15   Beers, Charmayne Sheer, PA-C  ibuprofen (ADVIL,MOTRIN) 800 MG tablet Take 1 tablet (800 mg total) by mouth every 8 (eight) hours as needed. 11/09/15   Beers, Charmayne Sheer, PA-C  lisinopril (PRINIVIL,ZESTRIL) 10 MG tablet Take 1 tablet (10 mg total) by mouth daily. For hypertension 02/15/13   Armandina Stammer I, NP  naproxen (NAPROSYN) 500 MG tablet Take 1 tablet (500 mg total) by mouth 2 (two) times daily with a meal. 06/28/17   Triplett, Cari B, FNP  traZODone (DESYREL) 50 MG tablet Take 1 tablet at bedtime if needed for sleep 02/15/13   Armandina Stammer I, NP    Allergies Patient has no known allergies.  History reviewed. No pertinent family history.  Social History Social History   Tobacco Use  . Smoking status: Current Every Day Smoker    Years: 20.00    Types: Cigarettes  .  Smokeless tobacco: Never Used  Substance Use Topics  . Alcohol use: Yes    Alcohol/week: 1.0 standard drinks    Types: 1 Cans of beer per week  . Drug use: Yes    Frequency: 1.0 times per week    Types: Marijuana    Review of Systems  Constitutional: No fever/chills Eyes: No visual changes. ENT: No sore throat. Cardiovascular: Denies chest pain. Respiratory: Denies shortness of breath. Gastrointestinal: No abdominal pain.  No nausea, no vomiting.  No diarrhea.  No constipation. Genitourinary: Negative for dysuria. Musculoskeletal: Negative for back pain. Skin: Negative for rash. Neurological: Negative for headaches, focal weakness   ____________________________________________   PHYSICAL EXAM:  VITAL SIGNS: ED Triage Vitals  Enc Vitals Group     BP 12/14/18 1240 (!) 187/121     Pulse Rate 12/14/18 1240 89     Resp 12/14/18 1240 18     Temp 12/14/18 1240 98.1 F (36.7 C)     Temp Source 12/14/18 1240 Oral     SpO2 12/14/18 1240 97 %     Weight --      Height 12/14/18 1252 5' (1.524 m)     Head Circumference --      Peak Flow --      Pain Score 12/14/18 1252 0     Pain Loc --      Pain Edu? --      Excl. in GC? --  Constitutional: Alert and oriented. Well appearing and in no acute distress. Eyes: Conjunctivae are normal.  Head: Atraumatic. Nose: No congestion/rhinnorhea. Mouth/Throat: Mucous membranes are moist.  Oropharynx non-erythematous. Neck: No stridor. Cardiovascular: Normal rate, regular rhythm. Grossly normal heart sounds.  Good peripheral circulation. Respiratory: Normal respiratory effort.  No retractions. Lungs CTAB. Gastrointestinal: Soft and nontender. No distention. No abdominal bruits. No CVA tenderness. Musculoskeletal: No lower extremity tenderness nor edema.   Neurologic:  Normal speech and language. No gross focal neurologic deficits are appreciated.  Skin:  Skin is warm, dry and intact. No rash noted. Psychiatric: Mood and affect are  normal. Speech and behavior are normal.  ____________________________________________   LABS (all labs ordered are listed, but only abnormal results are displayed)  Labs Reviewed  COMPREHENSIVE METABOLIC PANEL - Abnormal; Notable for the following components:      Result Value   CO2 19 (*)    Total Protein 9.0 (*)    Albumin 5.1 (*)    All other components within normal limits  ETHANOL - Abnormal; Notable for the following components:   Alcohol, Ethyl (B) 267 (*)    All other components within normal limits  ACETAMINOPHEN LEVEL - Abnormal; Notable for the following components:   Acetaminophen (Tylenol), Serum <10 (*)    All other components within normal limits  CBC - Abnormal; Notable for the following components:   RBC 6.12 (*)    Hemoglobin 17.6 (*)    HCT 53.1 (*)    All other components within normal limits  SALICYLATE LEVEL  URINE DRUG SCREEN, QUALITATIVE ([facility] ONLY)   ____________________________________________  EKG   ____________________________________________  RADIOLOGY  ED MD interpretation:   Official radiology report(s): No results found.  ____________________________________________   PROCEDURES  Procedure(s) performed:   Procedures  Critical Care performed:   ____________________________________________   INITIAL IMPRESSION / ASSESSMENT AND PLAN / ED COURSE  Patient now says he is not depressed any longer and wants to go.  I talked him into waiting until psychiatry can see him so he does not get depressed again I told him and have to come back.         ____________________________________________   FINAL CLINICAL IMPRESSION(S) / ED DIAGNOSES  Final diagnoses:  Depression, unspecified depression type     ED Discharge Orders    None       Note:  This document was prepared using Dragon voice recognition software and may include unintentional dictation errors.    Arnaldo NatalMalinda, Amaan Meyer F, MD 12/14/18 [phone]

## 2018-12-14 NOTE — ED Notes (Signed)
Pt. Calm and cooperative, laying in bed watching tv.  Pt. States feeling better than he did earlier in day.  Pt. Aware he is to be admitted downstairs, pt. States he has had inpatient in the past.

## 2018-12-14 NOTE — BH Assessment (Signed)
Assessment Note  Fred [name] is an 43 y.o. male who presents to ED endorsing suicidal ideation. Pt states "I was having suicidal thoughts earlier. I sent text to my neighbors that I was thinking about committing suicide. At the time I was feeling depressed". He reports having passive suicidal thoughts in the past, but has never acted on these thoughts. Pt denied current SI/HI/AVH. It appears that pt began to under report when he decided he no longer wanted to be in the ED. He repeatedly stated he does not feel that way anymore and that he wants to go home. He reports recent use of alcohol (2-3 beers) last night. He reports he had these drinks at the time he sent the text messages to his neighbor. Pt did not display any psychotic behaviors during TTS assessment. Pt was alert and oriented x4.  Diagnosis: Adjustment Disorder, unspecified  Past Medical History:  Past Medical History:  Diagnosis Date  . Hypertension   . Medical history non-contributory   . Scoliosis     Past Surgical History:  Procedure Laterality Date  . NO PAST SURGERIES      Family History: History reviewed. No pertinent family history.  Social History:  reports that he has been smoking cigarettes. He has smoked for the past 20.00 years. He has never used smokeless tobacco. He reports current alcohol use of about 1.0 standard drinks of alcohol per week. He reports current drug use. Frequency: 1.00 time per week. Drug: Marijuana.  Additional Social History:  Alcohol / Drug Use Pain Medications: See MAR Prescriptions: See MAR Over the Counter: See MAR History of alcohol / drug use?: Yes Longest period of sobriety (when/how long): Unable to quantify Negative Consequences of Use: Financial, Personal relationships, Work / School Withdrawal Symptoms: (None Reported) Substance #1 Name of Substance 1: Alcohol 1 - Age of First Use: Pt unable to recall 1 - Amount (size/oz): "a couple" 1 - Frequency: Pt unable to  quantify 1 - Duration: Pt unable to quantify 1 - Last Use / Amount: 12/13/2018  CIWA: CIWA-Ar BP: (!) 152/104 Pulse Rate: 89 Nausea and Vomiting: mild nausea with no vomiting Tactile Disturbances: mild itching, pins and needles, burning or numbness Tremor: not visible, but can be felt fingertip to fingertip Auditory Disturbances: not present Paroxysmal Sweats: no sweat visible Visual Disturbances: not present Anxiety: three Headache, Fullness in Head: none present Agitation: somewhat more than normal activity Orientation and Clouding of Sensorium: cannot do serial additions or is uncertain about date CIWA-Ar Total: 9 COWS:    Allergies: No Known Allergies  Home Medications: (Not in a hospital admission)   OB/GYN Status:  No LMP for male patient.  General Assessment Data Location of Assessment: [facility] ED TTS Assessment: In system Is this a Tele or Face-to-Face Assessment?: Face-to-Face Is this an Initial Assessment or a Re-assessment for this encounter?: Initial Assessment Patient Accompanied by:: N/A Language Other than English: No Living Arrangements: Other (Comment) What gender do you identify as?: Male Marital status: Single Maiden name: n/a Pregnancy Status: No Living Arrangements: Alone Can pt return to current living arrangement?: Yes Admission Status: Involuntary Petitioner: ED Attending Is patient capable of signing voluntary admission?: No Referral Source: Self/Family/Friend Insurance type: Medicaid  Medical Screening Exam [facility] Walk-in ONLY) Medical Exam completed: Yes  Crisis Care Plan Living Arrangements: Alone Legal Guardian: Other:(Self) Name of Psychiatrist: None Name of Therapist: None  Education Status Is patient currently in school?: No Is the patient employed, unemployed or receiving disability?: Unemployed  Risk  to self with the past 6 months Suicidal Ideation: No Has patient been a risk to self within the past 6 months prior to admission?  : Yes Suicidal Intent: No Has patient had any suicidal intent within the past 6 months prior to admission? : Yes Is patient at risk for suicide?: Yes Suicidal Plan?: No Has patient had any suicidal plan within the past 6 months prior to admission? : No Access to Means: No What has been your use of drugs/alcohol within the last 12 months?: Alcohol Previous Attempts/Gestures: No How many times?: 0 Other Self Harm Risks: None Reported Triggers for Past Attempts: None known Intentional Self Injurious Behavior: None Family Suicide History: No Recent stressful life event(s): Other (Comment)(Denied any current stressors) Persecutory voices/beliefs?: No Depression: Yes Depression Symptoms: Guilt, Feeling worthless/self pity Substance abuse history and/or treatment for substance abuse?: Yes Suicide prevention information given to non-admitted patients: Not applicable  Risk to Others within the past 6 months Homicidal Ideation: No Does patient have any lifetime risk of violence toward others beyond the six months prior to admission? : No Thoughts of Harm to Others: No Current Homicidal Intent: No Current Homicidal Plan: No Access to Homicidal Means: No Identified Victim: None History of harm to others?: No Assessment of Violence: None Noted Violent Behavior Description: None Does patient have access to weapons?: No Criminal Charges Pending?: No Does patient have a court date: No Is patient on probation?: Unknown  Psychosis Hallucinations: None noted Delusions: None noted  Mental Status Report Appearance/Hygiene: In scrubs Eye Contact: Fair Motor Activity: Freedom of movement Speech: Logical/coherent Level of Consciousness: Alert Mood: Ambivalent Affect: Appropriate to circumstance Anxiety Level: Minimal Thought Processes: Coherent, Relevant Judgement: Unimpaired Orientation: Person, Place, Time, Situation, Appropriate for developmental age Obsessive Compulsive  Thoughts/Behaviors: None  Cognitive Functioning Concentration: Normal Memory: Recent Intact, Remote Intact Is patient IDD: No Insight: Fair Impulse Control: Fair Appetite: Good Have you had any weight changes? : No Change Sleep: No Change Total Hours of Sleep: 8 Vegetative Symptoms: None  ADLScreening [facility] Assessment Services) Patient's cognitive ability adequate to safely complete daily activities?: Yes Patient able to express need for assistance with ADLs?: Yes Independently performs ADLs?: Yes (appropriate for developmental age)  Prior Inpatient Therapy Prior Inpatient Therapy: Yes Prior Therapy Dates: Unable to recall dates Prior Therapy Facilty[facility]Provider[facility] Reason for Treatment: Depression  Prior Outpatient Therapy Prior Outpatient Therapy: No Does patient have an ACCT team?: No Does patient have Intensive In-House Services?  : No Does patient have [facility] services? : No Does patient have P4CC services?: No  ADL Screening (condition at time of admission) Patient's cognitive ability adequate to safely complete daily activities?: Yes Patient able to express need for assistance with ADLs?: Yes Independently performs ADLs?: Yes (appropriate for developmental age)       Abuse/Neglect Assessment (Assessment to be complete while patient is alone) Abuse/Neglect Assessment Can Be Completed: Yes Physical Abuse: Denies Verbal Abuse: Denies Sexual Abuse: Denies Exploitation of patient/patient's resources: Denies Self-Neglect: Denies Values / Beliefs Cultural Requests During Hospitalization: None Spiritual Requests During Hospitalization: None Consults Spiritual Care Consult Needed: No Social Work Consult Needed: No Merchant navy officerAdvance Directives (For Healthcare) Does Patient Have a Medical Advance Directive?: No Would patient like information on creating a medical advance directive?: No - Patient declined       Child/Adolescent Assessment Running Away Risk: (Patient is  an adult)  Disposition:  Disposition Initial Assessment Completed for this Encounter: Yes Disposition of Patient: Admit Type of inpatient treatment program: Adult  Patient refused recommended treatment: No Mode of transportation if patient is discharged/movement?: N/A  On Site Evaluation by:   Reviewed with Physician:    Wilmon ArmsSTEVENSON, Tymira Horkey 12/14/2018 7:15 PM

## 2018-12-14 NOTE — ED Notes (Signed)
Officer brought pt in voluntarily, states pt was texting his neighbor saying he wanted to kill himself, officer states hi as well. Pt taken to triage 3 and Gerilyn Pilgrim NT in with pt and officer.

## 2018-12-14 NOTE — Consult Note (Signed)
[facility]   Reason for Consult: Consult for this 43 year old man brought in by police after they were called because the patient was sending text messages threatening to kill himself Referring Physician: Paduchowski Patient Identification: Fred [name] MRN:  [id number] Principal Diagnosis: Suicidal ideation Diagnosis:  Principal Problem:   Suicidal ideation Active Problems:   Adjustment disorder with mixed anxiety and depressed mood   Alcohol abuse   Hypertension   Total Time spent with patient: 1 hour  Subjective:   Fred HugeRoderick Koudelka is a 43 y.o. male patient admitted with "I wanted to kill myself before but now I feel better".  HPI: Patient seen chart reviewed.  Patient was brought into the emergency room by law enforcement after his neighbors called them.  Patient had been sending out text messages stating that he was going to kill himself.  It is documented that he said he was going to hit himself on the head with a hammer.  On my evaluation this afternoon the patient says he earlier in the day he wanted to kill himself but now he feels much better and wants to go home.  He does not have any rational reason to provide for why he would have wanted to kill himself earlier.  Denies being depressed.  Denies hallucinations.  He denies alcohol and drug abuse completely although his blood alcohol level on presentation was almost 300.  Patient is not receiving any outpatient psychiatric treatment.  He denies any particular stresses in his life.  Social history: Lives by himself.  Estranged from most people.  Seems to spend a lot of his time drinking.  Medical history: History of scoliosis and high blood pressure.  Not clear if he is still taking his blood pressure medicine.  Substance abuse history: Multiple visits to the emergency room with alcohol levels above 200 or even 300 at times.  Patient is minimizing his alcohol use and denies any history of seizures or DTs.   Denies other drug use.  Past Psychiatric History: [place] under what sounded like pretty similar circumstances.  Had made suicidal statements while intoxicated once he sobered up they cleared up.  Does not look like he is ever followed up with outpatient mental health or substance abuse treatment.  Patient denies that he is ever actually tried to kill himself  Risk to Self:   Risk to Others:   Prior Inpatient Therapy:   Prior Outpatient Therapy:    Past Medical History:  Past Medical History:  Diagnosis Date  . Hypertension   . Medical history non-contributory   . Scoliosis     Past Surgical History:  Procedure Laterality Date  . NO PAST SURGERIES     Family History: History reviewed. No pertinent family history. Family Psychiatric  History: Denies any Social History:  Social History   Substance and Sexual Activity  Alcohol Use Yes  . Alcohol/week: 1.0 standard drinks  . Types: 1 Cans of beer per week     Social History   Substance and Sexual Activity  Drug Use Yes  . Frequency: 1.0 times per week  . Types: Marijuana    Social History   Socioeconomic History  . Marital status: Single    Spouse name: Not on file  . Number of children: Not on file  . Years of education: Not on file  . Highest education level: Not on file  Occupational History  . Not on file  Social Needs  . Financial  resource strain: Not on file  . Food insecurity:    Worry: Not on file    Inability: Not on file  . Transportation needs:    Medical: Not on file    Non-medical: Not on file  Tobacco Use  . Smoking status: Current Every Day Smoker    Years: 20.00    Types: Cigarettes  . Smokeless tobacco: Never Used  Substance and Sexual Activity  . Alcohol use: Yes    Alcohol/week: 1.0 standard drinks    Types: 1 Cans of beer per week  . Drug use: Yes    Frequency: 1.0 times per week    Types: Marijuana  . Sexual activity: Yes  Lifestyle  .  Physical activity:    Days per week: Not on file    Minutes per session: Not on file  . Stress: Not on file  Relationships  . Social connections:    Talks on phone: Not on file    Gets together: Not on file    Attends religious service: Not on file    Active member of club or organization: Not on file    Attends meetings of clubs or organizations: Not on file    Relationship status: Not on file  Other Topics Concern  . Not on file  Social History Narrative  . Not on file   Additional Social History:    Allergies:  No Known Allergies  Labs:  Results for orders placed or performed during the hospital encounter of 12/14/18 (from the past 48 hour(s))  Comprehensive metabolic panel     Status: Abnormal   Collection Time: 12/14/18 12:55 PM  Result Value Ref Range   Sodium 139 135 - 145 mmol/L   Potassium 4.0 3.5 - 5.1 mmol/L   Chloride 108 98 - 111 mmol/L   CO2 19 (L) 22 - 32 mmol/L   Glucose, Bld 92 70 - 99 mg/dL   BUN 11 6 - 20 mg/dL   Creatinine, Ser 4.091.09 0.61 - 1.24 mg/dL   Calcium 9.3 8.9 - 81.110.3 mg/dL   Total Protein 9.0 (H) 6.5 - 8.1 g/dL   Albumin 5.1 (H) 3.5 - 5.0 g/dL   AST 33 15 - 41 U/L   ALT 40 0 - 44 U/L   Alkaline Phosphatase 111 38 - 126 U/L   Total Bilirubin 0.4 0.3 - 1.2 mg/dL   GFR calc non Af Amer >60 >60 mL/min   GFR calc Af Amer >60 >60 mL/min   Anion gap 12 5 - 15    Comment: Performed at [facility], [address] [place], [place] [address]  Ethanol     Status: Abnormal   Collection Time: 12/14/18 12:55 PM  Result Value Ref Range   Alcohol, Ethyl (B) 267 (H) <10 mg/dL    Comment: (NOTE) Lowest detectable limit for serum alcohol is 10 mg/dL. For medical purposes only. Performed at [facility], [address] [place], [place] [address]   Salicylate level     Status: None   Collection Time: 12/14/18 12:55 PM  Result Value Ref Range   Salicylate Lvl <7.0 2.8 - 30.0 mg/dL    Comment: Performed at [facility],  [address] [place], [place] [address]  Acetaminophen level     Status: Abnormal   Collection Time: 12/14/18 12:55 PM  Result Value Ref Range   Acetaminophen (Tylenol), Serum <10 (L) 10 - 30 ug/mL    Comment: (NOTE) Therapeutic concentrations vary significantly. A range of  10-30 ug/mL  may be an effective concentration for many patients. However, some  are best treated at concentrations outside of this range. Acetaminophen concentrations >150 ug/mL at 4 hours after ingestion  and >50 ug/mL at 12 hours after ingestion are often associated with  toxic reactions. Performed at [facility], [address] [place], [place] [address]   cbc     Status: Abnormal   Collection Time: 12/14/18 12:55 PM  Result Value Ref Range   WBC 7.2 4.0 - 10.5 K/uL   RBC 6.12 (H) 4.22 - 5.81 MIL/uL   Hemoglobin 17.6 (H) 13.0 - 17.0 g/dL   HCT 56.4 (H) 33.2 - 95.1 %   MCV 86.8 80.0 - 100.0 fL   MCH 28.8 26.0 - 34.0 pg   MCHC 33.1 30.0 - 36.0 g/dL   RDW 88.4 16.6 - 06.3 %   Platelets 317 150 - 400 K/uL   nRBC 0.0 0.0 - 0.2 %    Comment: Performed at [facility], [address], [place], [place] [address]    Current Facility-Administered Medications  Medication Dose Route Frequency Provider Last Rate Last Dose  . folic acid (FOLVITE) tablet 1 mg  1 mg Oral Daily Kalib Bhagat T, MD      . lisinopril (PRINIVIL,ZESTRIL) tablet 10 mg  10 mg Oral Daily Walta Bellville T, MD      . LORazepam (ATIVAN) tablet 1 mg  1 mg Oral Q6H PRN Md Smola, Jackquline Denmark, MD       Or  . LORazepam (ATIVAN) injection 1 mg  1 mg Intravenous Q6H PRN Javis Abboud T, MD      . multivitamin with minerals tablet 1 tablet  1 tablet Oral Daily Saima Monterroso T, MD      . thiamine (VITAMIN B-1) tablet 100 mg  100 mg Oral Daily Jden Want T, MD       Or  . thiamine (B-1) injection 100 mg  100 mg Intravenous Daily Gagandeep Pettet, Jackquline Denmark, MD       Current Outpatient Medications  Medication Sig Dispense Refill  .  HYDROcodone-acetaminophen (NORCO) 5-325 MG tablet Take 1-2 tablets by mouth every 4 (four) hours as needed for moderate pain. 15 tablet 0  . ibuprofen (ADVIL,MOTRIN) 800 MG tablet Take 1 tablet (800 mg total) by mouth every 8 (eight) hours as needed. 30 tablet 0  . lisinopril (PRINIVIL,ZESTRIL) 10 MG tablet Take 1 tablet (10 mg total) by mouth daily. For hypertension 30 tablet 1  . naproxen (NAPROSYN) 500 MG tablet Take 1 tablet (500 mg total) by mouth 2 (two) times daily with a meal. 30 tablet 0  . traZODone (DESYREL) 50 MG tablet Take 1 tablet at bedtime if needed for sleep 30 tablet 0    Musculoskeletal: Strength & Muscle Tone: within normal limits Gait & Station: normal Patient leans: N/A  Psychiatric Specialty Exam: Physical Exam  Nursing note and vitals reviewed. Constitutional: He appears well-developed and well-nourished.  HENT:  Head: Normocephalic and atraumatic.  Eyes: Pupils are equal, round, and reactive to light. Conjunctivae are normal.  Neck: Normal range of motion.  Cardiovascular: Regular rhythm and normal heart sounds.  Respiratory: Effort normal. No respiratory distress.  GI: Soft.  Musculoskeletal: Normal range of motion.  Neurological: He is alert.  Skin: Skin is warm and dry.  Psychiatric: His mood appears anxious. His speech is tangential. He is agitated. He is not aggressive. Thought content is not paranoid. Cognition and memory are impaired. He expresses impulsivity. He  expresses suicidal ideation. He expresses no homicidal ideation. He expresses no suicidal plans. He exhibits abnormal recent memory.    Review of Systems  Constitutional: Negative.   HENT: Negative.   Eyes: Negative.   Respiratory: Negative.   Cardiovascular: Negative.   Gastrointestinal: Negative.   Musculoskeletal: Negative.   Skin: Negative.   Neurological: Negative.   Psychiatric/Behavioral: Positive for suicidal ideas. Negative for depression, hallucinations, memory loss and  substance abuse. The patient is not nervous/anxious and does not have insomnia.     Blood pressure (!) 187/121, pulse 89, temperature 98.1 F (36.7 C), temperature source Oral, resp. rate 18, height 5' (1.524 m), SpO2 97 %.Body mass index is 29.29 kg/m.  General Appearance: Disheveled  Eye Contact:  Minimal  Speech:  Slow and Slurred  Volume:  Decreased  Mood:  Euthymic  Affect:  Constricted  Thought Process:  Disorganized  Orientation:  Full (Time, Place, and Person)  Thought Content:  Illogical, Rumination and Tangential  Suicidal Thoughts:  Yes.  without intent/plan  Homicidal Thoughts:  No  Memory:  Immediate;   Fair Recent;   Fair Remote;   Fair  Judgement:  Impaired  Insight:  Shallow  Psychomotor Activity:  Decreased  Concentration:  Concentration: Fair  Recall:  [employer] of Knowledge:  Fair  Language:  Fair  Akathisia:  No  Handed:  Right  AIMS (if indicated):     Assets:  Housing Resilience  ADL's:  Impaired  Cognition:  Impaired,  Mild  Sleep:        Treatment Plan Summary: Daily contact with patient to assess and evaluate symptoms and progress in treatment, Medication management and Plan Patient under involuntary commitment was making suicidal threats earlier today.  Apparently doing it enough to really disturb people and get the attention of the police.  Although he is now minimizing it he has a history of doing this in the past.  Still probably somewhat intoxicated.  Patient is not safe for discharge and will be admitted to the psychiatric ward.  I have placed orders for an EKG and for detox protocol.  Full set of labs will be evaluated and he will be on 15-minute checks when he comes downstairs for further assessment.  Disposition: Recommend psychiatric Inpatient admission when medically cleared. Supportive therapy provided about ongoing stressors.  Mordecai Rasmussen, MD 12/14/2018 5:49 PM

## 2018-12-14 NOTE — ED Triage Notes (Addendum)
Pt to ED voluntary with SI. PT reported to police he had a plan to hit himself in the head with a hammer. Police were called after pt texted his neighbor and multiple other people stating he wanted to kill himself. Pt believes he was dx with schizophrenia but does not take medications. No HI, visual hallucinations or drug use reported but pt admits to alcohol use. PT also states form time to time he has auditory hallucinations but denies having them right now.

## 2018-12-14 NOTE — ED Notes (Signed)
Dr. Toni Amendlapacs talking with patient , Patient remains calm and cooperative.

## 2018-12-14 NOTE — BH Assessment (Signed)
Patient is to be admitted to [facility] by Dr. Toni Amend.  Attending Physician will be Dr. Toni Amend.   Patient has been assigned to room 311, by [facility] Charge Nurse Shatara.   Intake Paper Work has been signed and placed on patient chart.  ER staff is aware of the admission:  LouAnn, ER Secretary    Dr. Fanny Bien, ER MD   Prudencio Burly, Patient's Nurse   Dedra Skeens, Patient Access.

## 2018-12-15 ENCOUNTER — Encounter: Payer: Self-pay | Admitting: Psychiatry

## 2018-12-15 ENCOUNTER — Other Ambulatory Visit: Payer: Self-pay

## 2018-12-15 LAB — HEMOGLOBIN A1C
Hgb A1c MFr Bld: 5.2 % (ref 4.8–5.6)
Mean Plasma Glucose: 102.54 mg/dL

## 2018-12-15 LAB — LIPID PANEL
Cholesterol: 262 mg/dL — ABNORMAL HIGH (ref 0–200)
HDL: 75 mg/dL (ref >40)
LDL Cholesterol: 171 mg/dL — ABNORMAL HIGH (ref 0–99)
Total CHOL/HDL Ratio: 3.5 RATIO
Triglycerides: 79 mg/dL (ref <150)
VLDL: 16 mg/dL (ref 0–40)

## 2018-12-15 LAB — TSH: TSH: 3.121 u[IU]/mL (ref 0.350–4.500)

## 2018-12-15 MED ORDER — INFLUENZA VAC SPLIT QUAD 0.5 ML IM SUSY
0.5000 mL | PREFILLED_SYRINGE | INTRAMUSCULAR | Status: AC
  Administered 2018-12-16: 0.5 mL via INTRAMUSCULAR
  Filled 2018-12-15: qty 0.5

## 2018-12-15 MED ORDER — CLONIDINE HCL 0.1 MG PO TABS
0.1000 mg | ORAL_TABLET | Freq: Once | ORAL | Status: AC
  Administered 2018-12-15: 0.1 mg via ORAL
  Filled 2018-12-15: qty 1

## 2018-12-15 MED ORDER — AMLODIPINE BESYLATE 5 MG PO TABS
5.0000 mg | ORAL_TABLET | Freq: Every day | ORAL | Status: DC
  Administered 2018-12-15 – 2018-12-17 (×3): 5 mg via ORAL
  Filled 2018-12-15 (×2): qty 1

## 2018-12-15 NOTE — Tx Team (Signed)
Initial Treatment Plan 12/15/2018 12:14 AM Daiva Huge [id number]    PATIENT STRESSORS: Unemployment Financial difficulties   PATIENT STRENGTHS: Ability for insight Active sense of humor Communication skills Motivation for treatment/growth Physical Health   PATIENT IDENTIFIED PROBLEMS: Alcohol abuse  Depression                   DISCHARGE CRITERIA:  Ability to meet basic life and health needs Improved stabilization in mood, thinking, and/or behavior Motivation to continue treatment in a less acute level of care  PRELIMINARY DISCHARGE PLAN: Attend PHP/IOP Attend 12-step recovery group Outpatient therapy  PATIENT/FAMILY INVOLVEMENT: This treatment plan has been presented to and reviewed with the patient, Fred [name]. The patient has  been given the opportunity to ask questions and make suggestions.  Olin Pia, RN 12/15/2018, 12:14 AM

## 2018-12-15 NOTE — Plan of Care (Signed)
Newly admitted. Cooperative and adjusting well. No thoughts of self harm
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
## 2018-12-15 NOTE — BHH Counselor (Signed)
Adult Comprehensive Assessment  Patient ID: Fred [name], male   DOB: 22-Mar-1976, 43 y.o.   MRN: [id number]  Information Source:    Current Stressors:  Patient states their primary concerns and needs for treatment are:: He became depressed and was misunderstood by his land lady Patient states their goals for this hospitilization and ongoing recovery are:: He will see doctor- and go to [facility] and get a PCP Educational / Learning stressors: na Employment / Job issues: na Family Relationships: Good- Close to WES[employer] InternationalMom and Freescale SemiconductorCousin Financial / Lack of resources (include bankruptcy): na Housing / Lack of housing: na Physical health (include injuries & life threatening diseases): na Social relationships: na Substance abuse: uses alcohol and marajuana  Bereavement / Loss: na  Living/Environment/Situation:  Living Arrangements: Alone Living conditions (as described by patient or guardian): good I live in an apartment Who else lives in the home?: myself How long has patient lived in current situation?: 1 year What is atmosphere in current home: Supportive  Family History:  Marital status: Single Are you sexually active?: No What is your sexual orientation?: heterosexual Has your sexual activity been affected by drugs, alcohol, medication, or emotional stress?: na Does patient have children?: Yes(I child lives with its mom) How many children?: 1 How is patient's relationship with their children?: OK he is 43 years old  Childhood History:  By whom was/is the patient raised?: Mother Additional childhood history information: Good childhood Description of patient's relationship with caregiver when they were a child: good Patient's description of current relationship with people who raised him/her: good How were you disciplined when you got in trouble as a child/adolescent?: reasonable Does patient have siblings?: No Did patient suffer any verbal/emotional/physical/sexual abuse as a child?: No Did  patient suffer from severe childhood neglect?: No Has patient ever been sexually abused/assaulted/raped as an adolescent or adult?: No Was the patient ever a victim of a crime or a disaster?: No Witnessed domestic violence?: No Has patient been effected by domestic violence as an adult?: No  Education:  Highest grade of school patient has completed: Grade 11 Currently a student?: No Learning disability?: No  Employment/Work Situation:   Employment situation: Employed Patient's job has been impacted by current illness: No What is the longest time patient has a held a job?: never worked Did Secretary/administratorYou Receive Any Psychiatric Treatment/Services While in Equities traderthe Military?: No Are There Guns or Education officer, communityther Weapons in Your Home?: No Are These ComptrollerWeapons Safely Secured?: (na)  Financial Resources:   Financial resources: Insurance claims handlereceives SSDI Does patient have a Lawyerrepresentative payee or guardian?: Yes(Anthony Psychologist, prison and probation servicesMiller Payee ( cousin))  Alcohol/Substance Abuse:   What has been your use of drugs/alcohol within the last 12 months?: 3 beers a day and smokes marjuana ( blunt about 1x week) If attempted suicide, did drugs/alcohol play a role in this?: No Alcohol/Substance Abuse Treatment Hx: Denies past history Has alcohol/substance abuse ever caused legal problems?: No  Social Support System:   Conservation officer, natureatient's Community Support System: Fair Describe Community Support System: My Cousin Ethelene Brownsnthony is my payee- Malachi CarlLandlady Jan is a friend and he has a neighbour Type of faith/religion: [employer] How does patient's faith help to cope with current illness?: yes  Leisure/Recreation:   Leisure and Hobbies: Helping others, walking, TV   Strengths/Needs:   What is the patient's perception of their strengths?: I listen and talk to people, im a good listener, I help people clean up and move furniture Patient states they can use these personal strengths during their treatment to  contribute to their recovery: good listener Patient states  these barriers may affect/interfere with their treatment: drinking too much ( as per his cousin) Patient states these barriers may affect their return to the community: na Other important information patient would like considered in planning for their treatment: He would like a PCP and he would be interested in [facility]  Discharge Plan:   Currently receiving community mental health services: No Patient states concerns and preferences for aftercare planning are: [facility] and Therapy Patient states they will know when they are safe and ready for discharge when: When doctor says so Does patient have access to transportation?: Yes Does patient have financial barriers related to discharge medications?: No Patient description of barriers related to discharge medications: none Will patient be returning to same living situation after discharge?: Yes  Summary/Recommendations:  Patient is male 43 years of age with a mild Dev Disability ( 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 PLEASE NOTIFY HIM WHEN PATIENT [phone]. Patient is not suicidal, NO AH/VH and has never been to a psychiatrist or out patient clinic. He does report he gets depressed at times ( when he drinks alcohol) He is on SSI and has [employer]- He is agreeable to attend group and take medications and will go to [facility] for some therapy and SA information. He does report drinking 3 beers a day and smokes weed, he does not report  its a problem if he does not drink or smokes.   Marian Meneely M. 12/15/2018

## 2018-12-15 NOTE — Plan of Care (Signed)
  Problem: Education: Goal: Knowledge of [facility] Education information/materials will improve Outcome: Progressing Goal: Emotional status will improve Outcome: Progressing Goal: Mental status will improve Outcome: Progressing Goal: Verbalization of understanding the information provided will improve Outcome: Progressing   Problem: Education: Goal: Ability to state activities that reduce stress will improve Outcome: Progressing   Problem: Self-Concept: Goal: Level of anxiety will decrease Outcome: Progressing Goal: Ability to modify response to factors that promote anxiety will improve Outcome: Progressing   Problem: Education: Goal: Knowledge of disease or condition will improve Outcome: Progressing   Problem: Health Behavior/Discharge Planning: Goal: Identification of resources available to assist in meeting health care needs will improve Outcome: Progressing   Problem: Physical Regulation: Goal: Complications related to the disease process, condition or treatment will be avoided or minimized Outcome: Progressing

## 2018-12-15 NOTE — BHH Group Notes (Signed)
  LCSW Group Therapy Note  12/15/2018 1:15pm  Type of Therapy/Topic:  Group Therapy:  Feelings about Diagnosis  Participation Level:  Good paricipation Description of Group:   This group will allow patients to explore their thoughts and feelings about diagnoses they have received. Patients will be guided to explore their level of understanding and acceptance of these diagnoses. Facilitator will encourage patients to process their thoughts and feelings about the reactions of others to their diagnosis and will guide patients in identifying ways to discuss their diagnosis with significant others in their lives. This group will be process-oriented, with patients participating in exploration of their own experiences, giving and receiving support, and processing challenge from other group members.   Therapeutic Goals: 1. Patient will demonstrate understanding of diagnosis as evidenced by identifying two or more symptoms of the disorder 2. Patient will be able to express two feelings regarding the diagnosis 3. Patient will demonstrate their ability to communicate their needs through discussion and/or role play  Summary of Patient Progress:  This patient was supportive of peers and reported he goes for walk and stays in shape and this helps him cope when he is depressed.   Therapeutic Modalities:   Cognitive Behavioral Therapy Brief Therapy Feelings Identification    Cheron Schaumann, LCSW 12/15/2018 2:06 PM

## 2018-12-15 NOTE — [facility] Suicide Risk Assessment (Signed)
[facility]   Nursing information obtained from:  Patient Demographic factors:  Male, Low socioeconomic status Current Mental Status:  NA Loss Factors:  NA Historical Factors:  NA Risk Reduction Factors:  NA  Total Time spent with patient: 1 hour Principal Problem: Suicidal behavior Diagnosis:  Active Problems:   Adjustment disorder with mixed disturbance of emotions and conduct  Alcohol withdrawal, Alcohol use disorder, severe Hypertension History of Scoliosis   Subjective Data: Suicidal ideation, Alcohol use disorder  Continued Clinical Symptoms:  Alcohol Use Disorder Identification Test Final Score (AUDIT): 5 The "Alcohol Use Disorders Identification Test", Guidelines for Use in Primary Care, Second Edition.  World Science writerHealth Organization [facility](WHO). Score between 0-7:  no or low risk or alcohol related problems. Score between 8-15:  moderate risk of alcohol related problems. Score between 16-19:  high risk of alcohol related problems. Score 20 or above:  warrants further diagnostic evaluation for alcohol dependence and treatment.  CLINICAL FACTORS:   Alcohol/Substance Abuse/Dependencies Unstable or Poor Therapeutic Relationship Previous Psychiatric Diagnoses and Treatments  Musculoskeletal: Strength & Muscle Tone: within normal limits Gait & Station: normal Patient leans: N/A  Psychiatric Specialty Exam: Physical Exam  Constitutional: He is oriented to person, place, and time. He appears well-developed and well-nourished.  HENT:  Head: Normocephalic and atraumatic.  Eyes: EOM are normal.  Cardiovascular: Intact distal pulses.  Respiratory: Effort normal.  Musculoskeletal: Normal range of motion.  Neurological: He is alert and oriented to person, place, and time.  Skin: Skin is warm and dry.    Review of Systems  Constitutional: Negative.   Eyes: Negative.   Cardiovascular: Negative for chest pain and palpitations.  Gastrointestinal: Negative  for abdominal pain, nausea and vomiting.  Musculoskeletal: Negative.   Skin: Negative.   Neurological: Negative for dizziness, tingling, seizures, loss of consciousness, weakness and headaches.    Blood pressure (!) 122/98, pulse 78, temperature 98.3 F (36.8 C), temperature source Oral, resp. rate 20, height 5' (1.524 m), weight 67.1 kg, SpO2 100 %.Body mass index is 28.9 kg/m.  General Appearance: Disheveled  Eye Contact:  Fair  Speech:  Slow  Volume:  Normal  Mood:  "fine"  Affect:  Evasive, somewhat defiant  Thought Process:  Goal Directed and Linear  Orientation:  Full (Time, Place, and Person)  Thought Content:  Logical  Suicidal Thoughts:  no  Homicidal Thoughts:  No  Memory:  Immediate;   Fair Recent;   Fair  Judgement:  Poor  Insight:  Lacking  Psychomotor Activity:  Normal  Concentration:  Concentration: Fair and Attention Span: Fair  Recall:  [employer]Fair  Fund of Knowledge:  Fair  Language:  Fair  Akathisia:  No  Handed:  Right  AIMS (if indicated):     Assets:  Communication Skills Housing  ADL's:  Intact  Cognition:  WNL  Sleep:  Number of Hours: 5.15    COGNITIVE FEATURES THAT CONTRIBUTE TO RISK:  Closed-mindedness and Polarized thinking    SUICIDE RISK:   Moderate:  Frequent suicidal ideation with limited intensity, and duration, some specificity in terms of plans, no associated intent, good self-control, limited dysphoria/symptomatology, some risk factors present, and identifiable protective factors, including available and accessible social support.  PLAN OF CARE:   Daily contact with patient to assess and evaluate symptoms and progress in treatment, Medication management and PlanPatient under involuntary commitment. The patient stated that he is open to discussing medications to assist with his mood and potentially medications to help with his alcohol  use, but is unwilling to start anything today after being committed to the hospital. Considering his alcohol  use, he is at a high risk for withdrawal symptoms and additional medications may lower his seizure threshold. Will monitor for now with CIWA-AR and linked ativan. He was unwilling to participate in creating a safe discharge plan, and considering his vital signs, alcohol withdrawal and recent suicidality with plan he is unsafe to be discharged at this time  Please see his H/P for full Plan and medications  I certify that inpatient services furnished can reasonably be expected to improve the patient's condition.   Luciano CutterJustin R Guyla Bless, DO 12/15/2018, 5:43 PM

## 2018-12-15 NOTE — [facility] Suicide Risk Assessment (Signed)
[facility] INPATIENT:  Family/Significant Other Suicide Prevention Education  Suicide Prevention Education:  Education Completed; Ariana Richwine [phone] has been identified by the patient as the family member/significant other with whom the patient will be residing, and identified as the person(s) who will aid the patient in the event of a mental health crisis (suicidal ideations/suicide attempt).  With written consent from the patient, the family member/significant other has been provided the following suicide prevention education, prior to the and/or following the discharge of the patient.  The suicide prevention education provided includes the following:  Suicide risk factors  Suicide prevention and interventions  National Suicide Hotline telephone number  [facility] assessment telephone number  [facility] Emergency Assistance 911  [facility] and/or Residential Mobile Crisis Unit telephone number  Request made of family/significant other to:  Remove weapons (e.g., guns, rifles, knives), all items previously/currently identified as safety concern.    Remove drugs/medications (over-the-counter, prescriptions, illicit drugs), all items previously/currently identified as a safety concern.  The family member/significant other verbalizes understanding of the suicide prevention education information provided.  The family member/significant other agrees to remove the items of safety concern listed above.  Arrie Senate M 12/15/2018, 11:28 AM

## 2018-12-15 NOTE — Progress Notes (Signed)
Patient ID: Fred [name], male   DOB: [birth year], 43 y.o.   MRN: [id number] Admitted involuntarily. Patient presented to ED with chief complaint of alcohol use. Reported that he has been drinking heavily, unable to control his drinking habit. Upon assessment, patient expresses remorse and disappointment in self. "I made my mama scared, I can't function, I have been thinking wrong". Patient does not remember what he did while he was drunk. He is motivated for treatment. Patient is a cigarette smoker (3-4 cigarettes a day) and not interested in smoking cessation. Currently denying thoughts of self harm. Assessment completed: patient admitted and oriented to the unit. Safety precautions initiated. To be evaluated by attending provider.

## 2018-12-15 NOTE — Progress Notes (Addendum)
Patient pleasant and cooperative with care. Medication compliant. Appropriate with staff and peers. Denies SI, HI, AVH. Reports yesterday he was suicidal but no longer is. Visible in milieu. Eating meals. Encouragement and support offered. Safety checks maintained. Patient receptive and remains safe on unit with q 15 min checks.  Patient b/p elevated. Md notified. Medications ordered. Will continue to monitor and assess.

## 2018-12-16 MED ORDER — NALTREXONE HCL 50 MG PO TABS
50.0000 mg | ORAL_TABLET | Freq: Every day | ORAL | Status: DC
  Administered 2018-12-16 – 2018-12-17 (×2): 50 mg via ORAL
  Filled 2018-12-16 (×2): qty 1

## 2018-12-16 NOTE — Plan of Care (Signed)
Patient is alert and oriented , stable and doing good in the unit , patient has adequate coping skills and denies  SI/HI/AVH, contract for safety, his mood is positive  and affect  is good , patient is pleasant and appropriate, has insights into circumstances leading to hospitalization, encourage positive feeling of self , 15 minute safety rounding is maintained no distress.   Problem: Education: Goal: Knowledge of [facility] Education information/materials will improve Outcome: Progressing Goal: Emotional status will improve Outcome: Progressing Goal: Mental status will improve Outcome: Progressing Goal: Verbalization of understanding the information provided will improve Outcome: Progressing   Problem: Education: Goal: Ability to state activities that reduce stress will improve Outcome: Progressing   Problem: Self-Concept: Goal: Level of anxiety will decrease Outcome: Progressing Goal: Ability to modify response to factors that promote anxiety will improve Outcome: Progressing   Problem: Education: Goal: Knowledge of disease or condition will improve Outcome: Progressing Goal: Understanding of discharge needs will improve Outcome: Progressing   Problem: Health Behavior/Discharge Planning: Goal: Ability to identify changes in lifestyle to reduce recurrence of condition will improve Outcome: Progressing Goal: Identification of resources available to assist in meeting health care needs will improve Outcome: Progressing   Problem: Physical Regulation: Goal: Complications related to the disease process, condition or treatment will be avoided or minimized Outcome: Progressing

## 2018-12-16 NOTE — [facility] Group Notes (Signed)
2CSW Group Therapy Note   12/16/2018 1:15pm   Type of Therapy and Topic:  Group Therapy:  Positive Affirmations   Participation Level:  Did Not Attend  Description of Group: This group addressed positive affirmation toward self and others. Patients went around the room and identified two positive things about themselves and two positive things about a peer in the room. Patients reflected on how it felt to share something positive with others, to identify positive things about themselves, and to hear positive things from others. Patients were encouraged to have a daily reflection of positive characteristics or circumstances.  Therapeutic Goals 1. Patient will verbalize two of their positive qualities 2. Patient will demonstrate empathy for others by stating two positive qualities about a peer in the group 3. Patient will verbalize their feelings when voicing positive self affirmations and when voicing positive affirmations of others 4. Patients will discuss the potential positive impact on their wellness/recovery of focusing on positive traits of self and others. Summary of Patient Progress:    Therapeutic Modalities Cognitive Behavioral Therapy Motivational Interviewing  Ida Rogue, [place] 12/16/2018 1:09 PM

## 2018-12-16 NOTE — Plan of Care (Signed)
D: Patient is pleasant with staff.  He states, "I'm really ok.  I feel ready to go."  Encouraged patient to inform the MD.  He is compliant with his medications and he is cooperative.  Patient denies any thoughts of self harm today.  He remains isolative to his room, electing to come out during meal times.  He is pleasant and smiles often.  He denies any HI/AVH.  A: Continue to monitor medication management and MD orders.  Safety checks completed every 15 minutes per protocol.  Offer support and encouragement as needed.  R: Patient is receptive to staff; his/her behavior is appropriate.    Problem: Education: Goal: Emotional status will improve Outcome: Progressing Goal: Mental status will improve Outcome: Progressing Goal: Verbalization of understanding the information provided will improve Outcome: Progressing   Problem: Self-Concept: Goal: Level of anxiety will decrease Outcome: Progressing

## 2018-12-16 NOTE — Progress Notes (Signed)
[facility]  12/16/2018 2:38 PM Wynetta EmeryRoderick Lakeith [place]  MRN:  [id number] Subjective:   Patient slept 7 hours overnight did not score on his CIWA, did not require as needed medications for agitation, has been compliant with staff but withdrawn to his room.  Vital signs have improved on his new blood pressure medications.  The patient was interviewed in his room this morning.  He was found sleeping in his bed but awoke and participated with his interview.  He reported he was frustrated that he is still here in the hospital, but appeared to respond well to education and encouragement to engage in his treatment.  He continues to report that he has a goal of decreasing the amount of alcohol that he consumes and was willing to start naltrexone today.  We discussed some of the benefits and potential side effects, which included headaches, nausea, difficulty controlling pain in situations where opioids are prescribed by other physicians.  He stated that he would like to speak to the social worker today's to work on a safe discharge plan.  Otherwise he denies current concerns.  Principal Diagnosis:Suicidal ideation Diagnosis:  Principal Problem: Suicidal ideation Active Problems: Adjustment disorder with mixed anxiety and depressed mood Alcohol use Alcohol withdrawal Alcohol use disorder, severe Hypertension History of scoliosis  Total Time spent with patient: 20 minutes  Past Psychiatric History: He reports 1 previous hospitalization for a suicide attempt in 2015 which had a similar situation.  He denies any outpatient psychiatric involvement.  Denies any medication usage.   Past Medical History:  Past Medical History:  Diagnosis Date  . Hypertension   . Medical history non-contributory   . Scoliosis     Past Surgical History:  Procedure Laterality Date  . NO PAST SURGERIES     Family History: History reviewed. No pertinent family history. Family Psychiatric  History:  Denies Social History:  Social History   Substance and Sexual Activity  Alcohol Use Yes  . Alcohol/week: 1.0 standard drinks  . Types: 1 Cans of beer per week     Social History   Substance and Sexual Activity  Drug Use Yes  . Frequency: 1.0 times per week  . Types: Marijuana    Social History   Socioeconomic History  . Marital status: Single    Spouse name: Not on file  . Number of children: Not on file  . Years of education: Not on file  . Highest education level: Not on file  Occupational History  . Not on file  Social Needs  . Financial resource strain: Not on file  . Food insecurity:    Worry: Not on file    Inability: Not on file  . Transportation needs:    Medical: Not on file    Non-medical: Not on file  Tobacco Use  . Smoking status: Current Every Day Smoker    Years: 20.00    Types: Cigarettes  . Smokeless tobacco: Never Used  . Tobacco comment: pt refused  Substance and Sexual Activity  . Alcohol use: Yes    Alcohol/week: 1.0 standard drinks    Types: 1 Cans of beer per week  . Drug use: Yes    Frequency: 1.0 times per week    Types: Marijuana  . Sexual activity: Yes  Lifestyle  . Physical activity:    Days per week: Not on file    Minutes per session: Not on file  . Stress: Not on file  Relationships  . Social connections:  Talks on phone: Not on file    Gets together: Not on file    Attends religious service: Not on file    Active member of club or organization: Not on file    Attends meetings of clubs or organizations: Not on file    Relationship status: Not on file  Other Topics Concern  . Not on file  Social History Narrative  . Not on file   Additional Social History:                         Sleep: Fair  Appetite:  Fair  Current Medications: Current Facility-Administered Medications  Medication Dose Route Frequency Provider Last Rate Last Dose  . acetaminophen (TYLENOL) tablet 650 mg  650 mg Oral Q6H PRN Clapacs,  John T, MD      . alum & mag hydroxide-simeth (MAALOX/MYLANTA) 200-200-20 MG/5ML suspension 30 mL  30 mL Oral Q4H PRN Clapacs, John T, MD      . amLODipine (NORVASC) tablet 5 mg  5 mg Oral Daily Donata Clay R, DO   5 mg at 12/16/18 0755  . folic acid (FOLVITE) tablet 1 mg  1 mg Oral Daily Clapacs, Jackquline Denmark, MD   1 mg at 12/16/18 0754  . hydrOXYzine (ATARAX/VISTARIL) tablet 25 mg  25 mg Oral TID PRN Clapacs, John T, MD      . lisinopril (PRINIVIL,ZESTRIL) tablet 10 mg  10 mg Oral Daily Clapacs, Jackquline Denmark, MD   10 mg at 12/16/18 0756  . LORazepam (ATIVAN) tablet 1 mg  1 mg Oral Q6H PRN Clapacs, Jackquline Denmark, MD       Or  . LORazepam (ATIVAN) injection 1 mg  1 mg Intravenous Q6H PRN Clapacs, John T, MD      . magnesium hydroxide (MILK OF MAGNESIA) suspension 30 mL  30 mL Oral Daily PRN Clapacs, John T, MD      . multivitamin with minerals tablet 1 tablet  1 tablet Oral Daily Clapacs, Jackquline Denmark, MD   1 tablet at 12/16/18 0755  . thiamine (VITAMIN B-1) tablet 100 mg  100 mg Oral Daily Clapacs, John T, MD   100 mg at 12/16/18 4332   Or  . thiamine (B-1) injection 100 mg  100 mg Intravenous Daily Clapacs, John T, MD      . traZODone (DESYREL) tablet 100 mg  100 mg Oral QHS PRN Clapacs, Jackquline Denmark, MD   100 mg at 12/15/18 2136    Lab Results: No results found for this or any previous visit (from the past 48 hour(s)).  Blood Alcohol level:  Lab Results  Component Value Date   ETH 267 (H) 12/14/2018   ETH 190 (H) 08/18/2016    Metabolic Disorder Labs: Lab Results  Component Value Date   HGBA1C 5.2 12/14/2018   MPG 102.54 12/14/2018   No results found for: PROLACTIN Lab Results  Component Value Date   CHOL 262 (H) 12/14/2018   TRIG 79 12/14/2018   HDL 75 12/14/2018   CHOLHDL 3.5 12/14/2018   VLDL 16 12/14/2018   LDLCALC 171 (H) 12/14/2018    Physical Findings: AIMS:  , ,  ,  ,    CIWA:  CIWA-Ar Total: 0 COWS:     Musculoskeletal: Strength & Muscle Tone: within normal limits Gait & Station:  normal Patient leans: N/A  Psychiatric Specialty Exam: Physical Exam  Constitutional: He is oriented to person, place, and time. He appears well-developed and well-nourished.  HENT:  Head: Normocephalic and atraumatic.  Eyes: Right eye exhibits abnormal extraocular motion.  Respiratory: Effort normal.  Musculoskeletal: Normal range of motion.  Neurological: He is alert and oriented to person, place, and time.    Review of Systems  Constitutional: Negative for malaise/fatigue.  Eyes: Negative for blurred vision and double vision.  Respiratory: Negative for cough and shortness of breath.   Cardiovascular: Negative for chest pain and palpitations.  Gastrointestinal: Negative for abdominal pain, heartburn and nausea.  Musculoskeletal: Negative for myalgias.  Neurological: Negative for dizziness and headaches.    Blood pressure 120/84, pulse 69, temperature 97.8 F (36.6 C), temperature source Oral, resp. rate 18, height 5' (1.524 m), weight 67.1 kg, SpO2 99 %.Body mass index is 28.9 kg/m.  General Appearance: Casual and Disheveled  Eye Contact:  Fair  Speech:  Slow  Volume:  Normal  Mood:  "fine"  Affect:  initially irritable, reluctant  Thought Process:  Perseverative on discharge  Orientation:  Full (Time, Place, and Person)  Thought Content:  Logical  Suicidal Thoughts:  No  Homicidal Thoughts:  No  Memory:  Immediate;   Fair  Judgement:  Poor  Insight:  Lacking  Psychomotor Activity:  Normal  Concentration:  Concentration: Fair  Recall:  [employer]Fair  Fund of Knowledge:  Fair  Language:  Fair  Akathisia:  No  Handed:  Right  AIMS (if indicated):     Assets:  Housing Leisure Time  ADL's:  Intact  Cognition:  WNL  Sleep:  Number of Hours: 7     Treatment Plan Summary: Daily contact with patient to assess and evaluate symptoms and progress in treatment and Medication management   Daily contact with patient to assess and evaluate symptoms and progress in treatment,  Medication management and PlanPatient under involuntary commitment. The patient stated that he is open to discussing medications to assist with his mood and potentially medications to help with his alcohol use, but is unwilling to start anything today after being committed to the hospital. Considering his alcohol use, he is at a high risk for withdrawal symptoms and additional medications may lower his seizure threshold. Will monitor for now with CIWA-AR and linked ativan. He was unwilling to participate in creating a safe discharge plan, and considering his vital signs, alcohol withdrawal and recent suicidality with plan he is unsafe to be discharged at this time.  General: Continue admission to the locked inpatient unit Will continue on safety checks and CIWA Regular Diet Full code  Alcohol withdrawal CIWA-AR with Ativan  Alcohol Use disorder Start Naltrexone 50mg  PO Q Daily  Hypertension Lisinopril 10mg  PO Q Daily Norvasc 5mg  PO Q Daily Should follow up with outpatient primary care after discharge   PRN vistaril 25mg  PO TID for anxiety  PRN comfort medications   Disposition:Lives alone, may discharge back to home with appropriate follow up once stabilized.   Luciano CutterJustin R Nahiara Kretzschmar, DO 12/16/2018, 2:38 PM

## 2018-12-17 DIAGNOSIS — F4325 Adjustment disorder with mixed disturbance of emotions and conduct: Principal | ICD-10-CM

## 2018-12-17 MED ORDER — THIAMINE HCL 100 MG PO TABS: 100.0000 mg | ORAL_TABLET | Freq: Every day | ORAL | 1 refills | Status: DC

## 2018-12-17 MED ORDER — NALTREXONE HCL 50 MG PO TABS: 50.0000 mg | ORAL_TABLET | Freq: Every day | ORAL | 1 refills | Status: DC

## 2018-12-17 MED ORDER — LISINOPRIL 10 MG PO TABS: 10.0000 mg | ORAL_TABLET | Freq: Every day | ORAL | 1 refills | Status: DC

## 2018-12-17 MED ORDER — AMLODIPINE BESYLATE 5 MG PO TABS: 5.0000 mg | ORAL_TABLET | Freq: Every day | ORAL | 1 refills | Status: DC

## 2018-12-17 NOTE — Plan of Care (Signed)
Patient verbalizes understanding of the general information that's been provided to him and all questions/concerns have been addressed and answered at this time. Patient denies SI/HI/AVH as well as any signs/symptoms of depression/anxiety stating to this writer that overall he is feeling "excellent". Patient verbalizes understanding of his discharge needs and has the ability to identify the available resources that can assist him in meeting his healthcare needs. Patient has been free from any complications thus far and remains safe on the unit at this time.  Problem: Education: Goal: Knowledge of [facility] Education information/materials will improve Outcome: Progressing Goal: Emotional status will improve Outcome: Progressing Goal: Mental status will improve Outcome: Progressing Goal: Verbalization of understanding the information provided will improve Outcome: Progressing   Problem: Education: Goal: Ability to state activities that reduce stress will improve Outcome: Progressing   Problem: Self-Concept: Goal: Level of anxiety will decrease Outcome: Progressing Goal: Ability to modify response to factors that promote anxiety will improve Outcome: Progressing   Problem: Education: Goal: Knowledge of disease or condition will improve Outcome: Progressing Goal: Understanding of discharge needs will improve Outcome: Progressing   Problem: Health Behavior/Discharge Planning: Goal: Ability to identify changes in lifestyle to reduce recurrence of condition will improve Outcome: Progressing Goal: Identification of resources available to assist in meeting health care needs will improve Outcome: Progressing   Problem: Physical Regulation: Goal: Complications related to the disease process, condition or treatment will be avoided or minimized Outcome: Progressing

## 2018-12-17 NOTE — [facility] Group Notes (Signed)
[facility] LCSW Group Therapy Note  Date/Time: 12/17/18, 1300  Type of Therapy and Topic:  Group Therapy:  Overcoming Obstacles  Participation Level:  Did not attend  Description of Group:    In this group patients will be encouraged to explore what they see as obstacles to their own wellness and recovery. They will be guided to discuss their thoughts, feelings, and behaviors related to these obstacles. The group will process together ways to cope with barriers, with attention given to specific choices patients can make. Each patient will be challenged to identify changes they are motivated to make in order to overcome their obstacles. This group will be process-oriented, with patients participating in exploration of their own experiences as well as giving and receiving support and challenge from other group members.  Therapeutic Goals: 1. Patient will identify personal and current obstacles as they relate to admission. 2. Patient will identify barriers that currently interfere with their wellness or overcoming obstacles.  3. Patient will identify feelings, thought process and behaviors related to these barriers. 4. Patient will identify two changes they are willing to make to overcome these obstacles:    Summary of Patient Progress      Therapeutic Modalities:   Cognitive Behavioral Therapy Solution Focused Therapy Motivational Interviewing Relapse Prevention Therapy  Daleen Squibb, LCSW

## 2018-12-17 NOTE — Progress Notes (Signed)
Recreation Therapy Notes  Date: 12/17/2017  Time: 9:30 am   Location: Craft room   Behavioral response: N/A   Intervention Topic: Anger Management  Discussion/Intervention: Patient did not attend group.   Clinical Observations/Feedback:  Patient did not attend group.   Nikisha Fleece LRT/CTRS          Gerrett Loman 12/17/2018 11:31 AM 

## 2018-12-17 NOTE — Discharge Summary (Signed)
Physician Discharge Summary Note  Patient:  Fred EmeryRoderick Lakeith Broerman is an 43 y.o., male MRN:  [id number] DOB:  Feb 25, 1976 Patient phone:  [phone] (home)  Patient address:   [address] [place] [id number],  Total Time spent with patient: 45 minutes  Date of Admission:  12/14/2018 Date of Discharge: December 17, 2018  Reason for Admission: Admitted through the emergency room where he presented with a history of texting out suicidal messages to neighbors and friends.  Patient was intoxicated on presentation.  Has a history of expressing suicidal thoughts.  Patient was detoxed without incident.  Had no seizures no sign of delirium.  Mood has improved once he is sober.  He has consistently denied any suicidal ideation.  Not engaged in any dangerous behavior.  He was seen over the weekend by the doctor who is rounding and was started on naltrexone as part of a program to help him stay sober.  Principal Problem: Adjustment disorder with mixed disturbance of emotions and conduct Discharge Diagnoses: Principal Problem:   Adjustment disorder with mixed disturbance of emotions and conduct Active Problems:   Suicidal ideation   Alcohol abuse   Hypertension   Past Psychiatric History: Patient has a history of evaluation for alcohol abuse but has not followed up with outpatient treatment  Past Medical History:  Past Medical History:  Diagnosis Date  . Hypertension   . Medical history non-contributory   . Scoliosis     Past Surgical History:  Procedure Laterality Date  . NO PAST SURGERIES     Family History: History reviewed. No pertinent family history. Family Psychiatric  History: None Social History:  Social History   Substance and Sexual Activity  Alcohol Use Yes  . Alcohol/week: 1.0 standard drinks  . Types: 1 Cans of beer per week     Social History   Substance and Sexual Activity  Drug Use Yes  . Frequency: 1.0 times per week  . Types: Marijuana    Social History    Socioeconomic History  . Marital status: Single    Spouse name: Not on file  . Number of children: Not on file  . Years of education: Not on file  . Highest education level: Not on file  Occupational History  . Not on file  Social Needs  . Financial resource strain: Not on file  . Food insecurity:    Worry: Not on file    Inability: Not on file  . Transportation needs:    Medical: Not on file    Non-medical: Not on file  Tobacco Use  . Smoking status: Current Every Day Smoker    Years: 20.00    Types: Cigarettes  . Smokeless tobacco: Never Used  . Tobacco comment: pt refused  Substance and Sexual Activity  . Alcohol use: Yes    Alcohol/week: 1.0 standard drinks    Types: 1 Cans of beer per week  . Drug use: Yes    Frequency: 1.0 times per week    Types: Marijuana  . Sexual activity: Yes  Lifestyle  . Physical activity:    Days per week: Not on file    Minutes per session: Not on file  . Stress: Not on file  Relationships  . Social connections:    Talks on phone: Not on file    Gets together: Not on file    Attends religious service: Not on file    Active member of club or organization: Not on file    Attends  meetings of clubs or organizations: Not on file    Relationship status: Not on file  Other Topics Concern  . Not on file  Social History Narrative  . Not on file    Hospital Course: See some of the note above.  He was admitted to the hospital because of suicidal statements.  15-minute checks employed.  Patient did not show any dangerous behavior in the hospital.  No seizures no delirium.  Cooperated with treatment appropriately.  Was started on naltrexone by the psychiatrist on-call over the weekend and has tolerated that.  Blood pressure was treated with starting lisinopril and amlodipine which have been tolerated.  Patient spoke with the representative from [facility] and is agreeable to outpatient treatment for substance abuse.  On evaluation today he completely  denies suicidal ideation and appears to be upbeat and appropriate in his affect and thinking and not to represent an increased risk of self-harm.  Physical Findings: AIMS:  , ,  ,  ,    CIWA:  CIWA-Ar Total: 0 COWS:     Musculoskeletal: Strength & Muscle Tone: within normal limits Gait & Station: normal Patient leans: N/A  Psychiatric Specialty Exam: Physical Exam  Nursing note and vitals reviewed. Constitutional: He appears well-developed and well-nourished.  HENT:  Head: Normocephalic and atraumatic.  Eyes: Pupils are equal, round, and reactive to light. Conjunctivae are normal.  Neck: Normal range of motion.  Cardiovascular: Regular rhythm and normal heart sounds.  Respiratory: Effort normal. No respiratory distress.  GI: Soft.  Musculoskeletal: Normal range of motion.  Neurological: He is alert.  Skin: Skin is warm and dry.  Psychiatric: He has a normal mood and affect. His behavior is normal. Judgment and thought content normal.    Review of Systems  Constitutional: Negative.   HENT: Negative.   Eyes: Negative.   Respiratory: Negative.   Cardiovascular: Negative.   Gastrointestinal: Negative.   Musculoskeletal: Negative.   Skin: Negative.   Neurological: Negative.   Psychiatric/Behavioral: Positive for substance abuse. Negative for depression, hallucinations, memory loss and suicidal ideas. The patient is not nervous/anxious and does not have insomnia.     Blood pressure (!) 134/91, pulse 85, temperature 98 F (36.7 C), temperature source Oral, resp. rate 18, height 5' (1.524 m), weight 67.1 kg, SpO2 100 %.Body mass index is 28.9 kg/m.  General Appearance: Casual  Eye Contact:  Good  Speech:  Clear and Coherent  Volume:  Normal  Mood:  Euthymic  Affect:  Congruent  Thought Process:  Goal Directed  Orientation:  Full (Time, Place, and Person)  Thought Content:  Logical  Suicidal Thoughts:  No  Homicidal Thoughts:  No  Memory:  Immediate;   Fair Recent;    Fair Remote;   Fair  Judgement:  Fair  Insight:  Fair  Psychomotor Activity:  Normal  Concentration:  Concentration: Fair  Recall:  [employer] of Knowledge:  Fair  Language:  Fair  Akathisia:  No  Handed:  Right  AIMS (if indicated):     Assets:  Desire for Improvement Housing Resilience Social Support  ADL's:  Intact  Cognition:  WNL  Sleep:  Number of Hours: 6.5     Have you used any form of tobacco in the last 30 days? (Cigarettes, Smokeless Tobacco, Cigars, and/or Pipes): Yes  Has this patient used any form of tobacco in the last 30 days? (Cigarettes, Smokeless Tobacco, Cigars, and/or Pipes) Yes, Yes, A prescription for an FDA-approved tobacco cessation medication was offered at discharge  and the patient refused  Blood Alcohol level:  Lab Results  Component Value Date   ETH 267 (H) 12/14/2018   ETH 190 (H) 08/18/2016    Metabolic Disorder Labs:  Lab Results  Component Value Date   HGBA1C 5.2 12/14/2018   MPG 102.54 12/14/2018   No results found for: PROLACTIN Lab Results  Component Value Date   CHOL 262 (H) 12/14/2018   TRIG 79 12/14/2018   HDL 75 12/14/2018   CHOLHDL 3.5 12/14/2018   VLDL 16 12/14/2018   LDLCALC 171 (H) 12/14/2018    See Psychiatric Specialty Exam and Suicide Risk Assessment completed by Attending Physician prior to discharge.  Discharge destination:  Home  Is patient on multiple antipsychotic therapies at discharge:  No   Has Patient had three or more failed trials of antipsychotic monotherapy by history:  No  Recommended Plan for Multiple Antipsychotic Therapies: NA  Discharge Instructions    Diet - low sodium heart healthy   Complete by:  As directed    Increase activity slowly   Complete by:  As directed      Allergies as of 12/17/2018   No Known Allergies     Medication List    TAKE these medications     Indication  amLODipine 5 MG tablet Commonly known as:  NORVASC Take 1 tablet (5 mg total) by mouth daily. Start  taking on:  December 18, 2018  Indication:  High Blood Pressure Disorder   lisinopril 10 MG tablet Commonly known as:  PRINIVIL,ZESTRIL Take 1 tablet (10 mg total) by mouth daily. Start taking on:  December 18, 2018  Indication:  High Blood Pressure Disorder   naltrexone 50 MG tablet Commonly known as:  DEPADE Take 1 tablet (50 mg total) by mouth daily. Start taking on:  December 18, 2018  Indication:  Excessive Use of Alcohol   thiamine 100 MG tablet Take 1 tablet (100 mg total) by mouth daily. Start taking on:  December 18, 2018  Indication:  Deficiency of Vitamin B1      Follow-up Information    [facility] Follow up on 12/21/2018.   Why:  Unk PintoHarvey Bryant, [facility], will pick you up at 7am on Friday, 12/21/18, and transport you to your hospital discharge appt.  Please bring photo ID and your hospital discharge paperwork.  Contact information: [address] [place] [address] [phone]           Follow-up recommendations:  Activity:  Activity as tolerated Diet:  Heart healthy diet Other:  Stop drinking alcohol.  Continue current medicine.  Follow-up with [facility] at appointment as scheduled on Friday  Comments: Patient is agreeable to plan.  Signed: Mordecai RasmussenJohn Cornell Gaber, MD 12/17/2018, 1:16 PM

## 2018-12-17 NOTE — Progress Notes (Signed)
  [facility] Adult Case Management Discharge Plan :  Will you be returning to the same living situation after discharge:  Yes,  pt is returning to his apartment At discharge, do you have transportation home?: Yes,  family member Do you have the ability to pay for your medications: Yes,  Medicaid  Release of information consent forms completed and in the chart;  Patient's signature needed at discharge.  Patient to Follow up at: Follow-up Information    [facility] Follow up on 12/21/2018.   Why:  Unk Pinto, [facility], will pick you up at 7am on Friday, 12/21/18, and transport you to your hospital discharge appt.  Please bring photo ID and your hospital discharge paperwork.  Contact information: [address] [place] [address] [phone]           Next level of care provider has access to [facility] Link:no  Safety Planning and Suicide Prevention discussed: Yes,  SPE completed with the patients family.  Have you used any form of tobacco in the last 30 days? (Cigarettes, Smokeless Tobacco, Cigars, and/or Pipes): Yes  Has patient been referred to the Quitline?: Patient refused referral  Patient has been referred for addiction treatment: Yes  Harden Mo, LCSW 12/17/2018, 2:01 PM

## 2018-12-17 NOTE — [facility] Suicide Risk Assessment (Signed)
[facility] Discharge Suicide Risk Assessment   Principal Problem: Adjustment disorder with mixed disturbance of emotions and conduct Discharge Diagnoses: Principal Problem:   Adjustment disorder with mixed disturbance of emotions and conduct Active Problems:   Suicidal ideation   Alcohol abuse   Hypertension   Total Time spent with patient: 45 minutes  Musculoskeletal: Strength & Muscle Tone: within normal limits Gait & Station: normal Patient leans: N/A  Psychiatric Specialty Exam: Review of Systems  Constitutional: Negative.   HENT: Negative.   Eyes: Negative.   Respiratory: Negative.   Cardiovascular: Negative.   Gastrointestinal: Negative.   Musculoskeletal: Negative.   Skin: Negative.   Neurological: Negative.   Psychiatric/Behavioral: Positive for substance abuse. Negative for depression, hallucinations, memory loss and suicidal ideas. The patient is not nervous/anxious and does not have insomnia.     Blood pressure (!) 134/91, pulse 85, temperature 98 F (36.7 C), temperature source Oral, resp. rate 18, height 5' (1.524 m), weight 67.1 kg, SpO2 100 %.Body mass index is 28.9 kg/m.  General Appearance: Casual  Eye Contact::  Fair  Speech:  Normal Rate409  Volume:  Normal  Mood:  Euthymic  Affect:  Congruent  Thought Process:  Goal Directed  Orientation:  Full (Time, Place, and Person)  Thought Content:  Logical  Suicidal Thoughts:  No  Homicidal Thoughts:  No  Memory:  Immediate;   Fair Recent;   Fair Remote;   Fair  Judgement:  Impaired  Insight:  Shallow  Psychomotor Activity:  Decreased  Concentration:  Fair  Recall:  [employer] of Knowledge:Fair  Language: Fair  Akathisia:  No  Handed:  Right  AIMS (if indicated):     Assets:  Desire for Improvement Housing Physical Health Resilience Social Support  Sleep:  Number of Hours: 6.5  Cognition: WNL  ADL's:  Intact   Mental Status Per Nursing Assessment::   On Admission:  NA  Demographic Factors:   Living alone and Unemployed  Loss Factors: NA  Historical Factors: Impulsivity  Risk Reduction Factors:   Religious beliefs about death, Positive social support and Positive coping skills or problem solving skills  Continued Clinical Symptoms:  Alcohol/Substance Abuse/Dependencies  Cognitive Features That Contribute To Risk:  Loss of executive function    Suicide Risk:  Minimal: No identifiable suicidal ideation.  Patients presenting with no risk factors but with morbid ruminations; may be classified as minimal risk based on the severity of the depressive symptoms  Follow-up Information    [facility] Follow up on 12/21/2018.   Why:  Unk Pinto, [facility], will pick you up at 7am on Friday, 12/21/18, and transport you to your hospital discharge appt.  Please bring photo ID and your hospital discharge paperwork.  Contact information: [address] [place] [address] [phone]           Plan Of Care/Follow-up recommendations:  Activity:  Activity as tolerated Diet:  Heart healthy diet Other:  Follow-up with [facility] as requested continue current medicine  Mordecai Rasmussen, MD 12/17/2018, 1:12 PM

## 2018-12-17 NOTE — Progress Notes (Signed)
Patient ID: Fred [name], male   DOB: 1976-09-18, 43 y.o.   MRN: [id number]   Discharge Note:  Patient denies SI/HI/AVH at this time. Discharge instructions, AVS, prescriptions, and transition record gone over with patient. Patient agrees to comply with medication management, follow-up visit, and outpatient therapy. Patient belongings returned to patient. Patient questions and concerns addressed and answered. Patient ambulatory off unit. Patient discharged to home with neighbor.

## 2018-12-17 NOTE — Tx Team (Signed)
Interdisciplinary Treatment and Diagnostic Plan Update  12/17/2018 Time of Session: 1450 Fred [name] MRN: [id number]  Principal Diagnosis: Adjustment disorder with mixed disturbance of emotions and conduct  Secondary Diagnoses: Principal Problem:   Adjustment disorder with mixed disturbance of emotions and conduct Active Problems:   Suicidal ideation   Alcohol abuse   Hypertension   Current Medications:  Current Facility-Administered Medications  Medication Dose Route Frequency Provider Last Rate Last Dose  . acetaminophen (TYLENOL) tablet 650 mg  650 mg Oral Q6H PRN Clapacs, John T, MD      . alum & mag hydroxide-simeth (MAALOX/MYLANTA) 200-200-20 MG/5ML suspension 30 mL  30 mL Oral Q4H PRN Clapacs, John T, MD      . amLODipine (NORVASC) tablet 5 mg  5 mg Oral Daily Donata Clay R, DO   5 mg at 12/17/18 9798  . folic acid (FOLVITE) tablet 1 mg  1 mg Oral Daily Clapacs, John T, MD   1 mg at 12/17/18 9211  . hydrOXYzine (ATARAX/VISTARIL) tablet 25 mg  25 mg Oral TID PRN Clapacs, John T, MD      . lisinopril (PRINIVIL,ZESTRIL) tablet 10 mg  10 mg Oral Daily Clapacs, Jackquline Denmark, MD   10 mg at 12/17/18 0811  . LORazepam (ATIVAN) tablet 1 mg  1 mg Oral Q6H PRN Clapacs, Jackquline Denmark, MD       Or  . LORazepam (ATIVAN) injection 1 mg  1 mg Intravenous Q6H PRN Clapacs, John T, MD      . magnesium hydroxide (MILK OF MAGNESIA) suspension 30 mL  30 mL Oral Daily PRN Clapacs, John T, MD      . multivitamin with minerals tablet 1 tablet  1 tablet Oral Daily Clapacs, Jackquline Denmark, MD   1 tablet at 12/17/18 0811  . naltrexone (DEPADE) tablet 50 mg  50 mg Oral Daily Donata Clay R, DO   50 mg at 12/17/18 9417  . thiamine (VITAMIN B-1) tablet 100 mg  100 mg Oral Daily Clapacs, John T, MD   100 mg at 12/17/18 4081   Or  . thiamine (B-1) injection 100 mg  100 mg Intravenous Daily Clapacs, John T, MD      . traZODone (DESYREL) tablet 100 mg  100 mg Oral QHS PRN Clapacs, Jackquline Denmark, MD   100 mg at 12/15/18 2136    PTA Medications: No medications prior to admission.    Patient Stressors:    Patient Strengths: Ability for insight Active sense of humor Communication skills Motivation for treatment/growth Physical Health  Treatment Modalities: Medication Management, Group therapy, Case management,  1 to 1 session with clinician, Psychoeducation, Recreational therapy.   Physician Treatment Plan for Primary Diagnosis: Adjustment disorder with mixed disturbance of emotions and conduct Long Term Goal(s):     Short Term Goals:    Medication Management: Evaluate patient's response, side effects, and tolerance of medication regimen.  Therapeutic Interventions: 1 to 1 sessions, Unit Group sessions and Medication administration.  Evaluation of Outcomes: Adequate for Discharge  Physician Treatment Plan for Secondary Diagnosis: Principal Problem:   Adjustment disorder with mixed disturbance of emotions and conduct Active Problems:   Suicidal ideation   Alcohol abuse   Hypertension  Long Term Goal(s):     Short Term Goals:       Medication Management: Evaluate patient's response, side effects, and tolerance of medication regimen.  Therapeutic Interventions: 1 to 1 sessions, Unit Group sessions and Medication administration.  Evaluation of Outcomes: Adequate for Discharge  RN Treatment Plan for Primary Diagnosis: Adjustment disorder with mixed disturbance of emotions and conduct Long Term Goal(s): Knowledge of disease and therapeutic regimen to maintain health will improve  Short Term Goals: Ability to identify and develop effective coping behaviors will improve and Compliance with prescribed medications will improve  Medication Management: RN will administer medications as ordered by provider, will assess and evaluate patient's response and provide education to patient for prescribed medication. RN will report any adverse and/or side effects to prescribing provider.  Therapeutic  Interventions: 1 on 1 counseling sessions, Psychoeducation, Medication administration, Evaluate responses to treatment, Monitor vital signs and CBGs as ordered, Perform/monitor CIWA, COWS, AIMS and Fall Risk screenings as ordered, Perform wound care treatments as ordered.  Evaluation of Outcomes: Adequate for Discharge   LCSW Treatment Plan for Primary Diagnosis: Adjustment disorder with mixed disturbance of emotions and conduct Long Term Goal(s): Safe transition to appropriate next level of care at discharge, Engage patient in therapeutic group addressing interpersonal concerns.  Short Term Goals: Engage patient in aftercare planning with referrals and resources, Increase social support and Increase skills for wellness and recovery  Therapeutic Interventions: Assess for all discharge needs, 1 to 1 time with Social worker, Explore available resources and support systems, Assess for adequacy in community support network, Educate family and significant other(s) on suicide prevention, Complete Psychosocial Assessment, Interpersonal group therapy.  Evaluation of Outcomes: Adequate for Discharge   Progress in Treatment: Attending groups: No. Participating in groups: No. Taking medication as prescribed: Yes. Toleration medication: Yes. Family/Significant other contact made: Yes, individual(s) contacted:  friend Patient understands diagnosis: Yes. Discussing patient identified problems/goals with staff: Yes. Medical problems stabilized or resolved: Yes. Denies suicidal/homicidal ideation: Yes. Issues/concerns per patient self-inventory: No. Other:   New problem(s) identified: No, Describe:  none  New Short Term/Long Term Goal(s):  Patient Goals:  "make myself a better person"  Discharge Plan or Barriers:   Reason for Continuation of Hospitalization: Other; describe none: discharge today  Estimated Length of Stay:  Attendees: Patient:Fred [name] 12/17/2018   Physician: Dr.  Toni Amend, MD 12/17/2018   Nursing: Hurley Cisco, RN 12/17/2018   RN Care Manager: 12/17/2018   Social Worker: Daleen Squibb, LCSW 12/17/2018   Recreational Therapist: Garret Reddish, LRT-CTRS 12/17/2018   Other:  12/17/2018   Other:  12/17/2018   Other: 12/17/2018          Scribe for Treatment Team: Lorri Frederick, LCSW 12/17/2018 2:55 PM

## 2018-12-17 NOTE — Progress Notes (Signed)
D- Patient alert and oriented. Patient presents in a pleasant mood on assessment stating that he slept "good" last night and had no major complaints or concerns to voice to this Clinical research associate. Patient denies any signs/symptoms of depression/anxiety and states to this writer that overall he is feeling "excellent". Patient also denies SI, HI, AVH, and pain at this time. Patient's goal for today is "going home".  A- Scheduled medications administered to patient, per MD orders. Support and encouragement provided.  Routine safety checks conducted every 15 minutes.  Patient informed to notify staff with problems or concerns.  R- No adverse drug reactions noted. Patient contracts for safety at this time. Patient compliant with medications and treatment plan. Patient receptive, calm, and cooperative. Patient interacts well with others on the unit.  Patient remains safe at this time.

## 2018-12-17 NOTE — Plan of Care (Signed)
Pleasant and cooperative. Expressing readiness for discharge.

## 2018-12-17 NOTE — Progress Notes (Signed)
Patient stayed in his room but pleasant and cooperative. Frequently asking when he will be discharged. Patient expressed remorse related to the behaviors that precipitated his admission " I don't know why I did that..I was just being stupid". Patient denied suicidal/homicidal thoughts. Denying hallucinations. Currently out of bed for morning routines. Safety monitored.

## 2020-10-29 ENCOUNTER — Other Ambulatory Visit: Payer: Self-pay

## 2020-10-29 ENCOUNTER — Emergency Department: Payer: Medicaid Other

## 2020-10-29 ENCOUNTER — Emergency Department
Admission: EM | Admit: 2020-10-29 | Discharge: 2020-10-30 | Disposition: A | Discharge status: Home / Self Care | Payer: Medicaid Other | Attending: Emergency Medicine | Admitting: Emergency Medicine

## 2020-10-29 DIAGNOSIS — F10129 Alcohol abuse with intoxication, unspecified: Secondary | ICD-10-CM | POA: Insufficient documentation

## 2020-10-29 DIAGNOSIS — X58XXXA Exposure to other specified factors, initial encounter: Secondary | ICD-10-CM | POA: Insufficient documentation

## 2020-10-29 DIAGNOSIS — F1910 Other psychoactive substance abuse, uncomplicated: Secondary | ICD-10-CM

## 2020-10-29 DIAGNOSIS — F17210 Nicotine dependence, cigarettes, uncomplicated: Secondary | ICD-10-CM | POA: Diagnosis not present

## 2020-10-29 DIAGNOSIS — Z79899 Other long term (current) drug therapy: Secondary | ICD-10-CM | POA: Diagnosis not present

## 2020-10-29 DIAGNOSIS — I10 Essential (primary) hypertension: Secondary | ICD-10-CM | POA: Insufficient documentation

## 2020-10-29 DIAGNOSIS — S0990XA Unspecified injury of head, initial encounter: Secondary | ICD-10-CM | POA: Diagnosis present

## 2020-10-29 DIAGNOSIS — S0101XA Laceration without foreign body of scalp, initial encounter: Secondary | ICD-10-CM | POA: Insufficient documentation

## 2020-10-29 DIAGNOSIS — F10929 Alcohol use, unspecified with intoxication, unspecified: Secondary | ICD-10-CM

## 2020-10-29 LAB — COMPREHENSIVE METABOLIC PANEL
ALT: 29 U/L (ref 0–44)
AST: 30 U/L (ref 15–41)
Albumin: 4.4 g/dL (ref 3.5–5.0)
Alkaline Phosphatase: 111 U/L (ref 38–126)
Anion gap: 12 (ref 5–15)
BUN: 12 mg/dL (ref 6–20)
CO2: 22 mmol/L (ref 22–32)
Calcium: 8.9 mg/dL (ref 8.9–10.3)
Chloride: 106 mmol/L (ref 98–111)
Creatinine, Ser: 1.1 mg/dL (ref 0.61–1.24)
GFR, Estimated: >60 mL/min (ref >60)
Glucose, Bld: 112 mg/dL — ABNORMAL HIGH (ref 70–99)
Potassium: 3.4 mmol/L — ABNORMAL LOW (ref 3.5–5.1)
Sodium: 140 mmol/L (ref 135–145)
Total Bilirubin: 0.4 mg/dL (ref 0.3–1.2)
Total Protein: 8.1 g/dL (ref 6.5–8.1)

## 2020-10-29 LAB — URINE DRUG SCREEN, QUALITATIVE (ARMC ONLY)
Amphetamines, Ur Screen: NOT DETECTED
Barbiturates, Ur Screen: NOT DETECTED
Benzodiazepine, Ur Scrn: NOT DETECTED
Cannabinoid 50 Ng, Ur ~~LOC~~: POSITIVE — AB
Cocaine Metabolite,Ur ~~LOC~~: POSITIVE — AB
MDMA (Ecstasy)Ur Screen: NOT DETECTED
Methadone Scn, Ur: NOT DETECTED
Opiate, Ur Screen: NOT DETECTED
Phencyclidine (PCP) Ur S: NOT DETECTED
Tricyclic, Ur Screen: NOT DETECTED

## 2020-10-29 LAB — CBC WITH DIFFERENTIAL/PLATELET
Abs Immature Granulocytes: 0.02 10*3/uL (ref 0.00–0.07)
Basophils Absolute: 0 10*3/uL (ref 0.0–0.1)
Basophils Relative: 0 %
Eosinophils Absolute: 0.2 10*3/uL (ref 0.0–0.5)
Eosinophils Relative: 2 %
HCT: 48.9 % (ref 39.0–52.0)
Hemoglobin: 16.4 g/dL (ref 13.0–17.0)
Immature Granulocytes: 0 %
Lymphocytes Relative: 22 %
Lymphs Abs: 2.2 10*3/uL (ref 0.7–4.0)
MCH: 29.9 pg (ref 26.0–34.0)
MCHC: 33.5 g/dL (ref 30.0–36.0)
MCV: 89.1 fL (ref 80.0–100.0)
Monocytes Absolute: 0.7 10*3/uL (ref 0.1–1.0)
Monocytes Relative: 8 %
Neutro Abs: 6.7 10*3/uL (ref 1.7–7.7)
Neutrophils Relative %: 68 %
Platelets: 310 10*3/uL (ref 150–400)
RBC: 5.49 MIL/uL (ref 4.22–5.81)
RDW: 12.6 % (ref 11.5–15.5)
WBC: 9.9 10*3/uL (ref 4.0–10.5)
nRBC: 0 % (ref 0.0–0.2)

## 2020-10-29 LAB — ETHANOL: Alcohol, Ethyl (B): 333 mg/dL (ref <10)

## 2020-10-29 MED ORDER — AMLODIPINE BESYLATE 5 MG PO TABS
5.0000 mg | ORAL_TABLET | Freq: Once | ORAL | Status: AC
  Administered 2020-10-29: 5 mg via ORAL
  Filled 2020-10-29: qty 1

## 2020-10-29 MED ORDER — LACTATED RINGERS IV BOLUS
1000.0000 mL | Freq: Once | INTRAVENOUS | Status: AC
  Administered 2020-10-29: 1000 mL via INTRAVENOUS

## 2020-10-29 MED ORDER — BACITRACIN ZINC 500 UNIT/GM EX OINT
TOPICAL_OINTMENT | Freq: Once | CUTANEOUS | Status: AC
  Filled 2020-10-29: qty 1.8

## 2020-10-29 MED ORDER — LISINOPRIL 10 MG PO TABS
10.0000 mg | ORAL_TABLET | Freq: Once | ORAL | Status: AC
  Administered 2020-10-29: 10 mg via ORAL
  Filled 2020-10-29: qty 1

## 2020-10-29 NOTE — ED Provider Notes (Signed)
[facility] Emergency Department Provider Note ____________________________________________   First MD Initiated Contact with Patient 10/29/20 2157     (approximate)  I have reviewed the triage vital signs and the nursing notes.  HISTORY  Chief Complaint Altered Mental Status   HPI Fred [name] is a 44 y.o. malewho presents to the ED for evaluation of altered mentation.   Chart review indicates hx HTN   Patient presents to the ED clinically intoxicated from home.  EMS was called by neighbors because they had difficulty contacting the patient.  EMS reports finding the patient in his bed at home with a scalp laceration, of uncertain chronicity and etiology.  Patient is unable to provide any significant history due to his clinical intoxication and altered mental status.  He denies pain and repeatedly indicates that he is a big man.  Past Medical History:  Diagnosis Date  . Hypertension   . Medical history non-contributory   . Scoliosis     Patient Active Problem List   Diagnosis Date Noted  . Suicidal ideation 12/14/2018  . Alcohol abuse 12/14/2018  . Hypertension 12/14/2018  . 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 Adjustment disorder with mixed anxiety and depressed mood 02/14/2013    Past Surgical History:  Procedure Laterality Date  . NO PAST SURGERIES      Prior to Admission medications   Medication Sig Start Date End Date Taking? Authorizing Provider  amLODipine (NORVASC) 5 MG tablet Take 1 tablet (5 mg total) by mouth daily. 12/18/18   Clapacs, Jackquline Denmark, MD  lisinopril (PRINIVIL,ZESTRIL) 10 MG tablet Take 1 tablet (10 mg total) by mouth daily. 12/18/18   Clapacs, Jackquline Denmark, MD  naltrexone (DEPADE) 50 MG tablet Take 1 tablet (50 mg total) by mouth daily. 12/18/18   Clapacs, Jackquline Denmark, MD  thiamine 100 MG tablet Take 1 tablet (100 mg total) by mouth daily. 12/18/18   Clapacs, Jackquline Denmark, MD     Allergies Patient has no known allergies.  History reviewed. No pertinent family history.  Social History Social History   Tobacco Use  . Smoking status: Current Every Day Smoker    Years: 20.00    Types: Cigarettes  . Smokeless tobacco: Never Used  . Tobacco comment: pt refused  Substance Use Topics  . Alcohol use: Yes    Alcohol/week: 1.0 standard drink    Types: 1 Cans of beer per week  . Drug use: Yes    Frequency: 1.0 times per week    Types: Marijuana    Review of Systems  Unable to be accurately assessed due to patient's intoxicated clinical state and altered mental status ____________________________________________   PHYSICAL EXAM:  VITAL SIGNS: Vitals:   10/29/20 2154  BP: (!) 188/122  Pulse: 87  Resp: 16  Temp: 98.5 F (36.9 C)  SpO2: 98%     Constitutional: Clinically intoxicated.  Intermittently falling asleep midsentence, and then waking up and chastising nurses.  He is redirectable, and follows commands in all 4 extremities. Eyes: Conjunctivae are normal. PERRL. EOMI. Head: 5 cm semilunate laceration to right sided occiput, hemostatic with direct pressure.  Small underlying hematoma.  No bony step-offs appreciated. Nose: No congestion/rhinnorhea. Mouth/Throat: Mucous membranes are dry.  Oropharynx non-erythematous. Neck: No stridor. No cervical spine tenderness to palpation. Cardiovascular: Normal rate, regular rhythm. Grossly normal heart sounds.  Good peripheral circulation. Respiratory: Normal respiratory effort.  No retractions. Lungs CTAB. Gastrointestinal: Soft , nondistended, nontender to palpation.  No CVA tenderness. Musculoskeletal: No lower extremity tenderness nor edema. No joint effusions. Neurologic:  No gross focal neurologic deficits are appreciated.  Cranial nerves intact Skin:  Skin is warm, dry and intact. No rash noted. Psychiatric: Mood and affect are normal. Speech and behavior are  normal.  ____________________________________________   LABS (all labs ordered are listed, but only abnormal results are displayed)  Labs Reviewed  COMPREHENSIVE METABOLIC PANEL - Abnormal; Notable for the following components:      Result Value   Potassium 3.4 (*)    Glucose, Bld 112 (*)    All other components within normal limits  ETHANOL - Abnormal; Notable for the following components:   Alcohol, Ethyl (B) 333 (*)    All other components within normal limits  URINE DRUG SCREEN, QUALITATIVE ([facility] ONLY) - Abnormal; Notable for the following components:   Cocaine Metabolite,Ur [place] POSITIVE (*)    Cannabinoid 50 Ng, Ur [place] POSITIVE (*)    All other components within normal limits  CBC WITH DIFFERENTIAL/PLATELET   ____________________________________________  RADIOLOGY  ED MD interpretation: CT head and C-spine reviewed by me, degraded by motion artifact, but without evidence of acute intracranial pathology or cervical fracture.  Official radiology report(s): CT Head Wo Contrast  Result Date: 10/29/2020 CLINICAL DATA:  Abnormal mental status.  Occipital trauma. EXAM: CT HEAD WITHOUT CONTRAST TECHNIQUE: Contiguous axial images were obtained from the base of the skull through the vertex without intravenous contrast. COMPARISON:  03/27/2015 FINDINGS: Brain: Image quality degraded by patient motion despite repeating. No acute intracranial abnormality. Specifically, no hemorrhage, hydrocephalus, mass lesion, acute infarction, or significant intracranial injury. Vascular: No hyperdense vessel or unexpected calcification. Skull: No acute calvarial abnormality. Sinuses/Orbits: Not well visualized due to motion. Other: None IMPRESSION: Extensive artifact and poor study quality due to patient motion. No definite acute intracranial process. Electronically Signed   By: Charlett Nose M.D.   On: 10/29/2020 22:38   CT Cervical Spine Wo Contrast  Result Date: 10/29/2020 CLINICAL DATA:  Neck trauma  EXAM: CT CERVICAL SPINE WITHOUT CONTRAST TECHNIQUE: Multidetector CT imaging of the cervical spine was performed without intravenous contrast. Multiplanar CT image reconstructions were also generated. COMPARISON:  None. FINDINGS: Alignment: Loss of cervical lordosis.  No subluxation. Skull base and vertebrae: No acute fracture. No primary bone lesion or focal pathologic process. Soft tissues and spinal canal: No prevertebral fluid or swelling. No visible canal hematoma. Disc levels:  Normal Upper chest: Negative Other: None IMPRESSION: Loss of cervical lordosis.  No acute bony abnormality Electronically Signed   By: Charlett Nose M.D.   On: 10/29/2020 22:47    ____________________________________________   PROCEDURES and INTERVENTIONS  Procedure(s) performed (including Critical Care):  .1-3 Lead EKG Interpretation Performed by: Delton Prairie, MD Authorized by: Delton Prairie, MD     Interpretation: normal     ECG rate:  85   ECG rate assessment: normal     Rhythm: sinus rhythm     Ectopy: none     Conduction: normal    ..Laceration Repair  Date/Time: 10/29/2020 11:02 PM Performed by: Delton Prairie, MD Authorized by: Delton Prairie, MD   Consent:    Consent obtained:  Verbal   Consent given by:  Patient   Risks discussed:  Infection, pain, poor cosmetic result and retained foreign body   Alternatives discussed:  No treatment Anesthesia (see MAR for exact dosages):    Anesthesia method:  None Laceration details:    Location:  Scalp   Scalp location:  Occipital   Length (cm):  5 Repair type:    Repair type:  Simple Exploration:    Hemostasis achieved with:  Direct pressure   Contaminated: no   Treatment:    Area cleansed with:  Soap and water   Amount of cleaning:  Standard   Visualized foreign bodies/material removed: no   Skin repair:    Repair method:  Staples   Number of staples:  6 Approximation:    Approximation:  Close Post-procedure details:    Dressing:  Antibiotic  ointment   Patient tolerance of procedure:  Tolerated well, no immediate complications    Medications  lactated ringers bolus 1,000 mL (1,000 mLs Intravenous New Bag/Given 10/29/20 2225)  bacitracin ointment ( Topical Return to [facility] 10/29/20 2253)    ____________________________________________   MDM / ED COURSE   44 year old male presents to the ED clinically intoxicated with a scalp laceration, without evidence of acute intracranial pathology, requiring further observation in the ED to achieve clinical sobriety.  Slightly hypertensive, otherwise normal vitals on room air.  Exam with a clinical intoxicated patient who has no evidence of neurologic deficits or significant trauma beyond his scalp.  He has a laceration into the dermis of his right scalp, repaired with 6 staples, as above.  Due to patient's inability to provide any significant history, CT head and neck obtained and without evidence of acute intracranial pathology or cervical fracture.  His blood work confirms ethanol intoxication with elevated serum level, otherwise his blood work is largely unremarkable.  UDS also with cocaine and cannabis.  Patient receiving IV fluids and has no indications for IVC at this time.  Patient signed out to oncoming provider to follow-up his mental status to ensure he achieves clinical sobriety and to reassess his psychiatric state.  Anticipate possible discharge as long as she is clinical sobriety and has no developments of psychiatric emergency such as suicidality.      ____________________________________________   FINAL CLINICAL IMPRESSION(S) / ED DIAGNOSES  Final diagnoses:  Alcoholic intoxication with complication (HCC)  Scalp laceration, initial encounter     ED Discharge Orders    None       Almarie Kurdziel   Note:  This document was prepared using Dragon voice recognition software and may include unintentional dictation errors.   Delton Prairie, MD 10/29/20 [phone]

## 2020-10-29 NOTE — ED Provider Notes (Signed)
-----------------------------------------   11:03 PM on 10/29/2020 -----------------------------------------  Assuming care from Dr. Adaline Sill.  In short, Fred [name] is a 44 y.o. male with a chief complaint of alcohol intoxication.  Refer to the original H&P for additional details.  The current plan of care is to monitor for safety until clinically sober.   ----------------------------------------- 3:10 AM on 10/30/2020 -----------------------------------------  Patient is ambulatory, steady of gait, eat a meal, and wants to go home.  He has sufficiently clinically sober to go home in the company of a sober adult and there is someone waiting for him to take him home.  I gave him return precautions and recommendations for having his scalp staples removed in about a week.   Loleta Rose, MD 10/30/20 [phone]

## 2020-10-29 NOTE — ED Triage Notes (Signed)
Pt presents to ER via ems from home.  Pt reportedly found by neighbors in his house, going in and out of consciousness.  Pt slurring speech, intermittently falling asleep and having snoring respiration.  Unknown whether pt has ETOH onboard.  Pt has laceration to posterior head w/bleeding controlled.

## 2020-10-30 MED ORDER — ACETAMINOPHEN 500 MG PO TABS
1000.0000 mg | ORAL_TABLET | Freq: Once | ORAL | Status: AC
  Administered 2020-10-30: 1000 mg via ORAL
  Filled 2020-10-30: qty 2

## 2020-10-30 NOTE — Discharge Instructions (Signed)
Please return to an urgent care in 7-10 days to have the staples removed from your scalp.  Please avoid drugs and alcohol.  Drink plenty of clear, non-alcoholic fluids today to get rehydrated.

## 2020-10-30 NOTE — ED Notes (Signed)
Tyrell (nephew) phone #[phone].  Call when ready for DC.

## 2020-11-11 ENCOUNTER — Emergency Department
Admission: EM | Admit: 2020-11-11 | Discharge: 2020-11-11 | Disposition: A | Discharge status: Home / Self Care | Payer: Medicaid Other | Attending: Emergency Medicine | Admitting: Emergency Medicine

## 2020-11-11 ENCOUNTER — Other Ambulatory Visit: Payer: Self-pay

## 2020-11-11 ENCOUNTER — Encounter: Payer: Self-pay | Admitting: Emergency Medicine

## 2020-11-11 DIAGNOSIS — I10 Essential (primary) hypertension: Secondary | ICD-10-CM | POA: Insufficient documentation

## 2020-11-11 DIAGNOSIS — Z9119 Patient's noncompliance with other medical treatment and regimen: Secondary | ICD-10-CM | POA: Diagnosis not present

## 2020-11-11 DIAGNOSIS — F17210 Nicotine dependence, cigarettes, uncomplicated: Secondary | ICD-10-CM | POA: Diagnosis not present

## 2020-11-11 DIAGNOSIS — Z4802 Encounter for removal of sutures: Secondary | ICD-10-CM | POA: Diagnosis present

## 2020-11-11 DIAGNOSIS — Z79899 Other long term (current) drug therapy: Secondary | ICD-10-CM | POA: Diagnosis not present

## 2020-11-11 DIAGNOSIS — X58XXXD Exposure to other specified factors, subsequent encounter: Secondary | ICD-10-CM | POA: Diagnosis not present

## 2020-11-11 DIAGNOSIS — S0101XD Laceration without foreign body of scalp, subsequent encounter: Secondary | ICD-10-CM | POA: Diagnosis not present

## 2020-11-11 DIAGNOSIS — Z91199 Patient's noncompliance with other medical treatment and regimen due to unspecified reason: Secondary | ICD-10-CM

## 2020-11-11 MED ORDER — AMLODIPINE BESYLATE 5 MG PO TABS: 5.0000 mg | ORAL_TABLET | Freq: Every day | ORAL | 1 refills | Status: DC

## 2020-11-11 MED ORDER — LISINOPRIL 10 MG PO TABS: 10.0000 mg | ORAL_TABLET | Freq: Every day | ORAL | 1 refills | Status: DC

## 2020-11-11 NOTE — ED Triage Notes (Signed)
Staple removal from head.  Well approximated.  Clean and dry.

## 2020-11-11 NOTE — ED Notes (Signed)
ED Provider at bedside. 

## 2020-11-11 NOTE — ED Provider Notes (Signed)
[facility] Emergency Department Provider Note   ____________________________________________   First MD Initiated Contact with Patient 11/11/20 1016     (approximate)  I have reviewed the triage vital signs and the nursing notes.   HISTORY  Chief Complaint Suture / Staple Removal   HPI Fred [name] is a 44 y.o. male is here for staple removal.  Patient was seen in the ED on 10/29/2020 for alcohol intoxication and laceration to his scalp.  He denies any problems.     Past Medical History:  Diagnosis Date  . Hypertension   . Medical history non-contributory   . Scoliosis     Patient Active Problem List   Diagnosis Date Noted  . Suicidal ideation 12/14/2018  . Alcohol abuse 12/14/2018  . Hypertension 12/14/2018  . 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 Adjustment disorder with mixed anxiety and depressed mood 02/14/2013    Past Surgical History:  Procedure Laterality Date  . NO PAST SURGERIES      Prior to Admission medications   Medication Sig Start Date End Date Taking? Authorizing Provider  amLODipine (NORVASC) 5 MG tablet Take 1 tablet (5 mg total) by mouth daily. 11/11/20   Tommi Rumps, PA-C  lisinopril (ZESTRIL) 10 MG tablet Take 1 tablet (10 mg total) by mouth daily. 11/11/20   Tommi Rumps, PA-C  naltrexone (DEPADE) 50 MG tablet Take 1 tablet (50 mg total) by mouth daily. 12/18/18   Clapacs, Jackquline Denmark, MD  thiamine 100 MG tablet Take 1 tablet (100 mg total) by mouth daily. 12/18/18   Clapacs, Jackquline Denmark, MD    Allergies Patient has no known allergies.  No family history on file.  Social History Social History   Tobacco Use  . Smoking status: Current Every Day Smoker    Years: 20.00    Types: Cigarettes  . Smokeless tobacco: Never Used  . Tobacco comment: pt refused  Substance Use Topics  . Alcohol use: Yes    Alcohol/week: 1.0 standard drink    Types: 1 Cans of beer  per week  . Drug use: Yes    Frequency: 1.0 times per week    Types: Marijuana    Review of Systems Constitutional: No fever/chills Eyes: No visual changes. ENT: No trauma. Cardiovascular: Denies chest pain. Respiratory: Denies shortness of breath. Gastrointestinal: No abdominal pain.  No nausea, no vomiting.  Musculoskeletal: Negative for skeletal skeletal pain.  Skin: Positive for staples. Neurological: Negative for headaches, focal weakness or numbness. Psychological adjustment disorder/anxiety, history of alcohol abuse. ___________________________________________   PHYSICAL EXAM:  VITAL SIGNS: ED Triage Vitals  Enc Vitals Group     BP 11/11/20 1009 (!) 208/123     Pulse Rate 11/11/20 1009 81     Resp 11/11/20 1009 16     Temp 11/11/20 1009 98.8 F (37.1 C)     Temp Source 11/11/20 1009 Oral     SpO2 11/11/20 1009 100 %     Weight 11/11/20 1010 154 lb 15.7 oz (70.3 kg)     Height 11/11/20 1010 5' (1.524 m)     Head Circumference --      Peak Flow --      Pain Score 11/11/20 1009 0     Pain Loc --      Pain Edu? --      Excl. in GC? --     Constitutional: Alert and oriented. Well appearing and in no acute distress. Eyes:  Conjunctivae are normal. PERRL. EOMI. Head: Atraumatic. Neck: No stridor.   Cardiovascular: Normal rate, regular rhythm. Grossly normal heart sounds.  Good peripheral circulation. Respiratory: Normal respiratory effort.  No retractions. Lungs CTAB. Musculoskeletal: Moves upper and lower extremities any difficulty normal gait was noted.. Neurologic:  Normal speech and language. No gross focal neurologic deficits are appreciated. No gait instability. Skin:  Skin is warm, dry.  There is a suture line with staples on the right lateral posterior scalp without evidence of infection. Psychiatric: Mood and affect are normal. Speech and behavior are normal.  ____________________________________________   LABS (all labs ordered are listed, but only  abnormal results are displayed)  Labs Reviewed - No data to display ____________________________________________   PROCEDURES  Procedure(s) performed (including Critical Care):  Procedures Staples were removed by provider without any difficulty.  ____________________________________________   INITIAL IMPRESSION / ASSESSMENT AND PLAN / ED COURSE  As part of my medical decision making, I reviewed the following data within the electronic MEDICAL RECORD NUMBER Notes from prior ED visits and [place] Controlled Substance Database  44 year old male presents to the ED for staple removal.  Patient denies any difficulty at this time.  It was noted that patient's blood pressure was elevated when he was first brought to triage and he states that he has not taken his blood pressure medication as he is completely out.  Looking at his record it appears that Dr. Toni Amend wrote him a prescription but he did not follow-up with getting a primary care provider.  Patient's record documents that he is taking Norvasc 5 mg and lisinopril 10 mg.  A refill of these medications +65-month was written for him.  List of clinics in this area was printed on his discharge papers and patient states that he will call today to see if he can get an appointment and establish care with a PCP. ____________________________________________   FINAL CLINICAL IMPRESSION(S) / ED DIAGNOSES  Final diagnoses:  Encounter for staple removal  Hypertension, uncontrolled  Medically noncompliant     ED Discharge Orders         Ordered    amLODipine (NORVASC) 5 MG tablet  Daily        11/11/20 1107    lisinopril (ZESTRIL) 10 MG tablet  Daily        11/11/20 1107          *Please note:  Fred [name] was evaluated in Emergency Department on 11/11/2020 for the symptoms described in the history of present illness. He was evaluated in the context of the global COVID-19 pandemic, which necessitated consideration that the patient might  be at risk for infection with the SARS-CoV-2 virus that causes COVID-19. Institutional protocols and algorithms that pertain to the evaluation of patients at risk for COVID-19 are in a state of rapid change based on information released by regulatory bodies including the CDC and federal and state organizations. These policies and algorithms were followed during the patient's care in the ED.  Some ED evaluations and interventions may be delayed as a result of limited staffing during and the pandemic.*   Note:  This document was prepared using Dragon voice recognition software and may include unintentional dictation errors.    Tommi Rumps, PA-C 11/11/20 1233    Jene Every, MD 11/11/20 1242

## 2020-11-11 NOTE — Discharge Instructions (Addendum)
Follow-up with your primary care provider if any continued problems or concerns. 6 staples were removed today. When you wash your hair you will most likely see a lot of dried blood. Area is healed without any signs of infection.  Also you will need to call and make an appointment to get a primary care provider.  A refill of your blood pressure medicine was sent to your pharmacy.  This is enough for 2 months.  A list of clinics is on your discharge paper also to call to see if they are taking new patients.

## 2022-09-29 ENCOUNTER — Emergency Department: Payer: Medicaid Other

## 2022-09-29 ENCOUNTER — Other Ambulatory Visit: Payer: Self-pay

## 2022-09-29 ENCOUNTER — Emergency Department
Admission: EM | Admit: 2022-09-29 | Discharge: 2022-09-29 | Disposition: A | Discharge status: Home / Self Care | Payer: Medicaid Other | Attending: Emergency Medicine | Admitting: Emergency Medicine

## 2022-09-29 DIAGNOSIS — M25531 Pain in right wrist: Secondary | ICD-10-CM | POA: Diagnosis present

## 2022-09-29 DIAGNOSIS — W1789XA Other fall from one level to another, initial encounter: Secondary | ICD-10-CM | POA: Insufficient documentation

## 2022-09-29 DIAGNOSIS — I10 Essential (primary) hypertension: Secondary | ICD-10-CM | POA: Diagnosis not present

## 2022-09-29 MED ORDER — LISINOPRIL 10 MG PO TABS: 10.0000 mg | ORAL_TABLET | Freq: Every day | ORAL | 0 refills | Status: DC

## 2022-09-29 MED ORDER — AMLODIPINE BESYLATE 5 MG PO TABS: 5.0000 mg | ORAL_TABLET | Freq: Every day | ORAL | 0 refills | Status: DC

## 2022-09-29 MED ORDER — ACETAMINOPHEN 325 MG PO TABS
650.0000 mg | ORAL_TABLET | Freq: Once | ORAL | Status: AC
  Administered 2022-09-29: 650 mg via ORAL
  Filled 2022-09-29: qty 2

## 2022-09-29 NOTE — ED Notes (Signed)
[employer] taxi voucher given.

## 2022-09-29 NOTE — ED Provider Notes (Signed)
[facility] Provider Note    Event Date/Time   First MD Initiated Contact with Patient 09/29/22 1711     (approximate)   History   Wrist Pain   HPI  Fred [name] is a 46 y.o. male who presents today for evaluation of right wrist pain.  Patient reports that he fell off of his truck 3 weeks ago and had pain immediately in his right wrist.  He reports that this pain has persisted since that time.  He has had pain primarily to the dorsum of his wrist, but has noticed swelling in his wrist and in his hand.  He is still able to move his fingers and his hand.  He has not taken anything for his symptoms.  He denies paresthesias.  Patient was noted to be hypertensive in triage.  Patient denies headache, vision changes, chest pain, shortness of breath.  He reports that he did not take his blood pressure medication today.  He reports that he recently ran out and needs a refill of both of his blood pressure medications.    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 --      Excl. in [place]? --     Most recent vital signs: Vitals:   09/29/22 1514 09/29/22 1834  BP: (!) 201/136 (!) 193/133  Pulse: 80 77  Resp: 18 16  Temp: 98.2 F (36.8 C) 98 F (36.7 C)  SpO2: 99% 100%    Physical  Exam Vitals and nursing note reviewed.  Constitutional:      General: Awake and alert. No acute distress.    Appearance: Normal appearance. The patient is normal weight.  HENT:     Head: Normocephalic and atraumatic.     Mouth: Mucous membranes are moist.  Eyes:     General: PERRL. Normal EOMs        Right eye: No discharge.        Left eye: No discharge.     Conjunctiva/sclera: Conjunctivae normal.  Cardiovascular:     Rate and Rhythm: Normal rate and regular rhythm.     Pulses: Normal pulses.     Heart sounds: Normal heart sounds Pulmonary:     Effort: Pulmonary effort is normal. No respiratory distress.     Breath sounds: Normal breath sounds.  Abdominal:     Abdomen is soft. There is no abdominal tenderness. No rebound or guarding. No distention. Musculoskeletal:        General: No swelling. Normal range of motion.  Cervical back: Normal range of motion and neck supple.  Right upper extremity: Mild swelling to the dorsum of right wrist and dorsum of hand.  Mildly tender to palpation over the radial aspect of the wrist.  Normal radial pulse.  Normal intrinsic muscle function of the hand.  No warmth or erythema noted.  No open wounds noted.  No tenderness to the elbow, humerus, shoulder, clavicle, or AC joint.  Full and normal range of motion of shoulder and elbow.  Compartments are soft and compressible throughout the entirety of the arm.  Normal temperature to hand. Skin:    General: Skin is warm and dry.     Capillary Refill: Capillary refill takes less than 2 seconds.     Findings: No rash.  Neurological:     Mental Status: The patient is awake and alert.      ED Results / Procedures / Treatments   Labs (all labs ordered are listed, but only abnormal results are displayed) Labs Reviewed - No data to display   EKG     RADIOLOGY I independently reviewed and interpreted imaging and agree with radiologists findings.     PROCEDURES:  Critical Care performed:    Procedures   MEDICATIONS ORDERED IN ED: Medications  acetaminophen (TYLENOL) tablet 650 mg (650 mg Oral Given 09/29/22 1730)     IMPRESSION / MDM / ASSESSMENT AND PLAN / ED COURSE  I reviewed the triage vital signs and the nursing notes.   Differential diagnosis includes, but is not limited to, fracture, dislocation, contusion.  Patient is awake and alert, neurovascularly intact.  He has moderate amount of swelling to the dorsum of his wrist and hand, however he has normal radial pulse, normal temperature to his hand, and compartments are soft compressible throughout, not consistent with compartment syndrome.  X-rays obtained in triage are negative for any acute osseous injury.  Given the continued pain and swelling, recommended splint for protection and follow-up with orthopedics.  Patient is in agreement with this plan.  He was treated symptomatically with Tylenol as well.  Additionally, patient was noted to be quite hypertensive in triage.  He has no headache, vision changes, chest pain, shortness of breath, or any other symptoms besides his right wrist pain.  Therefore this is asymptomatic hypertension.  His blood pressure medications were refilled for him, and he confirmed the names and doses of the medications.  He was instructed to follow-up with his primary care provider for further management.  He was instructed to follow-up with orthopedics regarding his wrist pain in the next few days.  In the meantime, he understand strict return precautions which were discussed at length.  Patient was discharged in stable condition.   Patient's presentation is most consistent with acute complicated illness / injury requiring diagnostic workup.      FINAL CLINICAL IMPRESSION(S) / ED DIAGNOSES   Final diagnoses:  Right wrist pain  Hypertension, unspecified type     Rx / DC Orders   ED Discharge Orders          Ordered    amLODipine (NORVASC) 5 MG tablet  Daily        09/29/22 1830     lisinopril (ZESTRIL) 10 MG tablet  Daily        09/29/22 1830             Note:  This document was prepared using Dragon voice recognition software and may include unintentional dictation errors.   Marquette Old, PA-C 09/29/22  1858    Vanessa [place], MD 09/29/22 1954

## 2022-09-29 NOTE — ED Triage Notes (Signed)
Pt presents to ED via [facility] with /co of having fall and catching his self with his R wrist. Pt states incident happened 3 weeks ago. Pt states swelling has not gone done. Possible deformity to R wrist. Pt able to move all fingers.

## 2022-09-29 NOTE — ED Provider Triage Note (Signed)
Emergency Medicine Provider Triage Evaluation Note  Lew Prout , a 46 y.o. male  was evaluated in triage.  Pt complains of right hand and wrist pain.  He states 2 weeks ago he fell out of the back of a truck.  Denies any other injury to his body.  Patient's pain is moderate.  He has had some swelling.  No fevers.  Review of Systems  Positive: Swelling pain right hand and wrist Negative: Head injury, headache, nausea, vomiting denies any other pain throughout his body  Physical Exam  BP (!) 201/136 (BP Location: Left Arm)   Pulse 80   Temp 98.2 F (36.8 C) (Oral)   Resp 18   SpO2 99%  Gen:   Awake, no distress   Resp:  Normal effort  MSK:   Moves extremities without difficulty.  Swelling along the dorsum of the right wrist and hand.  No tenderness throughout the digits.  No deformity.  Tender along the distal radial metaphysis and base of third metacarpal.   Medical Decision Making  Medically screening exam initiated at 3:21 PM.  Appropriate orders placed.  Mariea Clonts was informed that the remainder of the evaluation will be completed by another provider, this initial triage assessment does not replace that evaluation, and the importance of remaining in the ED until their evaluation is complete.  Order x-ray of the right wrist and hand.   Duanne Guess, PA-C 09/29/22 1523

## 2022-09-29 NOTE — Discharge Instructions (Addendum)
Your x-rays did not show any broken bones.  You are placed in a splint for comfort.  Please keep this clean and dry.  Please follow-up with orthopedics for recheck of your symptoms and your wrist.  You were also noted to have very elevated blood pressure.  Your blood pressure medications were refilled for you.  Take your amlodipine and lisinopril as prescribed (1 tablet each daily).  Please return the emergency department for any new, worsening, or change in symptoms or other concerns including worsening pain or swelling, fevers or chills, or any other concerns.  It was a pleasure caring for you today.

## 2022-09-29 NOTE — ED Notes (Signed)
Patient is trying to contact friends to pick him up, but has been unsuccessful.

## 2022-09-29 NOTE — ED Triage Notes (Signed)
First nurse note: Patient arrived by EMS from home c/o fall X3 weeks ago. [place] out back of truck onto right arm. Swelling in right arm started 1 week ago. Sensation abnormal for patient and redness.   EMS Vitals: 160/140 manually 115HR 99% RA 20RR 10/10 pain  Denies CP, blurred vision

## 2022-12-02 ENCOUNTER — Other Ambulatory Visit: Payer: Self-pay

## 2022-12-02 ENCOUNTER — Emergency Department: Payer: Medicaid Other

## 2022-12-02 ENCOUNTER — Observation Stay
Admission: EM | Admit: 2022-12-02 | Discharge: 2022-12-03 | Discharge status: Against Medical Advice | Payer: Medicaid Other | Attending: Hospitalist | Admitting: Hospitalist

## 2022-12-02 DIAGNOSIS — R42 Dizziness and giddiness: Secondary | ICD-10-CM | POA: Diagnosis present

## 2022-12-02 DIAGNOSIS — R55 Syncope and collapse: Secondary | ICD-10-CM | POA: Diagnosis present

## 2022-12-02 DIAGNOSIS — R6510 Systemic inflammatory response syndrome (SIRS) of non-infectious origin without acute organ dysfunction: Secondary | ICD-10-CM

## 2022-12-02 DIAGNOSIS — F1090 Alcohol use, unspecified, uncomplicated: Secondary | ICD-10-CM | POA: Insufficient documentation

## 2022-12-02 DIAGNOSIS — F4323 Adjustment disorder with mixed anxiety and depressed mood: Secondary | ICD-10-CM | POA: Diagnosis present

## 2022-12-02 DIAGNOSIS — U071 COVID-19: Secondary | ICD-10-CM | POA: Diagnosis not present

## 2022-12-02 DIAGNOSIS — R739 Hyperglycemia, unspecified: Secondary | ICD-10-CM | POA: Insufficient documentation

## 2022-12-02 DIAGNOSIS — M419 Scoliosis, unspecified: Secondary | ICD-10-CM | POA: Diagnosis present

## 2022-12-02 DIAGNOSIS — D62 Acute posthemorrhagic anemia: Secondary | ICD-10-CM | POA: Diagnosis not present

## 2022-12-02 DIAGNOSIS — T68XXXA Hypothermia, initial encounter: Secondary | ICD-10-CM | POA: Insufficient documentation

## 2022-12-02 DIAGNOSIS — R531 Weakness: Secondary | ICD-10-CM | POA: Insufficient documentation

## 2022-12-02 DIAGNOSIS — R1110 Vomiting, unspecified: Secondary | ICD-10-CM | POA: Diagnosis not present

## 2022-12-02 DIAGNOSIS — K921 Melena: Principal | ICD-10-CM

## 2022-12-02 DIAGNOSIS — F1010 Alcohol abuse, uncomplicated: Secondary | ICD-10-CM | POA: Diagnosis not present

## 2022-12-02 DIAGNOSIS — R0682 Tachypnea, not elsewhere classified: Secondary | ICD-10-CM | POA: Diagnosis present

## 2022-12-02 DIAGNOSIS — I959 Hypotension, unspecified: Secondary | ICD-10-CM

## 2022-12-02 DIAGNOSIS — R7989 Other specified abnormal findings of blood chemistry: Secondary | ICD-10-CM

## 2022-12-02 DIAGNOSIS — K625 Hemorrhage of anus and rectum: Secondary | ICD-10-CM | POA: Diagnosis not present

## 2022-12-02 DIAGNOSIS — R4182 Altered mental status, unspecified: Secondary | ICD-10-CM | POA: Diagnosis not present

## 2022-12-02 DIAGNOSIS — I10 Essential (primary) hypertension: Secondary | ICD-10-CM | POA: Diagnosis not present

## 2022-12-02 DIAGNOSIS — F1290 Cannabis use, unspecified, uncomplicated: Secondary | ICD-10-CM | POA: Diagnosis present

## 2022-12-02 DIAGNOSIS — N179 Acute kidney failure, unspecified: Secondary | ICD-10-CM | POA: Diagnosis not present

## 2022-12-02 DIAGNOSIS — S61412A Laceration without foreign body of left hand, initial encounter: Secondary | ICD-10-CM

## 2022-12-02 DIAGNOSIS — K922 Gastrointestinal hemorrhage, unspecified: Secondary | ICD-10-CM

## 2022-12-02 DIAGNOSIS — Z79899 Other long term (current) drug therapy: Secondary | ICD-10-CM | POA: Diagnosis not present

## 2022-12-02 DIAGNOSIS — F17210 Nicotine dependence, cigarettes, uncomplicated: Secondary | ICD-10-CM | POA: Insufficient documentation

## 2022-12-02 DIAGNOSIS — W19XXXA Unspecified fall, initial encounter: Secondary | ICD-10-CM | POA: Insufficient documentation

## 2022-12-02 DIAGNOSIS — R000 Tachycardia, unspecified: Secondary | ICD-10-CM | POA: Diagnosis present

## 2022-12-02 DIAGNOSIS — E861 Hypovolemia: Secondary | ICD-10-CM | POA: Diagnosis present

## 2022-12-02 LAB — BASIC METABOLIC PANEL
Anion gap: 11 (ref 5–15)
BUN: 38 mg/dL — ABNORMAL HIGH (ref 6–20)
CO2: 21 mmol/L — ABNORMAL LOW (ref 22–32)
Calcium: 8.6 mg/dL — ABNORMAL LOW (ref 8.9–10.3)
Chloride: 107 mmol/L (ref 98–111)
Creatinine, Ser: 1.51 mg/dL — ABNORMAL HIGH (ref 0.61–1.24)
GFR, Estimated: 57 mL/min — ABNORMAL LOW (ref >60)
Glucose, Bld: 173 mg/dL — ABNORMAL HIGH (ref 70–99)
Potassium: 4.2 mmol/L (ref 3.5–5.1)
Sodium: 139 mmol/L (ref 135–145)

## 2022-12-02 LAB — CBC WITH DIFFERENTIAL/PLATELET
Abs Immature Granulocytes: 0.03 10*3/uL (ref 0.00–0.07)
Basophils Absolute: 0 10*3/uL (ref 0.0–0.1)
Basophils Relative: 0 %
Eosinophils Absolute: 0.1 10*3/uL (ref 0.0–0.5)
Eosinophils Relative: 1 %
HCT: 37.3 % — ABNORMAL LOW (ref 39.0–52.0)
Hemoglobin: 11.8 g/dL — ABNORMAL LOW (ref 13.0–17.0)
Immature Granulocytes: 0 %
Lymphocytes Relative: 21 %
Lymphs Abs: 1.7 10*3/uL (ref 0.7–4.0)
MCH: 28.1 pg (ref 26.0–34.0)
MCHC: 31.6 g/dL (ref 30.0–36.0)
MCV: 88.8 fL (ref 80.0–100.0)
Monocytes Absolute: 0.4 10*3/uL (ref 0.1–1.0)
Monocytes Relative: 5 %
Neutro Abs: 5.9 10*3/uL (ref 1.7–7.7)
Neutrophils Relative %: 73 %
Platelets: 262 10*3/uL (ref 150–400)
RBC: 4.2 MIL/uL — ABNORMAL LOW (ref 4.22–5.81)
RDW: 13.4 % (ref 11.5–15.5)
WBC: 8.1 10*3/uL (ref 4.0–10.5)
nRBC: 0 % (ref 0.0–0.2)

## 2022-12-02 LAB — HEPATIC FUNCTION PANEL
ALT: 76 U/L — ABNORMAL HIGH (ref 0–44)
AST: 61 U/L — ABNORMAL HIGH (ref 15–41)
Albumin: 3.9 g/dL (ref 3.5–5.0)
Alkaline Phosphatase: 57 U/L (ref 38–126)
Bilirubin, Direct: 0.1 mg/dL (ref 0.0–0.2)
Indirect Bilirubin: 0.8 mg/dL (ref 0.3–0.9)
Total Bilirubin: 0.9 mg/dL (ref 0.3–1.2)
Total Protein: 7.1 g/dL (ref 6.5–8.1)

## 2022-12-02 LAB — ETHANOL: Alcohol, Ethyl (B): <10 mg/dL (ref <10)

## 2022-12-02 LAB — TROPONIN I (HIGH SENSITIVITY): Troponin I (High Sensitivity): 31 ng/L — ABNORMAL HIGH (ref <18)

## 2022-12-02 LAB — LACTIC ACID, PLASMA: Lactic Acid, Venous: 6.2 mmol/L (ref 0.5–1.9)

## 2022-12-02 LAB — PROTIME-INR
INR: 1.1 (ref 0.8–1.2)
Prothrombin Time: 14.5 seconds (ref 11.4–15.2)

## 2022-12-02 LAB — TYPE AND SCREEN

## 2022-12-02 LAB — CBG MONITORING, ED: Glucose-Capillary: 91 mg/dL (ref 70–99)

## 2022-12-02 MED ORDER — SODIUM CHLORIDE 0.9 % IV BOLUS
1000.0000 mL | Freq: Once | INTRAVENOUS | Status: AC
  Administered 2022-12-02: 1000 mL via INTRAVENOUS

## 2022-12-02 MED ORDER — PANTOPRAZOLE 80MG IVPB - SIMPLE MED
80.0000 mg | Freq: Once | INTRAVENOUS | Status: AC
  Administered 2022-12-02: 80 mg via INTRAVENOUS
  Filled 2022-12-02: qty 100

## 2022-12-02 MED ORDER — PANTOPRAZOLE INFUSION (NEW) - SIMPLE MED
8.0000 mg/h | INTRAVENOUS | Status: DC
  Administered 2022-12-02 – 2022-12-03 (×2): 8 mg/h via INTRAVENOUS
  Filled 2022-12-02 (×3): qty 100

## 2022-12-02 MED ORDER — LIDOCAINE HCL (PF) 1 % IJ SOLN
10.0000 mL | Freq: Once | INTRAMUSCULAR | Status: AC
  Administered 2022-12-03: 10 mL
  Filled 2022-12-02: qty 10

## 2022-12-02 MED ORDER — LACTATED RINGERS IV BOLUS
1000.0000 mL | Freq: Once | INTRAVENOUS | Status: AC
  Administered 2022-12-03: 1000 mL via INTRAVENOUS

## 2022-12-02 NOTE — ED Notes (Signed)
First Nurse Note: Pt arrives via EMS from home with complaints of laceration to the left hand ~2 inches in length. Pt states he got dizzy and fell into his sink and broke the sink causing the laceration. Per EMS, the had some "coffee ground" emesis on the bathroom floor. Denies LOC or hitting his head.     VS with EMS 201/166 - hasn't taken his meds for several days but took a dose today 98% RA 76 HR

## 2022-12-02 NOTE — ED Notes (Signed)
Pt having black, tarry stool

## 2022-12-02 NOTE — ED Triage Notes (Addendum)
Pt from home via [facility]. Pt states he felt dizzy and fell, hitting hand on sink and lacerating left hand. Pt denies hitting head or LOC. Pt very diaphoretic and cold

## 2022-12-02 NOTE — ED Provider Notes (Signed)
[facility] Provider Note    Event Date/Time   First MD Initiated Contact with Patient 12/02/22 2147     (approximate)   History   Laceration and Rectal Bleeding   HPI  Fred [name] is a 46 y.o. male with history of alcohol abuse, hypertension, and adjustment disorder who presents with a left hand injury, generalized weakness, and dark stools.  The patient presented by EMS stating he got dizzy while in his bathroom, fell into his sink, and broke it causing a laceration to the left hand.  In the ED the patient was noted to be weak and somewhat altered.  He then started shaking and extended his chest outwards.  This concerned the triage staff for a seizure.  By the time I saw the patient in triage he was weak appearing but arousable and was not demonstrating any convulsions or seizure activity.  The patient states that he threw up something that looked dark red but believes it was due to him drinking a red drink.  He states he feels weak and cold.  He denies any abdominal pain, chest pain, fever or chills, or any active nausea or vomiting.  The patient had a bowel movement in triage.  I reviewed the past medical records.  The patient was most recently seen in the ED on 10/19 for wrist pain.  He has no recent hospital admissions.  I do not see any prior records of endoscopy or colonoscopy or any prior history of GI bleed.   Physical Exam   Triage Vital Signs: ED Triage Vitals [12/02/22 2158]  Enc Vitals Group     BP      Pulse      Resp      Temp (!) 92.5 F (33.6 C)     Temp Source Rectal     SpO2      Weight      Height      Head Circumference      Peak Flow      Pain Score      Pain Loc      Pain Edu?      Excl. in [place]?     Most recent vital signs: Vitals:   12/02/22 2158  Temp: (!) 92.5 F (33.6 C)     General: Weak appearing but fully arousable and oriented x 3. CV:  Good peripheral perfusion.  Normal heart  sounds. Resp:  Normal effort.  Lungs CTAB. Abd:  Soft and nontender.  No distention.  Other:  2 cm laceration to medial volar aspect of left palm.  No exposed tendon or bone.  EOMI.  PERRLA.  Motor intact in all extremities.   ED Results / Procedures / Treatments   Labs (all labs ordered are listed, but only abnormal results are displayed) Labs Reviewed  BASIC METABOLIC PANEL - Abnormal; Notable for the following components:      Result Value   CO2 21 (*)    Glucose, Bld 173 (*)    BUN 38 (*)    Creatinine, Ser 1.51 (*)    Calcium 8.6 (*)    GFR, Estimated 57 (*)    All other components within normal limits  HEPATIC FUNCTION PANEL - Abnormal; Notable for the following components:   AST 61 (*)    ALT 76 (*)    All other components within normal limits  CBC WITH DIFFERENTIAL/PLATELET - Abnormal; Notable for the following components:   RBC 4.20 (*)  Hemoglobin 11.8 (*)    HCT 37.3 (*)    All other components within normal limits  TROPONIN I (HIGH SENSITIVITY) - Abnormal; Notable for the following components:   Troponin I (High Sensitivity) 31 (*)    All other components within normal limits  PROTIME-INR  ETHANOL  LACTIC ACID, PLASMA  LACTIC ACID, PLASMA  URINE DRUG SCREEN, QUALITATIVE ([facility] ONLY)  URINALYSIS, ROUTINE W REFLEX MICROSCOPIC  CBG MONITORING, ED  TYPE AND SCREEN  TYPE AND SCREEN  TYPE AND SCREEN  TROPONIN I (HIGH SENSITIVITY)     EKG  ED ECG REPORT I, Dionne Bucy, the attending physician, personally viewed and interpreted this ECG.  Date: 12/02/2022 EKG Time: 2140 Rate: 94 Rhythm: normal sinus rhythm QRS Axis: normal Intervals: normal ST/T Wave abnormalities: LH with repolarization abnormality, inferior and lateral T wave inversions Narrative Interpretation: Nonspecific abnormalities with no evidence of acute ischemia    RADIOLOGY  XR L hand: Pending  PROCEDURES:  Critical Care performed: Yes, see critical care procedure  note(s)  .Critical Care  Performed by: Dionne Bucy, MD Authorized by: Dionne Bucy, MD   Critical care provider statement:    Critical care time (minutes):  45   Critical care time was exclusive of:  Separately billable procedures and treating other patients   Critical care was necessary to treat or prevent imminent or life-threatening deterioration of the following conditions:  Circulatory failure and shock   Critical care was time spent personally by me on the following activities:  Development of treatment plan with patient or surrogate, discussions with consultants, evaluation of patient's response to treatment, examination of patient, ordering and review of laboratory studies, ordering and review of radiographic studies, ordering and performing treatments and interventions, pulse oximetry, re-evaluation of patient's condition, review of old charts and obtaining history from patient or surrogate    MEDICATIONS ORDERED IN ED: Medications  pantoprozole (PROTONIX) 80 mg /NS 100 mL infusion (8 mg/hr Intravenous New Bag/Given 12/02/22 2259)  lidocaine (PF) (XYLOCAINE) 1 % injection 10 mL (has no administration in time range)  lactated ringers bolus 1,000 mL (has no administration in time range)  sodium chloride 0.9 % bolus 1,000 mL (1,000 mLs Intravenous New Bag/Given 12/02/22 2250)  pantoprazole (PROTONIX) 80 mg /NS 100 mL IVPB (0 mg Intravenous Stopped 12/02/22 2320)     IMPRESSION / MDM / ASSESSMENT AND PLAN / ED COURSE  I reviewed the triage vital signs and the nursing notes.  46 year old male with PMH as noted above presents with an near syncope causing him to fall and cut his left hand as well as an episode of altered mental status and an apparent GI bleed.  The patient presented due to the near syncope and hand laceration but while in triage she became altered and had an episode of shaking and possible convulsions.  When I came to triage to examine the patient, he was  hypotensive with otherwise normal vital signs.  He appeared somnolent but was arousable and oriented.  He had a few episodes where he stretched out, arched his back, and had what appeared to be rigors but was fully awake during these episodes.  They were not consistent with a seizure.  The patient had a BM while in triage and further examination revealed a moderate amount of melena.  He was found to be hypothermic.  Differential diagnosis includes, but is not limited to, acute GI bleed (likely upper GI, possible gastritis or PUD, less likely esophageal), dehydration/hypovolemia, electrolyte abnormality, other metabolic disturbance,  acute infection/sepsis.  The patient was immediately brought back to a room.  IV access was established and he was placed on the monitor.  I ordered IV Protonix drip.  At this time I do not suspect esophageal varices as the patient has no prior history of liver disease, so there is no indication for octreotide at this time.  I ordered an IV fluid bolus and lab workup.  The patient was placed on the bear hugger due to the hypothermia.  We will obtain an x-ray of the left hand to evaluate for foreign body and then plan for laceration repair.  Patient's presentation is most consistent with acute presentation with potential threat to life or bodily function.  The patient is on the cardiac monitor to evaluate for evidence of arrhythmia and/or significant heart rate changes.  ----------------------------------------- 11:30 PM on 12/02/2022 -----------------------------------------  Fluids are running.  The patient is still borderline hypotensive although appears much more comfortable and remains alert and oriented.  Lab workup reveals a hemoglobin of 11.8, minimally elevated LFTs abnormal bilirubin, slightly elevated creatinine and troponin.  The patient will need admission once we see how he responds to fluids.  He will also need laceration repair.  I signed him out to the  oncoming ED physician Dr. Tamala Julian.   FINAL CLINICAL IMPRESSION(S) / ED DIAGNOSES   Final diagnoses:  Gastrointestinal hemorrhage, unspecified gastrointestinal hemorrhage type     Rx / DC Orders   ED Discharge Orders     None        Note:  This document was prepared using Dragon voice recognition software and may include unintentional dictation errors.    Arta Silence, MD 12/02/22 [id number]

## 2022-12-02 NOTE — ED Notes (Signed)
Bearhugger placed on pt, multiple warm blankets applied to pt

## 2022-12-02 NOTE — ED Notes (Signed)
Pt started seizing in triage, lasted about 1-2 mins

## 2022-12-02 NOTE — ED Notes (Signed)
Dr. Marisa Severin in triage to see patient - pt placed on stretcher - EKG & VS obtained - taken to room 10.

## 2022-12-03 ENCOUNTER — Emergency Department: Payer: Medicaid Other

## 2022-12-03 DIAGNOSIS — N179 Acute kidney failure, unspecified: Secondary | ICD-10-CM | POA: Insufficient documentation

## 2022-12-03 DIAGNOSIS — R6510 Systemic inflammatory response syndrome (SIRS) of non-infectious origin without acute organ dysfunction: Secondary | ICD-10-CM

## 2022-12-03 DIAGNOSIS — Z79899 Other long term (current) drug therapy: Secondary | ICD-10-CM | POA: Diagnosis not present

## 2022-12-03 DIAGNOSIS — M419 Scoliosis, unspecified: Secondary | ICD-10-CM | POA: Diagnosis present

## 2022-12-03 DIAGNOSIS — U071 COVID-19: Secondary | ICD-10-CM

## 2022-12-03 DIAGNOSIS — D62 Acute posthemorrhagic anemia: Secondary | ICD-10-CM

## 2022-12-03 DIAGNOSIS — S61412A Laceration without foreign body of left hand, initial encounter: Secondary | ICD-10-CM

## 2022-12-03 DIAGNOSIS — R42 Dizziness and giddiness: Secondary | ICD-10-CM | POA: Diagnosis present

## 2022-12-03 DIAGNOSIS — F1010 Alcohol abuse, uncomplicated: Secondary | ICD-10-CM | POA: Diagnosis present

## 2022-12-03 DIAGNOSIS — R0682 Tachypnea, not elsewhere classified: Secondary | ICD-10-CM | POA: Diagnosis present

## 2022-12-03 DIAGNOSIS — I959 Hypotension, unspecified: Secondary | ICD-10-CM

## 2022-12-03 DIAGNOSIS — R000 Tachycardia, unspecified: Secondary | ICD-10-CM | POA: Diagnosis present

## 2022-12-03 DIAGNOSIS — R739 Hyperglycemia, unspecified: Secondary | ICD-10-CM | POA: Diagnosis present

## 2022-12-03 DIAGNOSIS — K921 Melena: Secondary | ICD-10-CM | POA: Diagnosis not present

## 2022-12-03 DIAGNOSIS — T68XXXA Hypothermia, initial encounter: Secondary | ICD-10-CM | POA: Diagnosis present

## 2022-12-03 DIAGNOSIS — I10 Essential (primary) hypertension: Secondary | ICD-10-CM | POA: Diagnosis present

## 2022-12-03 DIAGNOSIS — F1290 Cannabis use, unspecified, uncomplicated: Secondary | ICD-10-CM | POA: Diagnosis present

## 2022-12-03 DIAGNOSIS — W19XXXA Unspecified fall, initial encounter: Secondary | ICD-10-CM | POA: Diagnosis present

## 2022-12-03 DIAGNOSIS — R55 Syncope and collapse: Secondary | ICD-10-CM | POA: Diagnosis not present

## 2022-12-03 DIAGNOSIS — R7989 Other specified abnormal findings of blood chemistry: Secondary | ICD-10-CM

## 2022-12-03 DIAGNOSIS — F4323 Adjustment disorder with mixed anxiety and depressed mood: Secondary | ICD-10-CM | POA: Diagnosis present

## 2022-12-03 DIAGNOSIS — F1090 Alcohol use, unspecified, uncomplicated: Secondary | ICD-10-CM | POA: Insufficient documentation

## 2022-12-03 DIAGNOSIS — E861 Hypovolemia: Secondary | ICD-10-CM | POA: Diagnosis present

## 2022-12-03 DIAGNOSIS — F17210 Nicotine dependence, cigarettes, uncomplicated: Secondary | ICD-10-CM | POA: Diagnosis present

## 2022-12-03 LAB — URINALYSIS, ROUTINE W REFLEX MICROSCOPIC
Bilirubin Urine: NEGATIVE
Glucose, UA: NEGATIVE mg/dL
Hgb urine dipstick: NEGATIVE
Ketones, ur: NEGATIVE mg/dL
Leukocytes,Ua: NEGATIVE
Nitrite: NEGATIVE
Protein, ur: NEGATIVE mg/dL
Specific Gravity, Urine: 1.016 (ref 1.005–1.030)
pH: 5 (ref 5.0–8.0)

## 2022-12-03 LAB — URINE DRUG SCREEN, QUALITATIVE (ARMC ONLY)
Amphetamines, Ur Screen: NOT DETECTED
Barbiturates, Ur Screen: NOT DETECTED
Benzodiazepine, Ur Scrn: NOT DETECTED
Cannabinoid 50 Ng, Ur ~~LOC~~: NOT DETECTED
Cocaine Metabolite,Ur ~~LOC~~: NOT DETECTED
MDMA (Ecstasy)Ur Screen: NOT DETECTED
Methadone Scn, Ur: NOT DETECTED
Opiate, Ur Screen: NOT DETECTED
Phencyclidine (PCP) Ur S: NOT DETECTED
Tricyclic, Ur Screen: NOT DETECTED

## 2022-12-03 LAB — LACTIC ACID, PLASMA
Lactic Acid, Venous: 6.3 mmol/L (ref 0.5–1.9)
Lactic Acid, Venous: 1.7 mmol/L (ref 0.5–1.9)

## 2022-12-03 LAB — TROPONIN I (HIGH SENSITIVITY): Troponin I (High Sensitivity): 30 ng/L — ABNORMAL HIGH (ref <18)

## 2022-12-03 LAB — RESP PANEL BY RT-PCR (RSV, FLU A&B, COVID)  RVPGX2
Influenza A by PCR: NEGATIVE
Influenza B by PCR: NEGATIVE
Resp Syncytial Virus by PCR: NEGATIVE
SARS Coronavirus 2 by RT PCR: POSITIVE — AB

## 2022-12-03 LAB — HEMOGLOBIN: Hemoglobin: 9.6 g/dL — ABNORMAL LOW (ref 13.0–17.0)

## 2022-12-03 LAB — ABO/RH: ABO/RH(D): O POS

## 2022-12-03 LAB — HIV ANTIBODY (ROUTINE TESTING W REFLEX): HIV Screen 4th Generation wRfx: NONREACTIVE

## 2022-12-03 MED ORDER — LORAZEPAM 2 MG/ML IJ SOLN: 1.0000 mg | Freq: Once | INTRAMUSCULAR | Status: DC

## 2022-12-03 MED ORDER — ONDANSETRON HCL 4 MG/2ML IJ SOLN: 4.0000 mg | Freq: Four times a day (QID) | INTRAMUSCULAR | Status: DC | PRN

## 2022-12-03 MED ORDER — ADULT MULTIVITAMIN W/MINERALS CH
1.0000 | ORAL_TABLET | Freq: Every day | ORAL | Status: DC
  Administered 2022-12-03: 1 via ORAL
  Filled 2022-12-03: qty 1

## 2022-12-03 MED ORDER — LORAZEPAM 1 MG PO TABS: 1.0000 mg | ORAL_TABLET | ORAL | Status: DC | PRN

## 2022-12-03 MED ORDER — THIAMINE MONONITRATE 100 MG PO TABS
100.0000 mg | ORAL_TABLET | Freq: Every day | ORAL | Status: DC
  Administered 2022-12-03: 100 mg via ORAL
  Filled 2022-12-03: qty 1

## 2022-12-03 MED ORDER — FOLIC ACID 1 MG PO TABS
1.0000 mg | ORAL_TABLET | Freq: Every day | ORAL | Status: DC
  Administered 2022-12-03: 1 mg via ORAL
  Filled 2022-12-03: qty 1

## 2022-12-03 MED ORDER — LACTATED RINGERS IV SOLN: INTRAVENOUS | Status: DC

## 2022-12-03 MED ORDER — SODIUM CHLORIDE 0.9 % IV SOLN
1.0000 g | Freq: Once | INTRAVENOUS | Status: AC
  Administered 2022-12-03: 1 g via INTRAVENOUS
  Filled 2022-12-03: qty 10

## 2022-12-03 MED ORDER — ONDANSETRON HCL 4 MG PO TABS: 4.0000 mg | ORAL_TABLET | Freq: Four times a day (QID) | ORAL | Status: DC | PRN

## 2022-12-03 MED ORDER — ACETAMINOPHEN 325 MG PO TABS: 650.0000 mg | ORAL_TABLET | Freq: Four times a day (QID) | ORAL | Status: DC | PRN

## 2022-12-03 MED ORDER — THIAMINE HCL 100 MG/ML IJ SOLN: 100.0000 mg | Freq: Every day | INTRAMUSCULAR | Status: DC

## 2022-12-03 MED ORDER — ACETAMINOPHEN 325 MG RE SUPP: 650.0000 mg | Freq: Four times a day (QID) | RECTAL | Status: DC | PRN

## 2022-12-03 MED ORDER — LORAZEPAM 2 MG/ML IJ SOLN: 1.0000 mg | INTRAMUSCULAR | Status: DC | PRN

## 2022-12-03 MED ORDER — MORPHINE SULFATE (PF) 2 MG/ML IV SOLN: 2.0000 mg | INTRAVENOUS | Status: DC | PRN

## 2022-12-03 MED ORDER — SODIUM CHLORIDE 0.9% IV SOLUTION: Freq: Once | INTRAVENOUS | Status: DC

## 2022-12-03 NOTE — H&P (Addendum)
History and Physical    Patient: Fred [name] QJF:[id number] DOB: 04/24/1976 DOA: 12/02/2022 DOS: the patient was seen and examined on 12/03/2022 PCP: Pcp, No  Patient coming from: Home  Chief Complaint:  Chief Complaint  Patient presents with   Laceration   Rectal Bleeding    HPI: Fred [name] is a 46 y.o. male with medical history significant for Alcohol use disorder, adjustment disorder and hypertension who presents to the ED for evaluation of rectal bleeding, syncopal episode and a laceration of the left hand.  Patient has been having dark stools and he was on the commode earlier and on getting up he felt dizzy and fell backwards onto the sink.  His hand came down on the sink breaking it and sustaining a laceration to the left hand.  Patient states that for the past few days he has been feeling weak and cold and lightheaded.  Denies abdominal pain, cough, chest pain, fever or chills.  States he drank heavily until 2 years ago but now drinks 1 beer every couple of days.   ED course and data review.  On arrival to the ED he was hypothermic at 92.5 which improved with a Lawyer which was subsequently removed.  He was tachycardic to 103 on arrival, tachypneic to 23 and hypotensive at 85/62.  He was fluid responsive to 120/82.  O2 sat was in the high 90s on room air. Hemoglobin 11.8, baseline 16-17 though this is not recent, creatinine 1.51, most recent baseline from 2 years prior was 1.10.  Glucose 173.  AST 61 and ALT 76.  UA unremarkable, UDS clean.  COVID-positive troponin 30, EtOH less than 10. EKG, personally viewed and interpreted showing NSR at 95 with T wave inversions inferior and lateral leads Chest x-ray nonacute.  X-ray left hand showing questionable foreign body in the palmar soft tissues. Patient was treated with IV fluid boluses given a Protonix bolus and started on an infusion and also given Rocephin.  Hospitalist consulted for admission.  Laceration  repair pending at the time of admission   Review of Systems: As mentioned in the history of present illness. All other systems reviewed and are negative.  Past Medical History:  Diagnosis Date   Hypertension    Medical history non-contributory    Scoliosis    Past Surgical History:  Procedure Laterality Date   NO PAST SURGERIES     Social History:  reports that he has been smoking cigarettes. He has never used smokeless tobacco. He reports current alcohol use of about 1.0 standard drink of alcohol per week. He reports current drug use. Frequency: 1.00 time per week. Drug: Marijuana.  No Known Allergies  History reviewed. No pertinent family history.  Prior to Admission medications   Medication Sig Start Date End Date Taking? Authorizing Provider  amLODipine (NORVASC) 5 MG tablet Take 1 tablet (5 mg total) by mouth daily. 11/11/20  Yes Bridget Hartshorn L, PA-C  lisinopril (ZESTRIL) 10 MG tablet Take 1 tablet (10 mg total) by mouth daily. 11/11/20  Yes Tommi Rumps, PA-C  thiamine 100 MG tablet Take 1 tablet (100 mg total) by mouth daily. 12/18/18  Yes Clapacs, Jackquline Denmark, MD  naltrexone (DEPADE) 50 MG tablet Take 1 tablet (50 mg total) by mouth daily. Patient not taking: Reported on 12/03/2022 12/18/18   Audery Amel, MD    Physical Exam: Vitals:   12/03/22 0230 12/03/22 0256 12/03/22 0300 12/03/22 0400  BP: 108/63  (!) 110/97 120/82  Pulse:  95  97 94  Resp: 19  (!) 23 (!) 21  Temp:  100 F (37.8 C)    TempSrc:  Oral    SpO2: 98%  99% 100%   Physical Exam Vitals and nursing note reviewed.  Constitutional:      General: He is not in acute distress. HENT:     Head: Normocephalic and atraumatic.  Cardiovascular:     Rate and Rhythm: Normal rate and regular rhythm.     Heart sounds: Normal heart sounds.  Pulmonary:     Effort: Pulmonary effort is normal.     Breath sounds: Normal breath sounds.  Abdominal:     Palpations: Abdomen is soft.     Tenderness: There is no  abdominal tenderness.  Musculoskeletal:     Comments: Left hand in bandage, soiled with blood  Neurological:     Mental Status: Mental status is at baseline.     Labs on Admission: I have personally reviewed following labs and imaging studies  CBC: Recent Labs  Lab 12/02/22 2148 12/03/22 0432  WBC 8.1  --   NEUTROABS 5.9  --   HGB 11.8* 9.6*  HCT 37.3*  --   MCV 88.8  --   PLT 262  --    Basic Metabolic Panel: Recent Labs  Lab 12/02/22 2148  NA 139  K 4.2  CL 107  CO2 21*  GLUCOSE 173*  BUN 38*  CREATININE 1.51*  CALCIUM 8.6*   GFR: CrCl cannot be calculated (Unknown ideal weight.). Liver Function Tests: Recent Labs  Lab 12/02/22 2148  AST 61*  ALT 76*  ALKPHOS 57  BILITOT 0.9  PROT 7.1  ALBUMIN 3.9   No results for input(s): "LIPASE", "AMYLASE" in the last 168 hours. No results for input(s): "AMMONIA" in the last 168 hours. Coagulation Profile: Recent Labs  Lab 12/02/22 2148  INR 1.1   Cardiac Enzymes: No results for input(s): "CKTOTAL", "CKMB", "CKMBINDEX", "TROPONINI" in the last 168 hours. BNP (last 3 results) No results for input(s): "PROBNP" in the last 8760 hours. HbA1C: No results for input(s): "HGBA1C" in the last 72 hours. CBG: Recent Labs  Lab 12/02/22 2139  GLUCAP 91   Lipid Profile: No results for input(s): "CHOL", "HDL", "LDLCALC", "TRIG", "CHOLHDL", "LDLDIRECT" in the last 72 hours. Thyroid Function Tests: No results for input(s): "TSH", "T4TOTAL", "FREET4", "T3FREE", "THYROIDAB" in the last 72 hours. Anemia Panel: No results for input(s): "VITAMINB12", "FOLATE", "FERRITIN", "TIBC", "IRON", "RETICCTPCT" in the last 72 hours. Urine analysis:    Component Value Date/Time   COLORURINE YELLOW (A) 12/03/2022 0304   APPEARANCEUR CLEAR (A) 12/03/2022 0304   LABSPEC 1.016 12/03/2022 0304   PHURINE 5.0 12/03/2022 0304   GLUCOSEU NEGATIVE 12/03/2022 0304   HGBUR NEGATIVE 12/03/2022 0304   BILIRUBINUR NEGATIVE 12/03/2022 0304    KETONESUR NEGATIVE 12/03/2022 0304   PROTEINUR NEGATIVE 12/03/2022 0304   NITRITE NEGATIVE 12/03/2022 0304   LEUKOCYTESUR NEGATIVE 12/03/2022 0304    Radiological Exams on Admission: DG Hand 2 View Left  Result Date: 12/03/2022 CLINICAL DATA:  Recent fall with left hand pain and laceration, initial EXAM: LEFT HAND - 2 VIEW COMPARISON:  None Available. FINDINGS: Soft tissue irregularity is noted consistent with the given clinical history of laceration. No acute fracture or dislocation is noted. Questionable density is noted in the palmar soft tissues which may represent a tiny glass shard. No other foreign bodies are seen. IMPRESSION: No acute fracture noted. Questionable foreign body in the palmar soft tissues. Electronically Signed  By: Alcide CleverMark  Lukens M.D.   On: 12/03/2022 00:00   DG Chest Portable 1 View  Result Date: 12/02/2022 CLINICAL DATA:  Recent fall with chest pain, initial encounter EXAM: PORTABLE CHEST 1 VIEW COMPARISON:  11/01/13 FINDINGS: The heart size and mediastinal contours are within normal limits. Both lungs are clear. The visualized skeletal structures are unremarkable. IMPRESSION: No active disease. Electronically Signed   By: Alcide CleverMark  Lukens M.D.   On: 12/02/2022 23:57     Data Reviewed: Relevant notes from primary care and specialist visits, past discharge summaries as available in EHR, including Care Everywhere. Prior diagnostic testing as pertinent to current admission diagnoses Updated medications and problem lists for reconciliation ED course, including vitals, labs, imaging, treatment and response to treatment Triage notes, nursing and pharmacy notes and ED provider's notes Notable results as noted in HPI   Assessment and Plan: * Hematochezia Acute blood loss anemia Patient reports multiple dark stools and hemoglobin was 11 from baseline of 16-17 a couple years prior Serial H&H and transfuse if needed : Postadmission downward trend of hemoglobin  11.8-9.6 Continue Protonix infusion started in the ED IV hydration and keep n.p.o. Type cross and transfuse if needed GI consult  Postural dizziness with presyncope Hypotension, suspect orthostatic hypotension related to hypovolemia Patient had a presyncopal episode as he stood up from the commode Systolic blood pressure on arrival in the 80s,  Hold home antihypertensives IV hydration Continuous cardiac monitoring  Elevated troponin Abnormal EKG-inferior and lateral T wave inversion Suspect demand ischemia from acute blood loss anemia Patient denies chest pain Consider echo to evaluate for wall motion abnormality  Laceration of left hand Possible foreign body left hand on x-ray Pending suturing in the ED Update tetanus shot Wound care  SIRS (systemic inflammatory response syndrome) (HCC) Patient with tachycardia, hypotension, hypothermia suspect all related to presyncopal event Improving with IV fluids Continue to monitor Sepsis not suspected at this time  COVID-19 virus infection COVID-positive Patient denies cough or shortness of breath Airborne precautions and supportive care  Elevated blood sugar Blood sugar 173.  Will get A1c to screen for diabetes  AKI (acute kidney injury) (HCC) Creatinine 1.51, presumed acute related to hypovolemia IV hydration and monitor  Alcohol use disorder EtOH level less than 10 Patient states he only drinks a beer every other day Will still do CIWA precautions  Hypertension Holding home antihypertensives due to hypotension  Adjustment disorder with mixed anxiety and depressed mood Followed by psychiatry        DVT prophylaxis: SCD  Consults: GI, Dr. Tobi BastosAnna  Advance Care Planning:   Code Status: Prior   Family Communication: none  Disposition Plan: Back to previous home environment  Severity of Illness: The appropriate patient status for this patient is INPATIENT. Inpatient status is judged to be reasonable and  necessary in order to provide the required intensity of service to ensure the patient's safety. The patient's presenting symptoms, physical exam findings, and initial radiographic and laboratory data in the context of their chronic comorbidities is felt to place them at high risk for further clinical deterioration. Furthermore, it is not anticipated that the patient will be medically stable for discharge from the hospital within 2 midnights of admission.   * I certify that at the point of admission it is my clinical judgment that the patient will require inpatient hospital care spanning beyond 2 midnights from the point of admission due to high intensity of service, high risk for further deterioration and high frequency of  surveillance required.*  Author: Andris Baumann, MD 12/03/2022 4:16 AM  For on call review [email or web address].

## 2022-12-03 NOTE — Consult Note (Signed)
Wyline Mood , MD [address], [place], [place], [address] [address], [place], [place], [address] Phone: [phone]  Fax: [phone]  Consultation  Referring Provider:    ER Primary Care Physician:  Pcp, No Primary Gastroenterologist: None         Reason for Consultation:     Gi bleed  Date of Admission:  12/02/2022 Date of Consultation:  12/03/2022         HPI:   Fred [name] is a 46 y.o. male with a history of alcohol abuse presented emergency room with rectal bleeding and a laceration to the left hand.  He mentioned the emergency room had been having dark stools in the ER he was hypotensive.   11 days back his hemoglobin is 11.8 g and today it is 9.6 g.  He is positive for COVID.  INR on admission 1.1.  Admission elevated BUN of 38 and creatinine of 1.51 compared to a baseline of 1.1   He says he has not had any further bleeding after coming into the hospital , denies any abdominal pain , vomiting , nose bleeds, use of blood thinners.   Past Medical History:  Diagnosis Date   Hypertension    Medical history non-contributory    Scoliosis     Past Surgical History:  Procedure Laterality Date   NO PAST SURGERIES      Prior to Admission medications   Medication Sig Start Date End Date Taking? Authorizing Provider  amLODipine (NORVASC) 5 MG tablet Take 1 tablet (5 mg total) by mouth daily. 11/11/20  Yes Bridget Hartshorn L, PA-C  lisinopril (ZESTRIL) 10 MG tablet Take 1 tablet (10 mg total) by mouth daily. 11/11/20  Yes Tommi Rumps, PA-C  thiamine 100 MG tablet Take 1 tablet (100 mg total) by mouth daily. 12/18/18  Yes Clapacs, Jackquline Denmark, MD  naltrexone (DEPADE) 50 MG tablet Take 1 tablet (50 mg total) by mouth daily. Patient not taking: Reported on 12/03/2022 12/18/18   Clapacs, Jackquline Denmark, MD    History reviewed. No pertinent family history.   Social History   Tobacco Use   Smoking status: Every Day    Years: 20.00    Types:  Cigarettes   Smokeless tobacco: Never   Tobacco comments:    pt refused  Substance Use Topics   Alcohol use: Yes    Alcohol/week: 1.0 standard drink of alcohol    Types: 1 Cans of beer per week   Drug use: Yes    Frequency: 1.0 times per week    Types: Marijuana    Allergies as of 12/02/2022   (No Known Allergies)    Review of Systems:    All systems reviewed and negative except where noted in HPI.   Physical Exam:  Vital signs in last 24 hours: Temp:  [92.5 F (33.6 C)-100 F (37.8 C)] 97.8 F (36.6 C) (12/23 0800) Pulse Rate:  [94-103] 103 (12/23 0800) Resp:  [18-29] 18 (12/23 0800) BP: (85-122)/(62-97) 119/78 (12/23 0800) SpO2:  [97 %-100 %] 97 % (12/23 0800)   General:   Pleasant, cooperative in NAD, had a tissue stuck in his nose and had blood stains over his gown.  Head:  Normocephalic and atraumatic. Eyes:   No icterus.   Conjunctiva pink. PERRLA. Ears:  Normal auditory acuity. Lungs: Respirations even and unlabored. Lungs clear to auscultation bilaterally.   No wheezes, crackles, or rhonchi.  Heart:  Regular rate and rhythm;  Without  murmur, clicks, rubs or gallops Abdomen:  Soft, nondistended, nontender. Normal bowel sounds. No appreciable masses or hepatomegaly.  No rebound or guarding.  Neurologic:  Alert and oriented x3;  grossly normal neurologically. Psych:  Alert and cooperative. Normal affect.  LAB RESULTS: Recent Labs    12/02/22 2148 12/03/22 0432  WBC 8.1  --   HGB 11.8* 9.6*  HCT 37.3*  --   PLT 262  --    BMET Recent Labs    12/02/22 2148  NA 139  K 4.2  CL 107  CO2 21*  GLUCOSE 173*  BUN 38*  CREATININE 1.51*  CALCIUM 8.6*   LFT Recent Labs    12/02/22 2148  PROT 7.1  ALBUMIN 3.9  AST 61*  ALT 76*  ALKPHOS 57  BILITOT 0.9  BILIDIR 0.1  IBILI 0.8   PT/INR Recent Labs    12/02/22 2148  LABPROT 14.5  INR 1.1    STUDIES: DG Hand 2 View Left  Result Date: 12/03/2022 CLINICAL DATA:  Laceration with interval  cleaning, evaluate for foreign body EXAM: LEFT HAND - 2 VIEW COMPARISON:  Yesterday FINDINGS: Bandage overlaps the laceration at the medial hand. No imbedded and opaque foreign body is seen. Negative for fracture or subluxation. IMPRESSION: Accounting for bandage artifact, no opaque foreign body. Electronically Signed   By: Tiburcio Pea M.D.   On: 12/03/2022 04:33   DG Hand 2 View Left  Result Date: 12/03/2022 CLINICAL DATA:  Recent fall with left hand pain and laceration, initial EXAM: LEFT HAND - 2 VIEW COMPARISON:  None Available. FINDINGS: Soft tissue irregularity is noted consistent with the given clinical history of laceration. No acute fracture or dislocation is noted. Questionable density is noted in the palmar soft tissues which may represent a tiny glass shard. No other foreign bodies are seen. IMPRESSION: No acute fracture noted. Questionable foreign body in the palmar soft tissues. Electronically Signed   By: Alcide Clever M.D.   On: 12/03/2022 00:00   DG Chest Portable 1 View  Result Date: 12/02/2022 CLINICAL DATA:  Recent fall with chest pain, initial encounter EXAM: PORTABLE CHEST 1 VIEW COMPARISON:  11/01/13 FINDINGS: The heart size and mediastinal contours are within normal limits. Both lungs are clear. The visualized skeletal structures are unremarkable. IMPRESSION: No active disease. Electronically Signed   By: Alcide Clever M.D.   On: 12/02/2022 23:57      Impression / Plan:   Fred [name] is a 46 y.o. y/o male with a history of alcohol abuse comes to the emergency room with a history of laceration on his hand as well as possible melena/hematochezia drop in hemoglobin of over 2 g from baseline.  Plan 1.  Monitor CBC transfuse as needed 2.  IV PPI 3.  I recommended him to have an EGD tomorrow , he refused explained that we cannot find out the cause of his black stools unless we look inside his stomach , he said he wants to go home and then decide to return if he  bleeds further.     I will sign off.  Please call me if any further GI concerns or questions.  We would like to thank you for the opportunity to participate in the care of Fred [name].   Thank you for involving me in the care of this patient.      LOS: 0 days   Wyline Mood, MD  12/03/2022, 12:25 PM

## 2022-12-03 NOTE — Assessment & Plan Note (Signed)
Abnormal EKG-inferior and lateral T wave inversion Suspect demand ischemia from acute blood loss anemia Patient denies chest pain Consider echo to evaluate for wall motion abnormality

## 2022-12-03 NOTE — Assessment & Plan Note (Signed)
COVID-positive Patient denies cough or shortness of breath Airborne precautions and supportive care

## 2022-12-03 NOTE — Assessment & Plan Note (Signed)
Hypotension, suspect orthostatic hypotension related to hypovolemia Patient had a presyncopal episode as he stood up from the commode Systolic blood pressure on arrival in the 80s,  Hold home antihypertensives IV hydration Continuous cardiac monitoring

## 2022-12-03 NOTE — Assessment & Plan Note (Signed)
Blood sugar 173.  Will get A1c to screen for diabetes

## 2022-12-03 NOTE — ED Notes (Signed)
Bearhugger removed from pt, pt has oral temp of 99.0, Dr. Katrinka Blazing notified

## 2022-12-03 NOTE — Assessment & Plan Note (Signed)
Possible foreign body left hand on x-ray Pending suturing in the ED Update tetanus shot Wound care

## 2022-12-03 NOTE — ED Notes (Signed)
Pt is very aggressive towards staff. Yelling, raising his voice, very demanding. Pt keeps complaining "you keep leaving me alone and forgetting about me". This RN reinforced that I have not forgot about him and that I do have other patients,

## 2022-12-03 NOTE — IPAL (Signed)
  Interdisciplinary Goals of Care Family Meeting   Date carried out: 12/03/2022  Location of the meeting: Bedside  Member's involved: Physician  Durable Power of Attorney or acting medical decision maker: patient    Discussion: We discussed goals of care for [employer] .    Code status:   Code Status: Full Code   Disposition: Continue current acute care  Time spent for the meeting: 5    Andris Baumann, MD  12/03/2022, 5:08 AM

## 2022-12-03 NOTE — Assessment & Plan Note (Signed)
Followed by psychiatry 

## 2022-12-03 NOTE — Assessment & Plan Note (Signed)
   Holding home antihypertensives due to hypotension 

## 2022-12-03 NOTE — Assessment & Plan Note (Signed)
Patient with tachycardia, hypotension, hypothermia suspect all related to presyncopal event Improving with IV fluids Continue to monitor Sepsis not suspected at this time

## 2022-12-03 NOTE — Assessment & Plan Note (Addendum)
Creatinine 1.51, presumed acute related to hypovolemia IV hydration and monitor

## 2022-12-03 NOTE — ED Notes (Signed)
Pt signed AMA forms. Dr Fran Lowes notified.

## 2022-12-03 NOTE — Assessment & Plan Note (Addendum)
Acute blood loss anemia Patient reports multiple dark stools and hemoglobin was 11 from baseline of 16-17 a couple years prior Serial H&H and transfuse if needed : Postadmission downward trend of hemoglobin 11.8-9.6 Continue Protonix infusion started in the ED IV hydration and keep n.p.o. Type cross and transfuse if needed GI consult

## 2022-12-03 NOTE — ED Notes (Signed)
RN to bedside to assess pt and introduce self to pt.  Pt asked to remove something tight off his arm. Arm was exposed and a tourniquet was left on his arm from a previous blood draw. This RN removed it. Large indention in his arm. I will watch for changes.

## 2022-12-03 NOTE — Assessment & Plan Note (Signed)
EtOH level less than 10 Patient states he only drinks a beer every other day Will still do CIWA precautions

## 2022-12-03 NOTE — ED Provider Notes (Addendum)
Patient received in signout from Dr. Marisa Severin pending remainder of workup, reassessment and laceration repair.  Plain film of that left hand questions a foreign body.  I thoroughly irrigate the wound and repeat x-rays are reassuring without clear signs of any foreign body and I do not see any clinically.  Laceration repaired, as below.  This was well-tolerated.  He remains normotensive and normothermic hemoglobin is trending downwards and nursing staff tells me about his episodes of melena.  He is frequently sneezing in the room so we check a viral panel and he is tested positive for COVID-19.  He received another liter of LR due to his AKI.  I consulted medicine who agrees to admit.  Clinical Course as of 12/03/22 0559  Sat Dec 03, 2022  0334 Numbed up his laceration and cleaned it out thoroughly with sterile water with iodine.  Will repeat the x-ray [DS]  0447 7 sutures placed. Running locked, 3-0 ethilon [DS]    Clinical Course User Index [DS] Delton Prairie, MD     ..Laceration Repair  Date/Time: 12/03/2022 4:53 AM  Performed by: Delton Prairie, MD Authorized by: Delton Prairie, MD   Consent:    Consent obtained:  Verbal   Consent given by:  Patient   Risks, benefits, and alternatives were discussed: yes   Anesthesia:    Anesthesia method:  Local infiltration   Local anesthetic:  Lidocaine 1% w/o epi Laceration details:    Location:  Hand   Hand location:  L palm   Length (cm):  6 Exploration:    Hemostasis achieved with:  Direct pressure   Imaging obtained: x-ray     Foreign body noted: Possible foreign body noted prior to washout, repeat image without persistent evidence of such.   Treatment:    Area cleansed with:  Povidone-iodine   Amount of cleaning:  Extensive   Irrigation solution:  Sterile saline and sterile water Skin repair:    Repair method:  Sutures   Suture size:  3-0   Wound skin closure material used: Ethilon.   Suture technique:  Running locked   Number of  sutures:  7 Approximation:    Approximation:  Close Repair type:    Repair type:  Intermediate Post-procedure details:    Dressing:  Open (no dressing)   Procedure completion:  Tolerated well, no immediate complications     Delton Prairie, MD 12/03/22 1610    Delton Prairie, MD 12/03/22 [phone]

## 2022-12-03 NOTE — ED Notes (Signed)
Pt was cleaned from black, tray stool appears bloody in nature, Dr. Marisa Severin notified and to the bedside to exam pt. Pt's clothes removed, bed linens changed, peri care completed, pt placed in brief, gown applied, and reposition in bed for comfort. Warm blanket given

## 2022-12-03 NOTE — ED Notes (Signed)
Pt given ice water, okay per Dr. Katrinka Blazing, pt given urinal, advised still need urinal sample, pt verbalized understanding, pt given urinal

## 2022-12-04 LAB — BPAM RBC
Blood Product Expiration Date: 202312262359
Blood Product Expiration Date: 202401192359
Unit Type and Rh: 5100
Unit Type and Rh: 5100

## 2022-12-04 LAB — TYPE AND SCREEN
ABO/RH(D): O POS
Antibody Screen: NEGATIVE
Unit division: 0
Unit division: 0

## 2022-12-04 LAB — PREPARE RBC (CROSSMATCH)

## 2022-12-06 LAB — HEMOGLOBIN A1C
Hgb A1c MFr Bld: 5.8 % — ABNORMAL HIGH (ref 4.8–5.6)
Mean Plasma Glucose: 120 mg/dL

## 2024-08-12 ENCOUNTER — Other Ambulatory Visit: Payer: Self-pay

## 2024-08-12 ENCOUNTER — Encounter: Payer: Self-pay | Admitting: Emergency Medicine

## 2024-08-12 ENCOUNTER — Emergency Department

## 2024-08-12 ENCOUNTER — Inpatient Hospital Stay
Admission: EM | Admit: 2024-08-12 | Discharge: 2024-08-16 | DRG: 377 | Disposition: A | Discharge status: Home / Self Care | Attending: Internal Medicine | Admitting: Internal Medicine

## 2024-08-12 DIAGNOSIS — F1010 Alcohol abuse, uncomplicated: Secondary | ICD-10-CM | POA: Diagnosis present

## 2024-08-12 DIAGNOSIS — Z7902 Long term (current) use of antithrombotics/antiplatelets: Secondary | ICD-10-CM

## 2024-08-12 DIAGNOSIS — R2990 Unspecified symptoms and signs involving the nervous system: Secondary | ICD-10-CM

## 2024-08-12 DIAGNOSIS — Z91148 Patient's other noncompliance with medication regimen for other reason: Secondary | ICD-10-CM

## 2024-08-12 DIAGNOSIS — R471 Dysarthria and anarthria: Principal | ICD-10-CM | POA: Diagnosis present

## 2024-08-12 DIAGNOSIS — Z91199 Patient's noncompliance with other medical treatment and regimen due to unspecified reason: Secondary | ICD-10-CM

## 2024-08-12 DIAGNOSIS — T452X6A Underdosing of vitamins, initial encounter: Secondary | ICD-10-CM | POA: Diagnosis present

## 2024-08-12 DIAGNOSIS — G459 Transient cerebral ischemic attack, unspecified: Secondary | ICD-10-CM

## 2024-08-12 DIAGNOSIS — Z91128 Patient's intentional underdosing of medication regimen for other reason: Secondary | ICD-10-CM

## 2024-08-12 DIAGNOSIS — T464X6A Underdosing of angiotensin-converting-enzyme inhibitors, initial encounter: Secondary | ICD-10-CM | POA: Diagnosis present

## 2024-08-12 DIAGNOSIS — R7989 Other specified abnormal findings of blood chemistry: Secondary | ICD-10-CM | POA: Diagnosis present

## 2024-08-12 DIAGNOSIS — F1210 Cannabis abuse, uncomplicated: Secondary | ICD-10-CM | POA: Diagnosis present

## 2024-08-12 DIAGNOSIS — I420 Dilated cardiomyopathy: Secondary | ICD-10-CM | POA: Diagnosis present

## 2024-08-12 DIAGNOSIS — Y901 Blood alcohol level of 20-39 mg/100 ml: Secondary | ICD-10-CM | POA: Diagnosis present

## 2024-08-12 DIAGNOSIS — K922 Gastrointestinal hemorrhage, unspecified: Secondary | ICD-10-CM

## 2024-08-12 DIAGNOSIS — Z8616 Personal history of COVID-19: Secondary | ICD-10-CM

## 2024-08-12 DIAGNOSIS — T461X6A Underdosing of calcium-channel blockers, initial encounter: Secondary | ICD-10-CM | POA: Diagnosis present

## 2024-08-12 DIAGNOSIS — T45515A Adverse effect of anticoagulants, initial encounter: Secondary | ICD-10-CM | POA: Diagnosis present

## 2024-08-12 DIAGNOSIS — F1410 Cocaine abuse, uncomplicated: Secondary | ICD-10-CM | POA: Diagnosis present

## 2024-08-12 DIAGNOSIS — K264 Chronic or unspecified duodenal ulcer with hemorrhage: Principal | ICD-10-CM | POA: Diagnosis present

## 2024-08-12 DIAGNOSIS — K3189 Other diseases of stomach and duodenum: Secondary | ICD-10-CM | POA: Diagnosis present

## 2024-08-12 DIAGNOSIS — I5021 Acute systolic (congestive) heart failure: Secondary | ICD-10-CM | POA: Diagnosis present

## 2024-08-12 DIAGNOSIS — I160 Hypertensive urgency: Secondary | ICD-10-CM | POA: Diagnosis not present

## 2024-08-12 DIAGNOSIS — E785 Hyperlipidemia, unspecified: Secondary | ICD-10-CM | POA: Diagnosis present

## 2024-08-12 DIAGNOSIS — I110 Hypertensive heart disease with heart failure: Secondary | ICD-10-CM | POA: Diagnosis present

## 2024-08-12 DIAGNOSIS — Z79899 Other long term (current) drug therapy: Secondary | ICD-10-CM

## 2024-08-12 DIAGNOSIS — D6832 Hemorrhagic disorder due to extrinsic circulating anticoagulants: Secondary | ICD-10-CM | POA: Diagnosis present

## 2024-08-12 DIAGNOSIS — I10 Essential (primary) hypertension: Secondary | ICD-10-CM | POA: Diagnosis present

## 2024-08-12 DIAGNOSIS — Z7982 Long term (current) use of aspirin: Secondary | ICD-10-CM

## 2024-08-12 DIAGNOSIS — K2970 Gastritis, unspecified, without bleeding: Secondary | ICD-10-CM | POA: Diagnosis present

## 2024-08-12 DIAGNOSIS — F1910 Other psychoactive substance abuse, uncomplicated: Secondary | ICD-10-CM

## 2024-08-12 DIAGNOSIS — I959 Hypotension, unspecified: Secondary | ICD-10-CM | POA: Diagnosis present

## 2024-08-12 DIAGNOSIS — D62 Acute posthemorrhagic anemia: Secondary | ICD-10-CM | POA: Diagnosis present

## 2024-08-12 DIAGNOSIS — F17210 Nicotine dependence, cigarettes, uncomplicated: Secondary | ICD-10-CM | POA: Diagnosis present

## 2024-08-12 DIAGNOSIS — M419 Scoliosis, unspecified: Secondary | ICD-10-CM | POA: Diagnosis present

## 2024-08-12 HISTORY — DX: Transient cerebral ischemic attack, unspecified: G45.9

## 2024-08-12 LAB — CBC
HCT: 37.9 % — ABNORMAL LOW (ref 39.0–52.0)
Hemoglobin: 11.4 g/dL — ABNORMAL LOW (ref 13.0–17.0)
MCH: 22.6 pg — ABNORMAL LOW (ref 26.0–34.0)
MCHC: 30.1 g/dL (ref 30.0–36.0)
MCV: 75.2 fL — ABNORMAL LOW (ref 80.0–100.0)
Platelets: 316 K/uL (ref 150–400)
RBC: 5.04 MIL/uL (ref 4.22–5.81)
RDW: 20.1 % — ABNORMAL HIGH (ref 11.5–15.5)
WBC: 7.6 K/uL (ref 4.0–10.5)
nRBC: 0 % (ref 0.0–0.2)

## 2024-08-12 LAB — COMPREHENSIVE METABOLIC PANEL WITH GFR
ALT: 13 U/L (ref 0–44)
AST: 23 U/L (ref 15–41)
Albumin: 4.1 g/dL (ref 3.5–5.0)
Alkaline Phosphatase: 92 U/L (ref 38–126)
Anion gap: 13 (ref 5–15)
BUN: 14 mg/dL (ref 6–20)
CO2: 24 mmol/L (ref 22–32)
Calcium: 9.2 mg/dL (ref 8.9–10.3)
Chloride: 102 mmol/L (ref 98–111)
Creatinine, Ser: 1.04 mg/dL (ref 0.61–1.24)
GFR, Estimated: >60 mL/min (ref >60)
Glucose, Bld: 101 mg/dL — ABNORMAL HIGH (ref 70–99)
Potassium: 3.5 mmol/L (ref 3.5–5.1)
Sodium: 139 mmol/L (ref 135–145)
Total Bilirubin: 0.5 mg/dL (ref 0.0–1.2)
Total Protein: 7.7 g/dL (ref 6.5–8.1)

## 2024-08-12 LAB — DIFFERENTIAL
Abs Immature Granulocytes: 0.02 K/uL (ref 0.00–0.07)
Basophils Absolute: 0 K/uL (ref 0.0–0.1)
Basophils Relative: 1 %
Eosinophils Absolute: 0.4 K/uL (ref 0.0–0.5)
Eosinophils Relative: 5 %
Immature Granulocytes: 0 %
Lymphocytes Relative: 23 %
Lymphs Abs: 1.7 K/uL (ref 0.7–4.0)
Monocytes Absolute: 0.7 K/uL (ref 0.1–1.0)
Monocytes Relative: 9 %
Neutro Abs: 4.8 K/uL (ref 1.7–7.7)
Neutrophils Relative %: 62 %

## 2024-08-12 LAB — PROTIME-INR
INR: 0.9 (ref 0.8–1.2)
Prothrombin Time: 13.1 s (ref 11.4–15.2)

## 2024-08-12 LAB — APTT: aPTT: 29 s (ref 24–36)

## 2024-08-12 LAB — ETHANOL: Alcohol, Ethyl (B): 37 mg/dL — ABNORMAL HIGH (ref <15)

## 2024-08-12 LAB — TROPONIN I (HIGH SENSITIVITY): Troponin I (High Sensitivity): 28 ng/L — ABNORMAL HIGH (ref <18)

## 2024-08-12 MED ORDER — FOLIC ACID 1 MG PO TABS
1.0000 mg | ORAL_TABLET | Freq: Every day | ORAL | Status: DC
  Administered 2024-08-13 – 2024-08-16 (×4): 1 mg via ORAL
  Filled 2024-08-12 (×4): qty 1

## 2024-08-12 MED ORDER — LABETALOL HCL 5 MG/ML IV SOLN: 20.0000 mg | INTRAVENOUS | Status: DC | PRN

## 2024-08-12 MED ORDER — ACETAMINOPHEN 325 MG PO TABS
650.0000 mg | ORAL_TABLET | ORAL | Status: DC | PRN
Start: 2024-08-12 — End: 2024-08-16
  Administered 2024-08-12: 650 mg via ORAL
  Filled 2024-08-12: qty 2

## 2024-08-12 MED ORDER — LORAZEPAM 2 MG/ML IJ SOLN
1.0000 mg | INTRAMUSCULAR | Status: AC | PRN
  Administered 2024-08-13: 1 mg via INTRAVENOUS
  Filled 2024-08-12: qty 4

## 2024-08-12 MED ORDER — LISINOPRIL 5 MG PO TABS
10.0000 mg | ORAL_TABLET | Freq: Every day | ORAL | Status: DC
  Administered 2024-08-13 – 2024-08-14 (×2): 10 mg via ORAL
  Filled 2024-08-12: qty 1
  Filled 2024-08-12: qty 2

## 2024-08-12 MED ORDER — ENOXAPARIN SODIUM 40 MG/0.4ML IJ SOSY
40.0000 mg | PREFILLED_SYRINGE | INTRAMUSCULAR | Status: DC
  Administered 2024-08-13 – 2024-08-16 (×3): 40 mg via SUBCUTANEOUS
  Filled 2024-08-12 (×3): qty 0.4

## 2024-08-12 MED ORDER — STROKE: EARLY STAGES OF RECOVERY BOOK: Freq: Once | Status: DC

## 2024-08-12 MED ORDER — AMLODIPINE BESYLATE 5 MG PO TABS
5.0000 mg | ORAL_TABLET | Freq: Every day | ORAL | Status: DC
  Administered 2024-08-13 – 2024-08-14 (×2): 5 mg via ORAL
  Filled 2024-08-12 (×2): qty 1

## 2024-08-12 MED ORDER — ACETAMINOPHEN 160 MG/5ML PO SOLN: 650.0000 mg | ORAL | Status: DC | PRN

## 2024-08-12 MED ORDER — SENNOSIDES-DOCUSATE SODIUM 8.6-50 MG PO TABS: 1.0000 | ORAL_TABLET | Freq: Every evening | ORAL | Status: DC | PRN

## 2024-08-12 MED ORDER — ACETAMINOPHEN 650 MG RE SUPP: 650.0000 mg | RECTAL | Status: DC | PRN

## 2024-08-12 MED ORDER — SODIUM CHLORIDE 0.9 % IV SOLN: INTRAVENOUS | Status: AC

## 2024-08-12 MED ORDER — ENOXAPARIN SODIUM 40 MG/0.4ML IJ SOSY
40.0000 mg | PREFILLED_SYRINGE | INTRAMUSCULAR | Status: DC
Start: 2024-08-12 — End: 2024-08-12

## 2024-08-12 MED ORDER — ADULT MULTIVITAMIN W/MINERALS CH
1.0000 | ORAL_TABLET | Freq: Every day | ORAL | Status: DC
  Administered 2024-08-13 – 2024-08-16 (×3): 1 via ORAL
  Filled 2024-08-12 (×4): qty 1

## 2024-08-12 MED ORDER — THIAMINE MONONITRATE 100 MG PO TABS
100.0000 mg | ORAL_TABLET | Freq: Every day | ORAL | Status: DC
  Administered 2024-08-13 – 2024-08-16 (×4): 100 mg via ORAL
  Filled 2024-08-12 (×4): qty 1

## 2024-08-12 MED ORDER — HYDRALAZINE HCL 20 MG/ML IJ SOLN
10.0000 mg | Freq: Four times a day (QID) | INTRAMUSCULAR | Status: DC | PRN
  Administered 2024-08-13 – 2024-08-15 (×5): 10 mg via INTRAVENOUS
  Filled 2024-08-12 (×5): qty 1

## 2024-08-12 MED ORDER — THIAMINE HCL 100 MG/ML IJ SOLN: 100.0000 mg | Freq: Every day | INTRAMUSCULAR | Status: DC

## 2024-08-12 MED ORDER — LORAZEPAM 1 MG PO TABS: 1.0000 mg | ORAL_TABLET | ORAL | Status: AC | PRN

## 2024-08-12 NOTE — Assessment & Plan Note (Signed)
 Likely TIA as all imaging was negative for any acute lesion, MRI with concern of chronic small vessel disease.  CTA head and neck was negative for any LVO.  A1c of 5.4, elevated total cholesterol at 202, LDL 110 and HDL of 84.  Echocardiogram with new diagnosis of HFrEF, EF of 35 to 40% and grade 1 diastolic dysfunction. - Patient was started on aspirin  and Plavix  as recommended by neurology which is being held now due to GI bleed. - Continue with statin

## 2024-08-12 NOTE — ED Triage Notes (Signed)
 BIB ems for hypertension, has not been taking for the last 6 years  Per pt I think I might have had a stroke yesterday. I had a headache, my BP was up, I was having trouble getting words out, and I was dizzy.  Pt unsure of the time of onset  All symptoms have resolved except hypertension.

## 2024-08-12 NOTE — ED Provider Notes (Signed)
 [facility] Provider Note    Event Date/Time   First MD Initiated Contact with Patient 08/12/24 2150     (approximate)   History   Hypertension   HPI  Fred [name] is a 48 y.o. male reports a history of hypertension untreated for about 5 years.    Patient reports yesterday not exactly sure of the exact time but sometime yesterday afternoon he started feeling lightheaded, felt like he could not speak words correctly and has had a very mild headache at the time.  He started to feel better now, but comes for concerns of just not feeling right.  Reports he just feels like he might had a stroke  He has not any facial weakness no numbness.  No difficulty walking or using his arms or legs.  Symptoms started yesterday he is not able to pin down exact time but sometime in the afternoon.  Greater than 24 hours ago   He denies using any alcohol recently or any drug use.     Physical Exam   Triage Vital Signs: ED Triage Vitals  Encounter Vitals Group     BP 08/12/24 2137 (!) 145/98     Girls Systolic BP Percentile --      Girls Diastolic BP Percentile --      Boys Systolic BP Percentile --      Boys Diastolic BP Percentile --      Pulse Rate 08/12/24 2137 86     Resp 08/12/24 2137 15     Temp 08/12/24 2142 99.5 F (37.5 C)     Temp Source 08/12/24 2137 Oral     SpO2 08/12/24 2137 100 %     Weight 08/12/24 2140 160 lb (72.6 kg)     Height 08/12/24 2140 5' (1.524 m)     Head Circumference --      Peak Flow --      Pain Score --      Pain Loc --      Pain Education --      Exclude from Growth Chart --     Most recent vital signs: Vitals:   08/12/24 2200 08/12/24 2306  BP: (!) 158/121 (!) 178/112  Pulse: 88 87  Resp: 20 (!) 26  Temp:    SpO2: 100% 100%     General: Awake, no distress.  Normocephalic atraumatic.  Appears to have a slight left-sided esotropia.  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  He reports it feels pretty normal for him, but he does appear to have at least some slurring of his speech.  There is no facial droop.  He is alert he is well-oriented no distress no tremulousness CV:  Good peripheral perfusion.  Normal tones and rate Resp:  Normal effort.  Clear bilateral Abd:  No distention.  Soft nontender nondistended Other:  Moves all extremities normally.  No obvious ataxia.  Denies any sensory deficits.  Facial expressions are normal.  Only noted abnormalities are esotropia which the patient reports to be chronic, and I suspect at least mild dysarthria   ED Results / Procedures / Treatments   Labs (all labs ordered are listed, but only abnormal results are displayed) Labs Reviewed  ETHANOL - Abnormal; Notable for the following components:      Result Value   Alcohol, Ethyl (B) 37 (*)    All other components within normal limits  CBC - Abnormal; Notable for the following components:   Hemoglobin 11.4 (*)    HCT 37.9 (*)    MCV 75.2 (*)    MCH 22.6 (*)    RDW 20.1 (*)    All other components within normal limits  COMPREHENSIVE METABOLIC PANEL WITH GFR - Abnormal; Notable for the following components:   Glucose, Bld 101 (*)    All other components within normal limits  TROPONIN I (HIGH SENSITIVITY) - Abnormal; Notable for the following components:   Troponin I (High Sensitivity) 28 (*)    All other components within normal limits  PROTIME-INR  APTT  DIFFERENTIAL  URINE DRUG SCREEN, QUALITATIVE ([facility] ONLY)     EKG  Interpreted by me at 2145 heart rate 85 QRS 100 QTc 416 Normal sinus rhythm, findings concerning for left ventricular hypertrophy with repolarization abnormality or myocardial ischemia.  The patient denies any associated chest pain today or yesterday making me think this is more likely LVH.  He also has a history of untreated hypertension.  Will add on troponin.  When compared to EKG from 2023  appears to have similar morphology   RADIOLOGY  CT HEAD WO CONTRAST Result Date: 08/12/2024 EXAM: CT HEAD WITHOUT CONTRAST 08/12/2024 10:08:08 PM TECHNIQUE: CT of the head was performed without the administration of intravenous contrast. Automated exposure control, iterative reconstruction, and/or weight based adjustment of the mA/kV was utilized to reduce the radiation dose to as low as reasonably achievable. COMPARISON: 10/29/2020 CLINICAL HISTORY: Neuro deficit, acute, stroke suspected; dysarthria for 1 day, eval for stroke. BIB ems for hypertension, has not been taking for the last 6 years. Per pt I think I might have had a stroke yesterday. I had a headache, my BP was up, I was having trouble getting words out, and I was dizzy. Pt unsure of the time of onset. All symptoms have resolved except hypertension. FINDINGS: BRAIN AND VENTRICLES: No acute hemorrhage. No evidence of acute infarct. No hydrocephalus. No extra-axial collection. No mass effect or midline shift. ORBITS: No acute abnormality. SINUSES: No acute abnormality. SOFT TISSUES AND SKULL: No acute soft tissue abnormality. No skull fracture. IMPRESSION: 1. No acute intracranial abnormality. Electronically signed by: Franky Stanford MD 08/12/2024 10:29 PM EDT RP Workstation: [id number]      PROCEDURES:  Critical Care performed: No  Procedures   MEDICATIONS ORDERED IN ED: Medications - No data to display   IMPRESSION / MDM / ASSESSMENT AND PLAN / ED COURSE  I reviewed the triage vital signs and the nursing notes.                              Differential diagnosis includes, but is not limited to, possible stroke, ethanol intoxication, hypertensive urgency though blood pressure now 144/98, electrolyte abnormality etc.  Based on the patient's concerns of dysarthria acutely greater than 24 hours ago he is outside any acute stroke treatment window and he has no findings to be highly suggestive of large vessel occlusion.  He is not a  thrombolytic candidate and I am not certain as to yet if he may actually have a stroke.  The ECG is abnormal but appears to be chronic probable LVH.  Troponin sent but no associated chest pain  Await further testing.  Patient's presentation is most consistent with acute complicated illness / injury requiring diagnostic workup.   Thus far imaging reassuring.  Very minimally elevated troponin seems to be in keeping with previous.  Doubt ACS.  Labs show normal white count, very mild anemia.  Discussed with the patient, recommendation for admission for further evaluation as to his episode of dysarthria.  Suspicious for possible mild stroke or TIA.  Patient agreeable with admission.  Blood pressure elevating somewhat now, discussed with hospitalist Dr. Lawence will plan to see and admit.       FINAL CLINICAL IMPRESSION(S) / ED DIAGNOSES   Final diagnoses:  Dysarthria  Stroke-like episode     Rx / DC Orders   ED Discharge Orders     None        Note:  This document was prepared using Dragon voice recognition software and may include unintentional dictation errors.   Dicky Anes, MD 08/12/24 [phone]

## 2024-08-12 NOTE — Assessment & Plan Note (Addendum)
 Likely secondary to being noncompliant, patient was not taking his medications at home. Blood pressure improved. - Continue amlodipine  and losartan  -Carvedilol  was added today by cardiology

## 2024-08-12 NOTE — H&P (Addendum)
 [name]   PATIENT NAME: Fred [name]    MR#:  [id number]  DATE OF BIRTH:  Jul 18, 1976  DATE OF ADMISSION:  08/12/2024  PRIMARY CARE PHYSICIAN: Pcp, No   Patient is coming from: Home  REQUESTING/REFERRING PHYSICIAN: Dicky Anes, MD   CHIEF COMPLAINT:   Chief Complaint  Patient presents with   Hypertension    HISTORY OF PRESENT ILLNESS:  Fred [name] is a 48 y.o. African-American male with medical history significant for essential hypertension with noncompliance to medications for 5 years, and likely alcohol abuse, who presented to the emergency room with acute onset of lightheadedness yesterday afternoon with dysarthria and mild headache.  He felt better now and is dysarthria resolved.  He stated that he has not just been feeling right and was concerned about having a stroke.  He denied any focal muscle weakness or paresthesias.  No tinnitus or vertigo.  No witnessed seizures.  No urinary or stool incontinence.  No chest pain or palpitations.  No cough or wheezing or dyspnea.  No dysuria, oliguria or hematuria or flank pain.  ED Course: When the patient came to the ER, temperature was 99.5, BP was 145/98 with otherwise normal vital signs.  BP was later on up to 178/112 with respiratory rate of 26.  Labs revealed unremarkable CMP except for borderline potassium of 3.5.  Hemoglobin was 11.4 and hematocrit 37.9 with microcytosis and coag profile was normal. EKG as reviewed by me : EKG showed sinus rhythm with a rate of 85 with probable left atrial enlargement and T wave inversion laterally and inferiorly. Imaging: Noncontrast head CT scan revealed no acute intracranial abnormalities.  The patient was placed on CIWA protocol.  He will be admitted to a progressive unit observation bed for further evaluation and management. PAST MEDICAL HISTORY:   Past Medical History:  Diagnosis Date   Hypertension    Medical history non-contributory    Scoliosis     PAST  SURGICAL HISTORY:   Past Surgical History:  Procedure Laterality Date   NO PAST SURGERIES      SOCIAL HISTORY:   Social History   Tobacco Use   Smoking status: Every Day    Types: Cigarettes   Smokeless tobacco: Never   Tobacco comments:    pt refused  Substance Use Topics   Alcohol use: Yes    Alcohol/week: 1.0 standard drink of alcohol    Types: 1 Cans of beer per week  He admitted to drinking 2-4 alcoholic drinks per day.  FAMILY HISTORY:  History reviewed. No pertinent family history.  DRUG ALLERGIES:  No Known Allergies  REVIEW OF SYSTEMS:   ROS As per history of present illness. All pertinent systems were reviewed above. Constitutional, HEENT, cardiovascular, respiratory, GI, GU, musculoskeletal, neuro, psychiatric, endocrine, integumentary and hematologic systems were reviewed and are otherwise negative/unremarkable except for positive findings mentioned above in the HPI.   MEDICATIONS AT HOME:   Prior to Admission medications   Medication Sig Start Date End Date Taking? Authorizing Provider  amLODipine  (NORVASC ) 5 MG tablet Take 1 tablet (5 mg total) by mouth daily. Patient not taking: Reported on 08/12/2024 11/11/20   Saunders Shona CROME, PA-C  lisinopril  (ZESTRIL ) 10 MG tablet Take 1 tablet (10 mg total) by mouth daily. Patient not taking: Reported on 08/12/2024 11/11/20   Saunders Shona CROME, PA-C  naltrexone  (DEPADE) 50 MG tablet Take 1 tablet (50 mg total) by mouth daily. Patient not taking: Reported on 12/03/2022 12/18/18  Clapacs, Norleen DASEN, MD  thiamine  100 MG tablet Take 1 tablet (100 mg total) by mouth daily. Patient not taking: Reported on 08/12/2024 12/18/18   Clapacs, Norleen DASEN, MD      VITAL SIGNS:  Blood pressure (!) 142/100, pulse 93, temperature 98 F (36.7 C), temperature source Oral, resp. rate (!) 27, height 5' (1.524 m), weight 72.6 kg, SpO2 100%.  PHYSICAL EXAMINATION:  Physical Exam  GENERAL:  48 y.o.-year-old African-American male patient lying in  the bed with no acute distress.  EYES: Pupils equal, round, reactive to light and accommodation. No scleral icterus. Extraocular muscles intact.  HEENT: Head atraumatic, normocephalic. Oropharynx and nasopharynx clear.  NECK:  Supple, no jugular venous distention. No thyroid enlargement, no tenderness.  LUNGS: Normal breath sounds bilaterally, no wheezing, rales,rhonchi or crepitation. No use of accessory muscles of respiration.  CARDIOVASCULAR: Regular rate and rhythm, S1, S2 normal. No murmurs, rubs, or gallops.  ABDOMEN: Soft, nondistended, nontender. Bowel sounds present. No organomegaly or mass.  EXTREMITIES: No pedal edema, cyanosis, or clubbing.  NEUROLOGIC: Cranial nerves II through XII are intact. Muscle strength 5/5 in all extremities. Sensation intact. Gait not checked.  PSYCHIATRIC: The patient is alert and oriented x 3.  Normal affect and good eye contact. SKIN: No obvious rash, lesion, or ulcer.   LABORATORY PANEL:   CBC Recent Labs  Lab 08/12/24 2146  WBC 7.6  HGB 11.4*  HCT 37.9*  PLT 316   ------------------------------------------------------------------------------------------------------------------  Chemistries  Recent Labs  Lab 08/12/24 2146  NA 139  K 3.5  CL 102  CO2 24  GLUCOSE 101*  BUN 14  CREATININE 1.04  CALCIUM  9.2  AST 23  ALT 13  ALKPHOS 92  BILITOT 0.5   ------------------------------------------------------------------------------------------------------------------  Cardiac Enzymes No results for input(s): TROPONINI in the last 168 hours. ------------------------------------------------------------------------------------------------------------------  RADIOLOGY:  MR BRAIN WO CONTRAST Result Date: 08/13/2024 EXAM: MR Brain without Intravenous Contrast. CLINICAL HISTORY: Neuro deficit, acute, stroke suspected. Came in with untreated hypertension; patient states felt dizzy but does not know when it started. TECHNIQUE: Magnetic  resonance images of the brain without intravenous contrast in multiple planes. CONTRAST: None. COMPARISON: None provided. FINDINGS: BRAIN: Minimal nonspecific white matter T2-weighted signal hyperintensities, which may be associated with early chronic small vessel disease or migraine headaches. No restricted diffusion to indicate acute infarction. No intracranial mass or hemorrhage. No midline shift or extra-axial fluid collection. No cerebellar tonsillar ectopia. The central arterial and venous flow voids are patent. VENTRICLES: No hydrocephalus. ORBITS: The orbits are normal. SINUSES AND MASTOIDS: Right maxillary sinus retention cyst. The remaining sinuses and mastoid air cells are clear. BONES: No acute fracture or focal osseous lesion. IMPRESSION: 1. No acute findings. 2. Minimal nonspecific white matter T2-weighted signal hyperintensities, possibly related to early chronic small vessel disease or migraine headaches. Electronically signed by: Franky Stanford MD 08/13/2024 12:34 AM EDT RP Workstation: [id number]   CT HEAD WO CONTRAST Result Date: 08/12/2024 EXAM: CT HEAD WITHOUT CONTRAST 08/12/2024 10:08:08 PM TECHNIQUE: CT of the head was performed without the administration of intravenous contrast. Automated exposure control, iterative reconstruction, and/or weight based adjustment of the mA/kV was utilized to reduce the radiation dose to as low as reasonably achievable. COMPARISON: 10/29/2020 CLINICAL HISTORY: Neuro deficit, acute, stroke suspected; dysarthria for 1 day, eval for stroke. BIB ems for hypertension, has not been taking for the last 6 years. Per pt I think I might have had a stroke yesterday. I had a headache, my BP was up, I was  having trouble getting words out, and I was dizzy. Pt unsure of the time of onset. All symptoms have resolved except hypertension. FINDINGS: BRAIN AND VENTRICLES: No acute hemorrhage. No evidence of acute infarct. No hydrocephalus. No extra-axial collection. No mass  effect or midline shift. ORBITS: No acute abnormality. SINUSES: No acute abnormality. SOFT TISSUES AND SKULL: No acute soft tissue abnormality. No skull fracture. IMPRESSION: 1. No acute intracranial abnormality. Electronically signed by: Franky Stanford MD 08/12/2024 10:29 PM EDT RP Workstation: [id number]      IMPRESSION AND PLAN:  Assessment and Plan: * Hypertensive urgency - The patient will be admitted to an observation progressive unit bed. - This is clearly secondary to noncompliance. - Will restart his antihypertensive therapy. - Will be placed on as needed IV hydralazine  and labetalol . - He is past 24 hours from the onset of his symptoms. - For potential alcohol abuse, we will start the patient on CIWA protocol.  TIA (transient ischemic attack) - We will follow neuro checks q.4 hours for 24 hours.   - The patient will be placed on aspirin .   - Will obtain a brain MRI without contrast as well as 2D echo with bubble study .   - A neurology consultation  as well as physical/occupation/speech therapy consults will be obtained in a.m..  I notified Dr. Michaela about the patient. - The patient will be placed on statin therapy and fasting lipids will be checked.    DVT prophylaxis: Lovenox .  Advanced Care Planning:  Code Status: full code.  Family Communication:  The plan of care was discussed in details with the patient (and family). I answered all questions. The patient agreed to proceed with the above mentioned plan. Further management will depend upon hospital course. Disposition Plan: Back to previous home environment Consults called: none.  All the records are reviewed and case discussed with ED provider.  Status is: Observation  I certify that at the time of admission, it is my clinical judgment that the patient will require inpatient hospital care extending more than 2 midnights.                            Dispo: The patient is from: Home              Anticipated d/c is  to: Home              Patient currently is not medically stable to d/c.              Difficult to place patient: No  Madison DELENA Peaches M.D on 08/13/2024 at 2:21 AM  [facility]   From 7 PM-7 AM, contact night-coverage [email or web address]  CC: Primary care physician; Pcp, No

## 2024-08-13 ENCOUNTER — Observation Stay

## 2024-08-13 ENCOUNTER — Observation Stay
Admit: 2024-08-13 | Discharge: 2024-08-13 | Disposition: A | Discharge status: Home / Self Care | Attending: Family Medicine

## 2024-08-13 DIAGNOSIS — D62 Acute posthemorrhagic anemia: Secondary | ICD-10-CM | POA: Diagnosis not present

## 2024-08-13 DIAGNOSIS — G459 Transient cerebral ischemic attack, unspecified: Secondary | ICD-10-CM

## 2024-08-13 DIAGNOSIS — I160 Hypertensive urgency: Secondary | ICD-10-CM | POA: Diagnosis not present

## 2024-08-13 DIAGNOSIS — K922 Gastrointestinal hemorrhage, unspecified: Secondary | ICD-10-CM

## 2024-08-13 DIAGNOSIS — R571 Hypovolemic shock: Secondary | ICD-10-CM

## 2024-08-13 DIAGNOSIS — E785 Hyperlipidemia, unspecified: Secondary | ICD-10-CM

## 2024-08-13 LAB — CBC WITH DIFFERENTIAL/PLATELET
Abs Immature Granulocytes: 0.08 K/uL — ABNORMAL HIGH (ref 0.00–0.07)
Basophils Absolute: 0.1 K/uL (ref 0.0–0.1)
Basophils Relative: 0 %
Eosinophils Absolute: 0.1 K/uL (ref 0.0–0.5)
Eosinophils Relative: 1 %
HCT: 29.8 % — ABNORMAL LOW (ref 39.0–52.0)
Hemoglobin: 8.9 g/dL — ABNORMAL LOW (ref 13.0–17.0)
Immature Granulocytes: 1 %
Lymphocytes Relative: 17 %
Lymphs Abs: 2.1 K/uL (ref 0.7–4.0)
MCH: 23 pg — ABNORMAL LOW (ref 26.0–34.0)
MCHC: 29.9 g/dL — ABNORMAL LOW (ref 30.0–36.0)
MCV: 77 fL — ABNORMAL LOW (ref 80.0–100.0)
Monocytes Absolute: 0.7 K/uL (ref 0.1–1.0)
Monocytes Relative: 6 %
Neutro Abs: 9.4 K/uL — ABNORMAL HIGH (ref 1.7–7.7)
Neutrophils Relative %: 75 %
Platelets: 314 K/uL (ref 150–400)
RBC: 3.87 MIL/uL — ABNORMAL LOW (ref 4.22–5.81)
RDW: 20.1 % — ABNORMAL HIGH (ref 11.5–15.5)
WBC: 12.5 K/uL — ABNORMAL HIGH (ref 4.0–10.5)
nRBC: 0 % (ref 0.0–0.2)

## 2024-08-13 LAB — ECHOCARDIOGRAM COMPLETE BUBBLE STUDY
AR max vel: 2.01 cm2
AV Area VTI: 2.16 cm2
AV Area mean vel: 2.06 cm2
AV Mean grad: 2.5 mmHg
AV Peak grad: 5.2 mmHg
Ao pk vel: 1.14 m/s
Area-P 1/2: 4.86 cm2
Calc EF: 30 %
MV VTI: 1.72 cm2
S' Lateral: 3.8 cm
Single Plane A2C EF: 30 %
Single Plane A4C EF: 32.5 %

## 2024-08-13 LAB — COMPREHENSIVE METABOLIC PANEL WITH GFR
ALT: 11 U/L (ref 0–44)
AST: 22 U/L (ref 15–41)
Albumin: 3.4 g/dL — ABNORMAL LOW (ref 3.5–5.0)
Alkaline Phosphatase: 62 U/L (ref 38–126)
Anion gap: 12 (ref 5–15)
BUN: 31 mg/dL — ABNORMAL HIGH (ref 6–20)
CO2: 23 mmol/L (ref 22–32)
Calcium: 8.4 mg/dL — ABNORMAL LOW (ref 8.9–10.3)
Chloride: 104 mmol/L (ref 98–111)
Creatinine, Ser: 1.22 mg/dL (ref 0.61–1.24)
GFR, Estimated: >60 mL/min (ref >60)
Glucose, Bld: 182 mg/dL — ABNORMAL HIGH (ref 70–99)
Potassium: 4.2 mmol/L (ref 3.5–5.1)
Sodium: 139 mmol/L (ref 135–145)
Total Bilirubin: 0.6 mg/dL (ref 0.0–1.2)
Total Protein: 6.4 g/dL — ABNORMAL LOW (ref 6.5–8.1)

## 2024-08-13 LAB — URINE DRUG SCREEN, QUALITATIVE (ARMC ONLY)
Amphetamines, Ur Screen: NOT DETECTED
Barbiturates, Ur Screen: NOT DETECTED
Benzodiazepine, Ur Scrn: NOT DETECTED
Cannabinoid 50 Ng, Ur ~~LOC~~: POSITIVE — AB
Cocaine Metabolite,Ur ~~LOC~~: POSITIVE — AB
MDMA (Ecstasy)Ur Screen: NOT DETECTED
Methadone Scn, Ur: NOT DETECTED
Opiate, Ur Screen: NOT DETECTED
Phencyclidine (PCP) Ur S: NOT DETECTED
Tricyclic, Ur Screen: NOT DETECTED

## 2024-08-13 LAB — PROTIME-INR
INR: 1.1 (ref 0.8–1.2)
Prothrombin Time: 14.5 s (ref 11.4–15.2)

## 2024-08-13 LAB — GLUCOSE, CAPILLARY
Glucose-Capillary: 110 mg/dL — ABNORMAL HIGH (ref 70–99)
Glucose-Capillary: 86 mg/dL (ref 70–99)

## 2024-08-13 LAB — LIPID PANEL
Cholesterol: 202 mg/dL — ABNORMAL HIGH (ref 0–200)
HDL: 84 mg/dL (ref >40)
LDL Cholesterol: 110 mg/dL — ABNORMAL HIGH (ref 0–99)
Total CHOL/HDL Ratio: 2.4 ratio
Triglycerides: 42 mg/dL (ref <150)
VLDL: 8 mg/dL (ref 0–40)

## 2024-08-13 LAB — HEMOGLOBIN A1C
Hgb A1c MFr Bld: 5.4 % (ref 4.8–5.6)
Mean Plasma Glucose: 108 mg/dL

## 2024-08-13 LAB — HIV ANTIBODY (ROUTINE TESTING W REFLEX): HIV Screen 4th Generation wRfx: NONREACTIVE

## 2024-08-13 MED ORDER — PANTOPRAZOLE SODIUM 40 MG IV SOLR
80.0000 mg | Freq: Once | INTRAVENOUS | Status: AC
  Administered 2024-08-14: 80 mg via INTRAVENOUS
  Filled 2024-08-13: qty 20

## 2024-08-13 MED ORDER — OCTREOTIDE LOAD VIA INFUSION
50.0000 ug | Freq: Once | INTRAVENOUS | Status: AC
  Administered 2024-08-14: 50 ug via INTRAVENOUS
  Filled 2024-08-13: qty 25

## 2024-08-13 MED ORDER — CLOPIDOGREL BISULFATE 75 MG PO TABS
75.0000 mg | ORAL_TABLET | Freq: Every day | ORAL | Status: DC
  Administered 2024-08-13: 75 mg via ORAL
  Filled 2024-08-13: qty 1

## 2024-08-13 MED ORDER — IOHEXOL 350 MG/ML SOLN
75.0000 mL | Freq: Once | INTRAVENOUS | Status: AC | PRN
  Administered 2024-08-13: 75 mL via INTRAVENOUS

## 2024-08-13 MED ORDER — SODIUM CHLORIDE 0.9 % IV BOLUS
1000.0000 mL | Freq: Once | INTRAVENOUS | Status: AC
  Administered 2024-08-13: 1000 mL via INTRAVENOUS

## 2024-08-13 MED ORDER — ATORVASTATIN CALCIUM 20 MG PO TABS
40.0000 mg | ORAL_TABLET | Freq: Every day | ORAL | Status: DC
  Administered 2024-08-13 – 2024-08-16 (×4): 40 mg via ORAL
  Filled 2024-08-13 (×4): qty 2

## 2024-08-13 MED ORDER — CHLORHEXIDINE GLUCONATE CLOTH 2 % EX PADS
6.0000 | MEDICATED_PAD | Freq: Every day | CUTANEOUS | Status: DC
  Administered 2024-08-13 – 2024-08-14 (×2): 6 via TOPICAL

## 2024-08-13 MED ORDER — SODIUM CHLORIDE 0.9 % IV SOLN
50.0000 ug/h | INTRAVENOUS | Status: DC
  Administered 2024-08-14 (×3): 50 ug/h via INTRAVENOUS
  Filled 2024-08-13 (×7): qty 1

## 2024-08-13 MED ORDER — PANTOPRAZOLE SODIUM 40 MG IV SOLR
40.0000 mg | Freq: Two times a day (BID) | INTRAVENOUS | Status: DC
  Administered 2024-08-14 – 2024-08-15 (×4): 40 mg via INTRAVENOUS
  Filled 2024-08-13 (×4): qty 10

## 2024-08-13 MED ORDER — ONDANSETRON HCL 4 MG/2ML IJ SOLN
4.0000 mg | INTRAMUSCULAR | Status: DC | PRN
  Administered 2024-08-13: 4 mg via INTRAVENOUS
  Filled 2024-08-13: qty 2

## 2024-08-13 MED ORDER — ASPIRIN 81 MG PO TBEC
81.0000 mg | DELAYED_RELEASE_TABLET | Freq: Every day | ORAL | Status: DC
  Administered 2024-08-13: 81 mg via ORAL
  Filled 2024-08-13: qty 1

## 2024-08-13 NOTE — Progress Notes (Addendum)
 OT Screen Note  Patient Details Name: Fred [name] MRN: [id number] DOB: [birth year]   Cancelled Treatment:    Reason Eval/Treat Not Completed: OT screened, no needs identified, will sign off. Consult received, chart reviewed. Spoke with evaluating PT. Pt independent and no skilled OT needs identified at this time. Will sign off. Please re-consult if additional needs arise.   Socrates Cahoon R., MPH, MS, OTR/L ascom [phone] 08/13/24, 10:39 AM

## 2024-08-13 NOTE — Progress Notes (Signed)
*  PRELIMINARY RESULTS* Echocardiogram 2D Echocardiogram has been performed.  Fred [name] 08/13/2024, 8:52 AM

## 2024-08-13 NOTE — Consult Note (Signed)
 NEUROLOGY CONSULT NOTE   Date of service: August 13, 2024 Patient Name: Fred Fred [name] MRN:  [id number] DOB:  Jun 07, 1976 Chief Complaint: dysarthria Requesting Provider: Dorinda Drue DASEN, MD  History of Present Illness  Fred Fred [name] is Fred [name] 48 y.o. male with hx of hypertension who presents with an episode of transient dysarthria that occurred on 8/31.  He states that he had Fred [name] transient episode of severe dysarthria that was present until he went to bed that night.  When he awoke on 9/1, it was markedly improved but he still feels like he has Fred [name] very mild dysarthria.  He noticed that his blood pressure was much higher than his typical and therefore came to the emergency department.  In the ER, he was admitted for concern for mild stroke and had an MRI which was negative.   LKW: 8/31 Modified rankin score: 0-Completely asymptomatic and back to baseline post- stroke IV Thrombolysis: No, outside of window EVT: No, outside of window   NIHSS components Score: Comment  1a Level of Conscious 0[]  1[]  2[]  3[]      1b LOC Questions 0[]  1[]  2[]       1c LOC Commands 0[]  1[]  2[]       2 Best Gaze 0[]  1[]  2[]       3 Visual 0[]  1[]  2[]  3[]      4 Facial Palsy 0[]  1[]  2[]  3[]      5a Motor Arm - left 0[]  1[]  2[]  3[]  4[]  UN[]    5b Motor Arm - Right 0[]  1[]  2[]  3[]  4[]  UN[]    6a Motor Leg - Left 0[]  1[]  2[]  3[]  4[]  UN[]    6b Motor Leg - Right 0[]  1[]  2[]  3[]  4[]  UN[]    7 Limb Ataxia 0[]  1[]  2[]  UN[]      8 Sensory 0[]  1[]  2[]  UN[]      9 Best Language 0[]  1[]  2[]  3[]      10 Dysarthria 0[]  1[x]  2[]  UN[]      11 Extinct. and Inattention 0[]  1[]  2[]       TOTAL: 1       Past History   Past Medical History:  Diagnosis Date   Hypertension    Medical history non-contributory    Scoliosis     Past Surgical History:  Procedure Laterality Date   NO PAST SURGERIES      Family History: History reviewed. No pertinent family history.  Social History  reports that he has been smoking  cigarettes. He has never used smokeless tobacco. He reports current alcohol use of about 1.0 standard drink of alcohol per week. He reports current drug use. Frequency: 1.00 time per week. Drug: Marijuana.  No Known Allergies  Medications   Current Facility-Administered Medications:     stroke: early stages of recovery book, , Does not apply, Once, Fred Fred [name], Fred A, MD   0.9 %  sodium chloride  infusion, , Intravenous, Continuous, Fred Fred [name], Fred A, MD, Last Rate: 40 mL/hr at 08/13/24 9367, Infusion Verify at 08/13/24 9367   acetaminophen  (TYLENOL ) tablet 650 mg, 650 mg, Oral, Q4H PRN, 650 mg at 08/12/24 2356 **OR** acetaminophen  (TYLENOL ) 160 MG/5ML solution 650 mg, 650 mg, Per Tube, Q4H PRN **OR** acetaminophen  (TYLENOL ) suppository 650 mg, 650 mg, Rectal, Q4H PRN, Fred Fred [name], Fred A, MD   amLODipine  (NORVASC ) tablet 5 mg, 5 mg, Oral, Daily, Fred Fred [name], Fred A, MD, 5 mg at 08/13/24 0944   enoxaparin  (LOVENOX ) injection 40 mg, 40 mg, Subcutaneous, Q24H, Dail Rankin RAMAN, RPH, 40 mg at 08/13/24 0954   folic acid  (FOLVITE )  tablet 1 mg, 1 mg, Oral, Daily, Fred Fred [name], Fred A, MD, 1 mg at 08/13/24 0944   hydrALAZINE  (APRESOLINE ) injection 10 mg, 10 mg, Intravenous, Q6H PRN, Fred Fred [name], Fred A, MD, 10 mg at 08/13/24 9940   labetalol  (NORMODYNE ) injection 20 mg, 20 mg, Intravenous, Q3H PRN, Fred Fred [name], Fred A, MD   lisinopril  (ZESTRIL ) tablet 10 mg, 10 mg, Oral, Daily, Fred Fred [name], Fred A, MD, 10 mg at 08/13/24 0944   LORazepam  (ATIVAN ) tablet 1-4 mg, 1-4 mg, Oral, Q1H PRN **OR** LORazepam  (ATIVAN ) injection 1-4 mg, 1-4 mg, Intravenous, Q1H PRN, Fred Fred [name], Fred A, MD   multivitamin with minerals tablet 1 tablet, 1 tablet, Oral, Daily, Fred Fred [name], Fred A, MD, 1 tablet at 08/13/24 0944   senna-docusate (Senokot-S) tablet 1 tablet, 1 tablet, Oral, QHS PRN, Fred Fred [name], Fred A, MD   thiamine  (VITAMIN B1) tablet 100 mg, 100 mg, Oral, Daily, 100 mg at 08/13/24 0943 **OR** thiamine  (VITAMIN B1) injection 100 mg, 100 mg, Intravenous, Daily, Fred Fred [name], Fred A, MD  Current  Outpatient Medications:    amLODipine  (NORVASC ) 5 MG tablet, Take 1 tablet (5 mg total) by mouth daily. (Patient not taking: Reported on 22-Aug-2024), Disp: 30 tablet, Rfl: 1   lisinopril  (ZESTRIL ) 10 MG tablet, Take 1 tablet (10 mg total) by mouth daily. (Patient not taking: Reported on 2024-08-22), Disp: 30 tablet, Rfl: 1   naltrexone  (DEPADE) 50 MG tablet, Take 1 tablet (50 mg total) by mouth daily. (Patient not taking: Reported on 12/03/2022), Disp: 30 tablet, Rfl: 1   thiamine  100 MG tablet, Take 1 tablet (100 mg total) by mouth daily. (Patient not taking: Reported on 08/22/24), Disp: 30 tablet, Rfl: 1  Vitals   Vitals:   08/13/24 0559 08/13/24 0600 08/13/24 0944 08/13/24 1220  BP:  (!) 138/99 (!) 143/90 (!) 145/89  Pulse:  82 96 89  Resp:  (!) 27 (!) 22 (!) 25  Temp: 98.5 F (36.9 C)     TempSrc:      SpO2:  99% 100% 100%  Weight:      Height:        Body mass index is 31.25 kg/m.   Physical Exam   Constitutional: Appears well-developed and well-nourished.   Neurologic Examination    Neuro: Mental Status: Patient is awake, alert, oriented to person, place, month, year, and situation. Patient is able to give Fred [name] clear and coherent history. No signs of aphasia or neglect Cranial Nerves: II: Visual Fields are full. Pupils are equal, round, and reactive to light.   III,IV, VI: EOMI without ptosis or diploplia.  V: Facial sensation is symmetric to temperature VII: Facial movement is symmetric.  VIII: hearing is intact to voice X: Uvula elevates symmetrically XII: tongue is midline without atrophy or fasciculations.  Motor: Tone is normal. Bulk is normal. 5/5 strength was present in all four extremities.  Sensory: Sensation is symmetric to light touch and temperature in the arms and legs. Cerebellar: FNF and HKS are intact bilaterally        Labs/Imaging/Neurodiagnostic studies   CBC:  Recent Labs  Lab 08/22/24 2146  WBC 7.6  NEUTROABS 4.8  HGB 11.4*  HCT  37.9*  MCV 75.2*  PLT 316   Basic Metabolic Panel:  Lab Results  Component Value Date   NA 139 2024/08/22   K 3.5 Aug 22, 2024   CO2 24 08/22/2024   GLUCOSE 101 (H) 2024-08-22   BUN 14 2024/08/22   CREATININE 1.04 2024-08-22   CALCIUM  9.2 08/22/2024   GFRNONAA >60 08/22/2024   GFRAA >  60 12/14/2018   Lipid Panel:  Lab Results  Component Value Date   LDLCALC 110 (H) 08/13/2024   HgbA1c:  Lab Results  Component Value Date   HGBA1C 5.8 (H) 12/03/2022   Urine Drug Screen:     Component Value Date/Time   LABOPIA NONE DETECTED 08/13/2024 0625   LABOPIA NONE DETECTED 02/14/2013 0223   COCAINSCRNUR POSITIVE (Fred [name]) 08/13/2024 0625   LABBENZ NONE DETECTED 08/13/2024 0625   LABBENZ NONE DETECTED 02/14/2013 0223   AMPHETMU NONE DETECTED 08/13/2024 0625   AMPHETMU NONE DETECTED 02/14/2013 0223   THCU POSITIVE (Fred [name]) 08/13/2024 0625   THCU POSITIVE (Fred [name]) 02/14/2013 0223   LABBARB NONE DETECTED 08/13/2024 0625   LABBARB NONE DETECTED 02/14/2013 0223    Alcohol Level     Component Value Date/Time   ETH 37 (H) 08/12/2024 2146   INR  Lab Results  Component Value Date   INR 0.9 08/12/2024   APTT  Lab Results  Component Value Date   APTT 29 08/12/2024   MRI reviewed and is negative Echo with global hypokinesis, but no definite embolic source  ASSESSMENT   Keneth Borg is Fred [name] 48 y.o. male with Fred [name] history of hypertension and hyperlipidemia who presents with transient severe dysarthria.  It is almost completely resolved, and I would favor treating this as Fred [name] TIA.  RECOMMENDATIONS  Aspirin  81 mg daily and Plavix  75 mg daily for 3 weeks followed by aspirin  monotherapy Atorvastatin  40 mg daily CTA head and neck Management of low EF per IM PT, OT, ST ______________________________________________________________________    Signed, Aisha Seals, MD [facility]

## 2024-08-13 NOTE — ED Notes (Signed)
 Patient transported to MRI

## 2024-08-13 NOTE — Evaluation (Signed)
 Physical Therapy Evaluation Patient Details Name: Fred [name] MRN: [id number] DOB: March 31, 1976 Today's Date: 08/13/2024  History of Present Illness  48 y/o male presented to ED on 08/12/24 for HTN. Admitted for HTN urgency and TIA. PMH: HTN with noncompliance, alcohol abuse  Clinical Impression  Patient admitted with the above. PTA, patient lives with girlfriend and was independent with no AD. Patient continues to function at independent level although patient reports feeling a little slower than usual which he attributes to being tired. No weakness, coordination, or balance deficits appreciated. No further skilled PT needs identified acutely. No PT follow up recommended at this time. PT will complete orders at this time.         Equipment Recommendations None recommended by PT  Recommendations for Other Services       Functional Status Assessment Patient has not had a recent decline in their functional status     Precautions / Restrictions Precautions Precautions: None Recall of Precautions/Restrictions: Intact Restrictions Weight Bearing Restrictions Per Provider Order: No      Mobility  Bed Mobility Overal bed mobility: Independent                  Transfers Overall transfer level: Independent Equipment used: None                    Ambulation/Gait Ambulation/Gait assistance: Independent Gait Distance (Feet): 150 Feet Assistive device: None Gait Pattern/deviations: WFL(Within Functional Limits)   Gait velocity interpretation: >4.37 ft/sec, indicative of normal walking speed      Stairs            Wheelchair Mobility     Tilt Bed    Modified Rankin (Stroke Patients Only)       Balance Overall balance assessment: Independent                                           Pertinent Vitals/Pain Pain Assessment Pain Assessment: No/denies pain    Home Living Family/patient expects to be discharged to:: Private  residence Living Arrangements: Spouse/significant other   Type of Home: Apartment Home Access: Level entry       Home Layout: One level Home Equipment: None      Prior Function Prior Level of Function : Independent/Modified Independent                     Extremity/Trunk Assessment   Upper Extremity Assessment Upper Extremity Assessment: Overall WFL for tasks assessed    Lower Extremity Assessment Lower Extremity Assessment: Overall WFL for tasks assessed    Cervical / Trunk Assessment Cervical / Trunk Assessment: Normal  Communication   Communication Communication: No apparent difficulties    Cognition Arousal: Alert Behavior During Therapy: WFL for tasks assessed/performed   PT - Cognitive impairments: No apparent impairments                         Following commands: Intact       Cueing       General Comments      Exercises     Assessment/Plan    PT Assessment Patient does not need any further PT services  PT Problem List         PT Treatment Interventions      PT Goals (Current goals can be found in the Care Plan  section)  Acute Rehab PT Goals Patient Stated Goal: to get some sleep PT Goal Formulation: All assessment and education complete, DC therapy    Frequency       Co-evaluation               AM-PAC PT 6 Clicks Mobility  Outcome Measure Help needed turning from your back to your side while in a flat bed without using bedrails?: None Help needed moving from lying on your back to sitting on the side of a flat bed without using bedrails?: None Help needed moving to and from a bed to a chair (including a wheelchair)?: None Help needed standing up from a chair using your arms (e.g., wheelchair or bedside chair)?: None Help needed to walk in hospital room?: None Help needed climbing 3-5 steps with a railing? : None 6 Click Score: 24    End of Session   Activity Tolerance: Patient tolerated treatment  well Patient left: in bed;with call bell/phone within reach Nurse Communication: Mobility status PT Visit Diagnosis: Muscle weakness (generalized) (M62.81)    Time: 9140-9089 PT Time Calculation (min) (ACUTE ONLY): 11 min   Charges:   PT Evaluation $PT Eval Low Complexity: 1 Low   PT General Charges $$ ACUTE PT VISIT: 1 Visit         Maryanne Finder, PT, DPT Physical Therapist - [facility]  [facility]   Shantese Raven A Arlie Posch 08/13/2024, 9:53 AM

## 2024-08-13 NOTE — Progress Notes (Signed)
 SLP Cancellation Note  Patient Details Name: Fred [name] MRN: [id number] DOB: 12-15-1975   Cancelled treatment:       Reason Eval/Treat Not Completed: SLP screened, no needs identified, will sign off (chart reviewed; consulted NSG and met w/ pt in ED room.)  Pt is a 47 y/o male presented to ED on 08/12/24 for HTN. Admitted for concerns of HTN urgency and TIA. PMH: HTN with noncompliance, alcohol/ETOH abuse.   Pt denied any difficulty swallowing and is currently on a regular diet; tolerates swallowing pills w/ water per NSG. He has been drinking liquids but stated not hungry, I'd rather sleep when asked about meals. NSG reported no difficulties. Pt conversed in conversation answering general questions and indicating wants/needs for few things including [place] w/out expressive/receptive deficits noted; pt denied any speech-language deficits. Speech intelligible. No further skilled ST services indicated as pt appears at his baseline. Pt agreed. NSG to reconsult if any change in status while admitted.      [name], MS, CCC-SLP Speech Language Pathologist Rehab Services; [facility] [phone] (ascom) [name] 08/13/2024, 2:31 PM

## 2024-08-13 NOTE — TOC Initial Note (Signed)
 Transition of Care [facility]) - Initial/Assessment Note    Patient Details  Name: Fred [name] MRN: [id number] Date of Birth: 1976/10/02  Transition of [facility] CM/SW Contact:    Fred JAYSON Carpen, LCSW Phone Number: 08/13/2024, 11:51 AM  Clinical Narrative: CSW met with patient. No family at bedside. CSW introduced role and inquired about interest in SA resources. Patietn declined. CSW encouraged him to notify team if he changes his mind. Patient confirmed he does not have a PCP. CSW encouraged him to call his Medicaid worker once he returned home so they can assign him to a provider. No further concerns. CSW will continue to follow patient for support and facilitate return home when stable. When asked if he would have a ride home at discharge he replied, I hope so.                 Expected Discharge Plan: Home/Self Care Barriers to Discharge: Continued Medical Work up   Patient Goals and CMS Choice            Expected Discharge Plan and Services     Post Acute Care Choice: NA Living arrangements for the past 2 months: Apartment                                      Prior Living Arrangements/Services Living arrangements for the past 2 months: Apartment Lives with:: Significant Other Patient language and need for interpreter reviewed:: Yes Do you feel safe going back to the place where you live?: Yes      Need for Family Participation in Patient Care: Yes (Comment)     Criminal Activity/Legal Involvement Pertinent to Current Situation/Hospitalization: No - Comment as needed  Activities of Daily Living      Permission Sought/Granted                  Emotional Assessment Appearance:: Appears stated age Attitude/Demeanor/Rapport: Engaged, Lethargic Affect (typically observed): Calm Orientation: : Oriented to Self, Oriented to Place, Oriented to  Time, Oriented to Situation Alcohol / Substance Use: Alcohol Use Psych Involvement: No  (comment)  Admission diagnosis:  Hypertensive urgency [I16.0] Patient Active Problem List   Diagnosis Date Noted   Hypertensive urgency 08/12/2024   TIA (transient ischemic attack) 08/12/2024   Postural dizziness with presyncope 12/03/2022   Laceration of left hand 12/03/2022   Hypotension 12/03/2022   Alcohol use disorder 12/03/2022   SIRS (systemic inflammatory response syndrome) (HCC) 12/03/2022   Hematochezia 12/03/2022   AKI (acute kidney injury) (HCC) 12/03/2022   Elevated blood sugar 12/03/2022   COVID-19 virus infection 12/03/2022   Elevated troponin 12/03/2022   ABLA (acute blood loss anemia) 12/03/2022   Suicidal ideation 12/14/2018   Alcohol abuse 12/14/2018   Hypertension 12/14/2018   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 ([address]), [place] - [address] [place]) [place] [address] Phone: [phone] Fax: [phone]     Social Drivers of Health (SDOH) Social History: SDOH Screenings   Alcohol Screen: Low Risk  (12/14/2018)  Tobacco Use: High Risk (08/12/2024)   SDOH Interventions:     Readmission Risk Interventions     No data to display

## 2024-08-13 NOTE — Progress Notes (Signed)
 Progress Note   Patient: Fred [name] FMW:[id number] DOB: [birth year] DOA: 08/12/2024     0 DOS: the patient was seen and examined on 08/13/2024   Brief hospital course: Jamone Garrido is a 48 y.o. African-American male with medical history significant for essential hypertension with noncompliance to medications for 5 years, and likely alcohol abuse, who presented to the emergency room with acute onset of lightheadedness yesterday afternoon with dysarthria and mild headache.  He felt better now and is dysarthria resolved.  Neurology following CTA head and neck as well as echo pending.  Assessment and Plan:  Hypertensive urgency secondary to medication noncompliance Patient restarted on his home medications Counseled on alcohol cessation Monitor blood pressure closely   TIA (transient ischemic attack) Plan of care discussed with neurologist Neurologist believes this is likely a TIA and patient will require dual antiplatelet therapy as well as statin therapy CTA head and neck requested and pending Echocardiogram done results pending Continue PT OT Hemoglobin A1c results pending     DVT prophylaxis: Lovenox .   Advanced Care Planning:  Code Status: full code.   Physical Exam:  GENERAL:  48 y.o.-year-old African-American male patient lying in the bed with no acute distress.  EYES: Pupils equal, round, reactive to light and accommodation. No scleral icterus. Extraocular muscles intact.  HEENT: Head atraumatic, normocephalic. Oropharynx and nasopharynx clear.  NECK:  Supple, no jugular venous distention. No thyroid enlargement, no tenderness.  LUNGS: Normal breath sounds bilaterally, no wheezing, rales,rhonchi or crepitation. No use of accessory muscles of respiration.  CARDIOVASCULAR: Regular rate and rhythm, S1, S2 normal. No murmurs, rubs, or gallops.  ABDOMEN: Soft, nondistended, nontender. Bowel sounds present. No organomegaly or mass.  EXTREMITIES: No pedal edema,  cyanosis, or clubbing.  NEUROLOGIC: Cranial nerves II through XII are intact. Muscle strength 5/5 in all extremities. Sensation intact. Gait not checked.  PSYCHIATRIC: The patient is alert and oriented x 3.  Normal affect and good eye contact. SKIN: No obvious rash, lesion, or ulcer. Vitals:   08/13/24 0944 08/13/24 1220 08/13/24 1317 08/13/24 1522  BP: (!) 143/90 (!) 145/89 (!) 137/96 (!) 140/101  Pulse: 96 89 79 73  Resp: (!) 22 (!) 25 18 18   Temp:   98 F (36.7 C) 98.1 F (36.7 C)  TempSrc:      SpO2: 100% 100% 100% 100%  Weight:      Height:        Data Reviewed: MRI of the brain reviewed that did not show any acute pathology    Latest Ref Rng & Units 08/12/2024    9:46 PM 12/03/2022    4:32 AM 12/02/2022    9:48 PM  CBC  WBC 4.0 - 10.5 K/uL 7.6   8.1   Hemoglobin 13.0 - 17.0 g/dL 88.5  9.6  88.1   Hematocrit 39.0 - 52.0 % 37.9   37.3   Platelets 150 - 400 K/uL 316   262        Latest Ref Rng & Units 08/12/2024    9:46 PM 12/02/2022    9:48 PM 10/29/2020   10:08 PM  BMP  Glucose 70 - 99 mg/dL 898  826  887   BUN 6 - 20 mg/dL 14  38  12   Creatinine 0.61 - 1.24 mg/dL 8.95  8.48  8.89   Sodium 135 - 145 mmol/L 139  139  140   Potassium 3.5 - 5.1 mmol/L 3.5  4.2  3.4   Chloride 98 - 111  mmol/L 102  107  106   CO2 22 - 32 mmol/L 24  21  22    Calcium  8.9 - 10.3 mg/dL 9.2  8.6  8.9    Family Communication: No family at bedside  Disposition: Hopefully home  Time spent: 52 minutes  Author: Drue ONEIDA Potter, MD 08/13/2024 5:03 PM  For on call review [email or web address].

## 2024-08-14 ENCOUNTER — Telehealth (HOSPITAL_COMMUNITY): Payer: Self-pay | Admitting: Pharmacy Technician

## 2024-08-14 ENCOUNTER — Other Ambulatory Visit (HOSPITAL_COMMUNITY): Payer: Self-pay

## 2024-08-14 ENCOUNTER — Observation Stay

## 2024-08-14 DIAGNOSIS — Z91148 Patient's other noncompliance with medication regimen for other reason: Secondary | ICD-10-CM

## 2024-08-14 DIAGNOSIS — K2970 Gastritis, unspecified, without bleeding: Secondary | ICD-10-CM | POA: Diagnosis present

## 2024-08-14 DIAGNOSIS — F1210 Cannabis abuse, uncomplicated: Secondary | ICD-10-CM | POA: Diagnosis present

## 2024-08-14 DIAGNOSIS — D6832 Hemorrhagic disorder due to extrinsic circulating anticoagulants: Secondary | ICD-10-CM | POA: Diagnosis present

## 2024-08-14 DIAGNOSIS — K2971 Gastritis, unspecified, with bleeding: Secondary | ICD-10-CM

## 2024-08-14 DIAGNOSIS — G459 Transient cerebral ischemic attack, unspecified: Secondary | ICD-10-CM | POA: Diagnosis present

## 2024-08-14 DIAGNOSIS — K3189 Other diseases of stomach and duodenum: Secondary | ICD-10-CM | POA: Diagnosis present

## 2024-08-14 DIAGNOSIS — R471 Dysarthria and anarthria: Secondary | ICD-10-CM | POA: Diagnosis present

## 2024-08-14 DIAGNOSIS — K922 Gastrointestinal hemorrhage, unspecified: Secondary | ICD-10-CM

## 2024-08-14 DIAGNOSIS — I420 Dilated cardiomyopathy: Secondary | ICD-10-CM | POA: Diagnosis present

## 2024-08-14 DIAGNOSIS — R7989 Other specified abnormal findings of blood chemistry: Secondary | ICD-10-CM | POA: Diagnosis present

## 2024-08-14 DIAGNOSIS — Z8616 Personal history of COVID-19: Secondary | ICD-10-CM | POA: Diagnosis not present

## 2024-08-14 DIAGNOSIS — R2990 Unspecified symptoms and signs involving the nervous system: Secondary | ICD-10-CM

## 2024-08-14 DIAGNOSIS — K921 Melena: Secondary | ICD-10-CM | POA: Diagnosis present

## 2024-08-14 DIAGNOSIS — Y901 Blood alcohol level of 20-39 mg/100 ml: Secondary | ICD-10-CM | POA: Diagnosis present

## 2024-08-14 DIAGNOSIS — K264 Chronic or unspecified duodenal ulcer with hemorrhage: Secondary | ICD-10-CM | POA: Diagnosis present

## 2024-08-14 DIAGNOSIS — I160 Hypertensive urgency: Secondary | ICD-10-CM | POA: Diagnosis present

## 2024-08-14 DIAGNOSIS — I5021 Acute systolic (congestive) heart failure: Secondary | ICD-10-CM | POA: Diagnosis present

## 2024-08-14 DIAGNOSIS — T45515A Adverse effect of anticoagulants, initial encounter: Secondary | ICD-10-CM | POA: Diagnosis present

## 2024-08-14 DIAGNOSIS — F17210 Nicotine dependence, cigarettes, uncomplicated: Secondary | ICD-10-CM | POA: Diagnosis present

## 2024-08-14 DIAGNOSIS — Z01818 Encounter for other preprocedural examination: Secondary | ICD-10-CM

## 2024-08-14 DIAGNOSIS — F1010 Alcohol abuse, uncomplicated: Secondary | ICD-10-CM | POA: Diagnosis present

## 2024-08-14 DIAGNOSIS — T464X6A Underdosing of angiotensin-converting-enzyme inhibitors, initial encounter: Secondary | ICD-10-CM | POA: Diagnosis present

## 2024-08-14 DIAGNOSIS — I429 Cardiomyopathy, unspecified: Secondary | ICD-10-CM

## 2024-08-14 DIAGNOSIS — I5020 Unspecified systolic (congestive) heart failure: Secondary | ICD-10-CM

## 2024-08-14 DIAGNOSIS — F1910 Other psychoactive substance abuse, uncomplicated: Secondary | ICD-10-CM | POA: Diagnosis not present

## 2024-08-14 DIAGNOSIS — T452X6A Underdosing of vitamins, initial encounter: Secondary | ICD-10-CM | POA: Diagnosis present

## 2024-08-14 DIAGNOSIS — D62 Acute posthemorrhagic anemia: Secondary | ICD-10-CM | POA: Diagnosis present

## 2024-08-14 DIAGNOSIS — I110 Hypertensive heart disease with heart failure: Secondary | ICD-10-CM | POA: Diagnosis present

## 2024-08-14 DIAGNOSIS — I959 Hypotension, unspecified: Secondary | ICD-10-CM | POA: Diagnosis present

## 2024-08-14 DIAGNOSIS — Z7902 Long term (current) use of antithrombotics/antiplatelets: Secondary | ICD-10-CM | POA: Diagnosis not present

## 2024-08-14 DIAGNOSIS — E785 Hyperlipidemia, unspecified: Secondary | ICD-10-CM | POA: Diagnosis present

## 2024-08-14 DIAGNOSIS — T461X6A Underdosing of calcium-channel blockers, initial encounter: Secondary | ICD-10-CM | POA: Diagnosis present

## 2024-08-14 DIAGNOSIS — F1410 Cocaine abuse, uncomplicated: Secondary | ICD-10-CM | POA: Diagnosis present

## 2024-08-14 LAB — BASIC METABOLIC PANEL WITH GFR
Anion gap: 8 (ref 5–15)
BUN: 29 mg/dL — ABNORMAL HIGH (ref 6–20)
CO2: 24 mmol/L (ref 22–32)
Calcium: 8.3 mg/dL — ABNORMAL LOW (ref 8.9–10.3)
Chloride: 107 mmol/L (ref 98–111)
Creatinine, Ser: 1.17 mg/dL (ref 0.61–1.24)
GFR, Estimated: >60 mL/min (ref >60)
Glucose, Bld: 114 mg/dL — ABNORMAL HIGH (ref 70–99)
Potassium: 4 mmol/L (ref 3.5–5.1)
Sodium: 139 mmol/L (ref 135–145)

## 2024-08-14 LAB — CBC WITH DIFFERENTIAL/PLATELET
Abs Immature Granulocytes: 0.06 K/uL (ref 0.00–0.07)
Basophils Absolute: 0 K/uL (ref 0.0–0.1)
Basophils Relative: 0 %
Eosinophils Absolute: 0.1 K/uL (ref 0.0–0.5)
Eosinophils Relative: 1 %
HCT: 28.6 % — ABNORMAL LOW (ref 39.0–52.0)
Hemoglobin: 8.6 g/dL — ABNORMAL LOW (ref 13.0–17.0)
Immature Granulocytes: 1 %
Lymphocytes Relative: 15 %
Lymphs Abs: 1.7 K/uL (ref 0.7–4.0)
MCH: 23.5 pg — ABNORMAL LOW (ref 26.0–34.0)
MCHC: 30.1 g/dL (ref 30.0–36.0)
MCV: 78.1 fL — ABNORMAL LOW (ref 80.0–100.0)
Monocytes Absolute: 0.9 K/uL (ref 0.1–1.0)
Monocytes Relative: 8 %
Neutro Abs: 8.7 K/uL — ABNORMAL HIGH (ref 1.7–7.7)
Neutrophils Relative %: 75 %
Platelets: 247 K/uL (ref 150–400)
RBC: 3.66 MIL/uL — ABNORMAL LOW (ref 4.22–5.81)
RDW: 19.1 % — ABNORMAL HIGH (ref 11.5–15.5)
WBC: 11.5 K/uL — ABNORMAL HIGH (ref 4.0–10.5)
nRBC: 0 % (ref 0.0–0.2)

## 2024-08-14 LAB — HEMOGLOBIN AND HEMATOCRIT, BLOOD
HCT: 30.7 % — ABNORMAL LOW (ref 39.0–52.0)
Hemoglobin: 9.9 g/dL — ABNORMAL LOW (ref 13.0–17.0)

## 2024-08-14 LAB — APTT: aPTT: 22 s — ABNORMAL LOW (ref 24–36)

## 2024-08-14 LAB — PREPARE RBC (CROSSMATCH)

## 2024-08-14 LAB — MRSA NEXT GEN BY PCR, NASAL: MRSA by PCR Next Gen: NOT DETECTED

## 2024-08-14 MED ORDER — CARVEDILOL 6.25 MG PO TABS
6.2500 mg | ORAL_TABLET | Freq: Two times a day (BID) | ORAL | Status: DC
  Administered 2024-08-14: 6.25 mg via ORAL
  Filled 2024-08-14: qty 1

## 2024-08-14 MED ORDER — SODIUM CHLORIDE 0.9% IV SOLUTION: Freq: Once | INTRAVENOUS | Status: AC

## 2024-08-14 MED ORDER — LOSARTAN POTASSIUM 50 MG PO TABS: 50.0000 mg | ORAL_TABLET | Freq: Every day | ORAL | Status: DC

## 2024-08-14 MED ORDER — IOHEXOL 350 MG/ML SOLN
100.0000 mL | Freq: Once | INTRAVENOUS | Status: AC | PRN
  Administered 2024-08-14: 100 mL via INTRAVENOUS

## 2024-08-14 MED ORDER — SODIUM CHLORIDE 0.9 % IV SOLN: INTRAVENOUS | Status: AC

## 2024-08-14 MED ORDER — CARVEDILOL 12.5 MG PO TABS
12.5000 mg | ORAL_TABLET | Freq: Two times a day (BID) | ORAL | Status: DC
  Administered 2024-08-14 – 2024-08-16 (×4): 12.5 mg via ORAL
  Filled 2024-08-14 (×4): qty 1

## 2024-08-14 NOTE — Progress Notes (Signed)
   08/14/24 0000  Spiritual Encounters  Type of Visit Initial  Care provided to: Pt not available  Conversation partners present during encounter Nurse  Referral source Code page  Reason for visit Code  OnCall Visit Yes   Responded to a rapid response. Patient was not available. Medical team worked with patient and eventually moved patient to another room.

## 2024-08-14 NOTE — Progress Notes (Signed)
 Heart Failure Navigator Progress Note  Visited patient today due to New CHF diagnosis. He was resting with eyes closed but did communicate with me.  He was in agreement for a return visit tomorrow to review Heart Failure education and set up a TOC appointment.   Navigator available for reassessment of patient and will return tomorrow.  Charmaine Pines, RN, BSN [facility] Heart Failure Navigator Secure Chat Only

## 2024-08-14 NOTE — Assessment & Plan Note (Signed)
 No chest pain or complaint of dyspnea.  Echocardiogram with EF of 30 to 35% and grade 1 diastolic dysfunction, global hypokinesis. Bubble study was negative. Troponin was not done as there was no concern on admission. Cardiology was consulted-they will do ischemic workup with cardiac cath as outpatient due to concern of GI bleeding now. -Low-dose carvedilol  was added -Add GDMT as tolerated

## 2024-08-14 NOTE — Telephone Encounter (Signed)
 Pharmacy Patient Advocate Encounter   Received notification from Inpatient Request that prior authorization for Farxiga  10 mg tablets is required/requested.   Insurance verification completed.   The patient is insured through [facility] MEDICAID .   Per test claim: PA required; PA submitted to above mentioned insurance via Latent Key/confirmation #/EOC [id number] Status is pending

## 2024-08-14 NOTE — Assessment & Plan Note (Signed)
 Patient overnight had a large melanotic stool and was found on floor surrounded by it.  Hemoglobin decreased to 8.9 from 11.4.  No more bleeding. GI was consulted-going for EGD and colonoscopy tomorrow morning. - Patient was started on IV Protonix  and octreotide  because of his history of alcohol abuse. -Monitor hemoglobin

## 2024-08-14 NOTE — Consult Note (Signed)
 Cardiology Consultation   Patient ID: Fred [name] MRN: [id number]; DOB: [birth year]  Admit date: 08/12/2024 Date of Consult: 08/14/2024  PCP:  Fred [name]    [facility] Providers Cardiologist: New   Patient Profile: Fred [name] is a 48 y.o. male with a hx of HTN, alcohol use who is being seen 08/14/2024 for the evaluation of low EF at the request of Dr. Caleen.  History of Present Illness: Mr. Fred [name] has not been seen by cardiology in the past.   The patient presented 08/12/24 for HTN. He was feeling lightheaded and dizzy with a headache. Reported transient dysarthria.   In the ER BP 145/98, pulse 86, RR 15, 99.42F. labs showed alcohol level 37, Hgb 11.4. HS trop 28. EKG shows NSR 85bpm. MRI brain nonacute. CTA head/neck with no acute abnormality. He was admitted.   On 08/13/24 he was found on the floor with melonotic dark stool and emesis all over the patient, room and bathroom. Patient was alert and oriented. BP dropped to 94/63, HR 101. IV NS bolus was started. Stat head CT showed no acute abnormalities. CT spine showed no acute findings. GIB scan showed no active extravasation identified. The patient was given IV NS and 2 units PRBCs, IV octreotide . GI was asked to see.   Echo bubble study showed LVEF 35-40%, mild LVH, G1DD. Cardiology was asked to see.   He drinks liquor daily.   Past Medical History:  Diagnosis Date   Hypertension    Medical history non-contributory    Scoliosis     Past Surgical History:  Procedure Laterality Date   NO PAST SURGERIES       Home Medications:  Prior to Admission medications   Medication Sig Start Date End Date Taking? Authorizing Provider  amLODipine  (NORVASC ) 5 MG tablet Take 1 tablet (5 mg total) by mouth daily. Patient not taking: Reported on 08/12/2024 11/11/20   Saunders Shona CROME, PA-C  lisinopril  (ZESTRIL ) 10 MG tablet Take 1 tablet (10 mg total) by mouth daily. Patient not taking: Reported on 08/12/2024 11/11/20    Saunders Shona CROME, PA-C  naltrexone  (DEPADE) 50 MG tablet Take 1 tablet (50 mg total) by mouth daily. Patient not taking: Reported on 12/03/2022 12/18/18   Clapacs, Norleen DASEN, MD  thiamine  100 MG tablet Take 1 tablet (100 mg total) by mouth daily. Patient not taking: Reported on 08/12/2024 12/18/18   Clapacs, Norleen DASEN, MD    Scheduled Meds:   stroke: early stages of recovery book   Does not apply Once   amLODipine   5 mg Oral Daily   aspirin  EC  81 mg Oral Daily   atorvastatin   40 mg Oral Daily   Chlorhexidine  Gluconate Cloth  6 each Topical QHS   clopidogrel   75 mg Oral Daily   enoxaparin  (LOVENOX ) injection  40 mg Subcutaneous Q24H   folic acid   1 mg Oral Daily   lisinopril   10 mg Oral Daily   multivitamin with minerals  1 tablet Oral Daily   pantoprazole  (PROTONIX ) IV  40 mg Intravenous Q12H   thiamine   100 mg Oral Daily   Or   thiamine   100 mg Intravenous Daily   Continuous Infusions:  sodium chloride  100 mL/hr at 08/14/24 0619   octreotide  (SANDOSTATIN ) 500 mcg in sodium chloride  0.9 % 250 mL (2 mcg/mL) infusion 50 mcg/hr (08/14/24 0901)   PRN Meds: acetaminophen  **OR** acetaminophen  (TYLENOL ) oral liquid 160 mg/5 mL **OR** acetaminophen , hydrALAZINE , labetalol , LORazepam  **OR** LORazepam , ondansetron  (ZOFRAN ) IV, senna-docusate  Allergies:   No Known Allergies  Social History:   Social History   Socioeconomic History   Marital status: Single    Spouse name: Not on file   Number of children: Not on file   Years of education: Not on file   Highest education level: Not on file  Occupational History   Not on file  Tobacco Use   Smoking status: Every Day    Types: Cigarettes   Smokeless tobacco: Never   Tobacco comments:    pt refused  Substance and Sexual Activity   Alcohol use: Yes    Alcohol/week: 1.0 standard drink of alcohol    Types: 1 Cans of beer per week   Drug use: Yes    Frequency: 1.0 times per week    Types: Marijuana   Sexual activity: Yes  Other Topics  Concern   Not on file  Social History Narrative   Not on file   Social Drivers of Health   Financial Resource Strain: Not on file  Food Insecurity: No Food Insecurity (08/13/2024)   Hunger Vital Sign    Worried About Running Out of Food in the Last Year: Never true    Ran Out of Food in the Last Year: Never true  Transportation Needs: No Transportation Needs (08/13/2024)   PRAPARE - Administrator, Civil Service (Medical): No    Lack of Transportation (Non-Medical): No  Physical Activity: Not on file  Stress: Not on file  Social Connections: Not on file  Intimate Partner Violence: Not At Risk (08/13/2024)   Humiliation, Afraid, Rape, and Kick questionnaire    Fear of Current or Ex-Partner: No    Emotionally Abused: No    Physically Abused: No    Sexually Abused: No    Family History:   History reviewed. No pertinent family history.   ROS:  Please see the history of present illness.   All other ROS reviewed and negative.     Physical Exam/Data: Vitals:   08/14/24 0730 08/14/24 0800 08/14/24 0830 08/14/24 0907  BP: (!) 139/93 (!) 156/95 (!) 158/87 (!) 181/103  Pulse: 76 80 77 89  Resp: 19 (!) 23 17 (!) 25  Temp:  97.8 F (36.6 C)    TempSrc:  Axillary    SpO2: 98% 99% 99% 97%  Weight:      Height:        Intake/Output Summary (Last 24 hours) at 08/14/2024 0908 Last data filed at 08/14/2024 0904 Gross per 24 hour  Intake 1541.04 ml  Output 1800 ml  Net -258.96 ml      08/13/2024   11:36 PM 08/12/2024    9:40 PM 11/11/2020   10:10 AM  Last 3 Weights  Weight (lbs) 157 lb 3 oz 160 lb 154 lb 15.7 oz  Weight (kg) 71.3 kg 72.576 kg 70.3 kg     Body mass index is 30.7 kg/m.  General:  Well nourished, well developed, in no acute distress HEENT: normal Neck: no JVD Vascular: No carotid bruits; Distal pulses 2+ bilaterally Cardiac:  normal S1, S2; RRR; no murmur  Lungs:  clear to auscultation bilaterally, no wheezing, rhonchi or rales  Abd: soft, nontender, no  hepatomegaly  Ext: no edema Musculoskeletal:  No deformities, BUE and BLE strength normal and equal Skin: warm and dry  Neuro:  CNs 2-12 intact, no focal abnormalities noted Psych:  Normal affect   EKG:  The EKG was personally reviewed and demonstrates:  NSR 85bpm, LVH with repol  Telemetry:  Telemetry was personally reviewed and demonstrates:  NSR 80s  Relevant CV Studies:  Echo 08/13/24  1. Left ventricular ejection fraction, by estimation, is 35 to 40%. Left  ventricular ejection fraction by 3D volume is 36 %. The left ventricle has  moderately decreased function. The left ventricle demonstrates global  hypokinesis. There is mild left  ventricular hypertrophy. Left ventricular diastolic parameters are  consistent with Grade I diastolic dysfunction (impaired relaxation).  Elevated left atrial pressure. The average left ventricular global  longitudinal strain is -4.4 %.   2. Right ventricular systolic function is normal. The right ventricular  size is normal. Tricuspid regurgitation signal is inadequate for assessing  PA pressure.   3. The mitral valve is normal in structure. No evidence of mitral valve  regurgitation. No evidence of mitral stenosis.   4. The aortic valve has an indeterminant number of cusps. Aortic valve  regurgitation is not visualized. No aortic stenosis is present.   5. Agitated saline contrast bubble study was negative, with no evidence  of any interatrial shunt.   Laboratory Data: High Sensitivity Troponin:   Recent Labs  Lab 08/12/24 2146  TROPONINIHS 28*     Chemistry Recent Labs  Lab 08/12/24 2146 08/13/24 2324 08/14/24 0452  NA 139 139 139  K 3.5 4.2 4.0  CL 102 104 107  CO2 24 23 24   GLUCOSE 101* 182* 114*  BUN 14 31* 29*  CREATININE 1.04 1.22 1.17  CALCIUM  9.2 8.4* 8.3*  GFRNONAA >60 >60 >60  ANIONGAP 13 12 8     Recent Labs  Lab 08/12/24 2146 08/13/24 2324  PROT 7.7 6.4*  ALBUMIN 4.1 3.4*  AST 23 22  ALT 13 11  ALKPHOS 92 62   BILITOT 0.5 0.6   Lipids  Recent Labs  Lab 08/13/24 0454  CHOL 202*  TRIG 42  HDL 84  LDLCALC 110*  CHOLHDL 2.4    Hematology Recent Labs  Lab 08/12/24 2146 08/13/24 2324 08/14/24 0452  WBC 7.6 12.5* 11.5*  RBC 5.04 3.87* 3.66*  HGB 11.4* 8.9* 8.6*  HCT 37.9* 29.8* 28.6*  MCV 75.2* 77.0* 78.1*  MCH 22.6* 23.0* 23.5*  MCHC 30.1 29.9* 30.1  RDW 20.1* 20.1* 19.1*  PLT 316 314 247   Thyroid No results for input(s): TSH, FREET4 in the last 168 hours.  BNPNo results for input(s): BNP, PROBNP in the last 168 hours.  DDimer No results for input(s): DDIMER in the last 168 hours.  Radiology/Studies:  CT ANGIO GI BLEED Result Date: 08/14/2024 CLINICAL DATA:  Abdominal trauma, blunt, hematochezia, found down EXAM: CTA ABDOMEN AND PELVIS WITHOUT AND WITH CONTRAST TECHNIQUE: Multidetector CT imaging of the abdomen and pelvis was performed using the standard protocol during bolus administration of intravenous contrast. Multiplanar reconstructed images and MIPs were obtained and reviewed to evaluate the vascular anatomy. RADIATION DOSE REDUCTION: This exam was performed according to the departmental dose-optimization program which includes automated exposure control, adjustment of the mA and/or kV according to patient size and/or use of iterative reconstruction technique. CONTRAST:  OMNIPAQUE  IOHEXOL  350 MG/ML SOLN COMPARISON:  None Available. FINDINGS: VASCULAR Aorta: Normal caliber aorta without aneurysm, dissection, vasculitis or significant stenosis. Mild atherosclerotic calcification Celiac: Patent without evidence of aneurysm, dissection, vasculitis or significant stenosis. SMA: Patent without evidence of aneurysm, dissection, vasculitis or significant stenosis. Renals: Both renal arteries are patent without evidence of aneurysm, dissection, vasculitis, fibromuscular dysplasia or significant stenosis. IMA: Patent without evidence of aneurysm, dissection, vasculitis or  significant stenosis.  Inflow: Patent without evidence of aneurysm, dissection, vasculitis or significant stenosis. Proximal Outflow: Bilateral common femoral and visualized portions of the superficial and profunda femoral arteries are patent without evidence of aneurysm, dissection, vasculitis or significant stenosis. Veins: Unremarkable Review of the MIP images confirms the above findings. NON-VASCULAR Lower chest: No acute abnormality. Global cardiac size within normal limits though left ventricular hypertrophy is noted. Hypoattenuation of the cardiac blood pool in keeping with at least mild anemia. Hepatobiliary: No focal liver abnormality is seen. No gallstones, gallbladder wall thickening, or biliary dilatation. Pancreas: Unremarkable Spleen: Unremarkable Adrenals/Urinary Tract: The adrenal glands are unremarkable. Tiny hypodensity within the interpolar region of the left kidney likely represent a tiny cortical cyst no follow-up imaging is recommended. The kidneys are otherwise unremarkable. There is mild distention of bladder and circumferential bladder wall thickening present suggesting changes of chronic bladder outlet obstruction. Stomach/Bowel: No active extravasation identified. Stomach, small bowel, and large bowel are unremarkable. Appendix normal. No free intraperitoneal gas or fluid. Lymphatic: No pathologic adenopathy within the abdomen pelvis. Reproductive: Marked prostatic hypertrophy Other: No abdominal wall hernia or abnormality. No abdominopelvic ascites. Musculoskeletal: No acute or significant osseous findings. IMPRESSION: 1. No active extravasation identified. 2. Left ventricular hypertrophy. 3. Mild anemia. 4. Marked prostatic hypertrophy with changes of chronic bladder outlet obstruction. 5. Aortic atherosclerosis. Aortic Atherosclerosis (ICD10-I70.0). Electronically Signed   By: Dorethia Molt M.D.   On: 08/14/2024 00:53   CT HEAD WO CONTRAST ( ) Result Date: 08/14/2024 EXAM: CT HEAD  AND CERVICAL SPINE 08/14/2024 12:35:52 AM TECHNIQUE: CT of the head and cervical spine was performed without the administration of intravenous contrast. Multiplanar reformatted images are provided for review. Automated exposure control, iterative reconstruction, and/or weight based adjustment of the mA/kV was utilized to reduce the radiation dose to as low as reasonably achievable. COMPARISON: None available. CLINICAL HISTORY: Syncope/presyncope, cerebrovascular cause suspected. Received call from telemetry patient leads off. Patient found down with bloody stools all over room and bathroom. Rapid response called. Patient stated he felt like he was going to pass out. Possibly lost consciousness. Unsure. 1000ml fluid bolus started. Patient transferred to ICU. FINDINGS: CT HEAD BRAIN AND VENTRICLES: No acute intracranial hemorrhage. No mass effect or midline shift. No abnormal extra-axial fluid collection. No evidence of acute infarct. No hydrocephalus. ORBITS: No acute abnormality. SINUSES AND MASTOIDS: Right maxillary sinus retention cyst. SOFT TISSUES AND SKULL: No acute skull fracture. No acute soft tissue abnormality. CT CERVICAL SPINE BONES AND ALIGNMENT: No acute fracture or traumatic malalignment. DEGENERATIVE CHANGES: No significant degenerative changes. SOFT TISSUES: No prevertebral soft tissue swelling. IMPRESSION: 1. No acute intracranial abnormality. 2. No acute fracture or traumatic malalignment of the cervical spine. Electronically signed by: Franky Stanford MD 08/14/2024 12:45 AM EDT RP Workstation: [id number]   CT CERVICAL SPINE WO CONTRAST Result Date: 08/14/2024 EXAM: CT HEAD AND CERVICAL SPINE 08/14/2024 12:35:52 AM TECHNIQUE: CT of the head and cervical spine was performed without the administration of intravenous contrast. Multiplanar reformatted images are provided for review. Automated exposure control, iterative reconstruction, and/or weight based adjustment of the mA/kV was utilized to reduce  the radiation dose to as low as reasonably achievable. COMPARISON: None available. CLINICAL HISTORY: Syncope/presyncope, cerebrovascular cause suspected. Received call from telemetry patient leads off. Patient found down with bloody stools all over room and bathroom. Rapid response called. Patient stated he felt like he was going to pass out. Possibly lost consciousness. Unsure. 1000ml fluid bolus started. Patient transferred to ICU. FINDINGS: CT HEAD BRAIN AND VENTRICLES:  No acute intracranial hemorrhage. No mass effect or midline shift. No abnormal extra-axial fluid collection. No evidence of acute infarct. No hydrocephalus. ORBITS: No acute abnormality. SINUSES AND MASTOIDS: Right maxillary sinus retention cyst. SOFT TISSUES AND SKULL: No acute skull fracture. No acute soft tissue abnormality. CT CERVICAL SPINE BONES AND ALIGNMENT: No acute fracture or traumatic malalignment. DEGENERATIVE CHANGES: No significant degenerative changes. SOFT TISSUES: No prevertebral soft tissue swelling. IMPRESSION: 1. No acute intracranial abnormality. 2. No acute fracture or traumatic malalignment of the cervical spine. Electronically signed by: Franky Stanford MD 08/14/2024 12:45 AM EDT RP Workstation: [id number]   ECHOCARDIOGRAM COMPLETE BUBBLE STUDY Result Date: 08/13/2024    ECHOCARDIOGRAM REPORT   Patient Name:   Fred [name] Date of Exam: 08/13/2024 Medical Rec #:  [id number]               Height:       60.0 in Accession #:    [id number]              Weight:       160.0 lb Date of Birth:  05/25/1976               BSA:          1.698 m Patient Age:    48 years                BP:           138/99 mmHg Patient Gender: M                       HR:           82 bpm. Exam Location:  [facility] Procedure: 2D Echo, Color Doppler, Cardiac Doppler, Saline Contrast Bubble            Study, Strain Analysis and 3D Echo (Both Spectral and Color Flow            Doppler were utilized during procedure). Indications:     TIA 435.9 / G45.9   History:         Patient has no prior history of Echocardiogram examinations.                  Risk Factors:Hypertension.  Sonographer:     Christopher Furnace Referring Phys:  [id number] JAN A MANSY Diagnosing Phys: Lonni Hanson MD  Sonographer Comments: Global longitudinal strain was attempted. IMPRESSIONS  1. Left ventricular ejection fraction, by estimation, is 35 to 40%. Left ventricular ejection fraction by 3D volume is 36 %. The left ventricle has moderately decreased function. The left ventricle demonstrates global hypokinesis. There is mild left ventricular hypertrophy. Left ventricular diastolic parameters are consistent with Grade I diastolic dysfunction (impaired relaxation). Elevated left atrial pressure. The average left ventricular global longitudinal strain is -4.4 %.  2. Right ventricular systolic function is normal. The right ventricular size is normal. Tricuspid regurgitation signal is inadequate for assessing PA pressure.  3. The mitral valve is normal in structure. No evidence of mitral valve regurgitation. No evidence of mitral stenosis.  4. The aortic valve has an indeterminant number of cusps. Aortic valve regurgitation is not visualized. No aortic stenosis is present.  5. Agitated saline contrast bubble study was negative, with no evidence of any interatrial shunt. FINDINGS  Left Ventricle: Left ventricular ejection fraction, by estimation, is 35 to 40%. Left ventricular ejection fraction by 3D volume is 36 %. The left ventricle has moderately decreased function. The left ventricle  demonstrates global hypokinesis. The average left ventricular global longitudinal strain is -4.4 %. The left ventricular internal cavity size was normal in size. There is mild left ventricular hypertrophy. Left ventricular diastolic parameters are consistent with Grade I diastolic dysfunction (impaired relaxation). Elevated left atrial pressure. Right Ventricle: The right ventricular size is normal. No increase in right  ventricular wall thickness. Right ventricular systolic function is normal. Tricuspid regurgitation signal is inadequate for assessing PA pressure. Left Atrium: Left atrial size was normal in size. Right Atrium: Right atrial size was normal in size. Pericardium: There is no evidence of pericardial effusion. Mitral Valve: The mitral valve is normal in structure. No evidence of mitral valve regurgitation. No evidence of mitral valve stenosis. MV peak gradient, 2.5 mmHg. The mean mitral valve gradient is 1.0 mmHg. Tricuspid Valve: The tricuspid valve is normal in structure. Tricuspid valve regurgitation is trivial. Aortic Valve: The aortic valve has an indeterminant number of cusps. Aortic valve regurgitation is not visualized. No aortic stenosis is present. Aortic valve mean gradient measures 2.5 mmHg. Aortic valve peak gradient measures 5.2 mmHg. Aortic valve area, by VTI measures 2.16 cm. Pulmonic Valve: The pulmonic valve was not well visualized. Pulmonic valve regurgitation is not visualized. No evidence of pulmonic stenosis. Aorta: The aortic root is normal in size and structure. Venous: The inferior vena cava was not well visualized. IAS/Shunts: The interatrial septum was not well visualized. Agitated saline contrast was given intravenously to evaluate for intracardiac shunting. Agitated saline contrast bubble study was negative, with no evidence of any interatrial shunt.  LEFT VENTRICLE PLAX 2D LVIDd:         5.00 cm         Diastology LVIDs:         3.80 cm         LV e' medial:    3.37 cm/s LV PW:         1.10 cm         LV E/e' medial:  15.3 LV IVS:        1.20 cm         LV e' lateral:   3.37 cm/s LVOT diam:     2.20 cm         LV E/e' lateral: 15.3 LV SV:         30 LV SV Index:   17              2D Longitudinal LVOT Area:     3.80 cm        Strain                                2D Strain GLS   -4.4 %                                Avg: LV Volumes (MOD) LV vol d, MOD    72.7 ml       3D Volume EF A2C:                            LV 3D EF:    Left LV vol d, MOD    93.2 ml                    ventricul A4C:  ar LV vol s, MOD    50.9 ml                    ejection A2C:                                        fraction LV vol s, MOD    62.9 ml                    by 3D A4C:                                        volume is LV SV MOD A2C:   21.8 ml                    36 %. LV SV MOD A4C:   93.2 ml LV SV MOD BP:    23.7 ml                                3D Volume EF:                                3D EF:        36 % RIGHT VENTRICLE RV Basal diam:  2.50 cm RV Mid diam:    1.80 cm RV S prime:     9.03 cm/s TAPSE (M-mode): 2.3 cm LEFT ATRIUM             Index        RIGHT ATRIUM           Index LA diam:        2.50 cm 1.47 cm/m   RA Area:     10.30 cm LA Vol (A2C):   24.8 ml 14.61 ml/m  RA Volume:   20.60 ml  12.13 ml/m LA Vol (A4C):   25.7 ml 15.14 ml/m LA Biplane Vol: 25.7 ml 15.14 ml/m  AORTIC VALVE AV Area (Vmax):    2.01 cm AV Area (Vmean):   2.06 cm AV Area (VTI):     2.16 cm AV Vmax:           113.50 cm/s AV Vmean:          76.000 cm/s AV VTI:            0.137 m AV Peak Grad:      5.2 mmHg AV Mean Grad:      2.5 mmHg LVOT Vmax:         59.90 cm/s LVOT Vmean:        41.100 cm/s LVOT VTI:          0.078 m LVOT/AV VTI ratio: 0.57  AORTA Ao Root diam: 3.60 cm MITRAL VALVE MV Area (PHT): 4.86 cm    SHUNTS MV Area VTI:   1.72 cm    Systemic VTI:  0.08 m MV Peak grad:  2.5 mmHg    Systemic Diam: 2.20 cm MV Mean grad:  1.0 mmHg MV Vmax:       0.80 m/s MV Vmean:      53.5 cm/s MV Decel Time: 156 msec MV E velocity: 51.40 cm/s MV A velocity:  67.10 cm/s MV E/A ratio:  0.77 Lonni Hanson MD Electronically signed by Lonni Hanson MD Signature Date/Time: 08/13/2024/6:46:39 PM    Final    CT ANGIO HEAD NECK W WO CM Result Date: 08/13/2024 EXAM: CTA Head and Neck with Intravenous Contrast. CT Head without Contrast. CLINICAL HISTORY: Stroke, follow up. Stroke, follow up. TECHNIQUE: Axial  CTA images of the head and neck performed with intravenous contrast. MIP reconstructed images were created and reviewed. Axial computed tomography images of the head/brain performed without intravenous contrast. Note: Per PQRS, the description of internal carotid artery percent stenosis, including 0 percent or normal exam, is based on [place] American Symptomatic Carotid Endarterectomy Trial (NASCET) criteria. Dose reduction technique was used including one or more of the following: automated exposure control, adjustment of mA and kV according to patient size, and/or iterative reconstruction. CONTRAST: 75 mL iohexol  (OMNIPAQUE ) 350 MG/ML injection. COMPARISON: None provided. FINDINGS: CT HEAD: BRAIN: No acute intraparenchymal hemorrhage. No mass lesion. No CT evidence for acute territorial infarct. No midline shift or extra-axial collection. Grey-white matter differentiation is maintained. No edema, mass effect, or midline shift. VENTRICLES: No hydrocephalus. ORBITS: The orbits are unremarkable. SINUSES AND MASTOIDS: Large mucous retention cyst in the right maxillary sinus. CTA NECK: COMMON CAROTID ARTERIES: Mixed atherosclerotic plaque at the right carotid bifurcation without hemodynamically significant stenosis. Additional mixed atherosclerotic plaque at the left carotid bifurcation without hemodynamically significant stenosis. INTERNAL CAROTID ARTERIES: No stenosis by NASCET criteria. No dissection or occlusion. VERTEBRAL ARTERIES: No significant stenosis. No dissection or occlusion. CTA HEAD: ANTERIOR CEREBRAL ARTERIES: No significant stenosis. No occlusion. No aneurysm. MIDDLE CEREBRAL ARTERIES: No significant stenosis. No occlusion. No aneurysm. POSTERIOR CEREBRAL ARTERIES: No significant stenosis. No occlusion. No aneurysm. BASILAR ARTERY: No significant stenosis. No occlusion. No aneurysm. OTHER: Mild atherosclerosis along the left cavernous ICA without significant stenosis. The intracranial internal carotid  artery is patent bilaterally. The middle cerebral arteries are patent bilaterally. The anterior cerebral arteries are patent bilaterally. SOFT TISSUES: No acute finding. No masses or lymphadenopathy. BONES: No acute osseous abnormality. Degenerative changes in the visualized spine greatest at C4-5. IMPRESSION: 1. No acute intracranial abnormality. 2. No large vessel occlusion. 3. Mixed atherosclerotic plaque at the carotid bifurcations without hemodynamically significant stenosis. 4. Mild atherosclerosis along the left cavernous ICA without significant stenosis. Electronically signed by: Donnice Mania MD 08/13/2024 05:07 PM EDT RP Workstation: [id number]   MR BRAIN WO CONTRAST Result Date: 08/13/2024 EXAM: MR Brain without Intravenous Contrast. CLINICAL HISTORY: Neuro deficit, acute, stroke suspected. Came in with untreated hypertension; patient states felt dizzy but does not know when it started. TECHNIQUE: Magnetic resonance images of the brain without intravenous contrast in multiple planes. CONTRAST: None. COMPARISON: None provided. FINDINGS: BRAIN: Minimal nonspecific white matter T2-weighted signal hyperintensities, which may be associated with early chronic small vessel disease or migraine headaches. No restricted diffusion to indicate acute infarction. No intracranial mass or hemorrhage. No midline shift or extra-axial fluid collection. No cerebellar tonsillar ectopia. The central arterial and venous flow voids are patent. VENTRICLES: No hydrocephalus. ORBITS: The orbits are normal. SINUSES AND MASTOIDS: Right maxillary sinus retention cyst. The remaining sinuses and mastoid air cells are clear. BONES: No acute fracture or focal osseous lesion. IMPRESSION: 1. No acute findings. 2. Minimal nonspecific white matter T2-weighted signal hyperintensities, possibly related to early chronic small vessel disease or migraine headaches. Electronically signed by: Franky Stanford MD 08/13/2024 12:34 AM EDT RP Workstation:  [id number]   CT HEAD WO CONTRAST Result Date: 08/12/2024 EXAM: CT HEAD  WITHOUT CONTRAST 08/12/2024 10:08:08 PM TECHNIQUE: CT of the head was performed without the administration of intravenous contrast. Automated exposure control, iterative reconstruction, and/or weight based adjustment of the mA/kV was utilized to reduce the radiation dose to as low as reasonably achievable. COMPARISON: 10/29/2020 CLINICAL HISTORY: Neuro deficit, acute, stroke suspected; dysarthria for 1 day, eval for stroke. BIB ems for hypertension, has not been taking for the last 6 years. Per pt I think I might have had a stroke yesterday. I had a headache, my BP was up, I was having trouble getting words out, and I was dizzy. Pt unsure of the time of onset. All symptoms have resolved except hypertension. FINDINGS: BRAIN AND VENTRICLES: No acute hemorrhage. No evidence of acute infarct. No hydrocephalus. No extra-axial collection. No mass effect or midline shift. ORBITS: No acute abnormality. SINUSES: No acute abnormality. SOFT TISSUES AND SKULL: No acute soft tissue abnormality. No skull fracture. IMPRESSION: 1. No acute intracranial abnormality. Electronically signed by: Franky Stanford MD 08/12/2024 10:29 PM EDT RP Workstation: [id number]     Assessment and Plan:  New HFrEF - admitted with TIA, hypertensive urgency and GIB found to have low EF - echo bubble study for stroke showed LVEF 35-40%, G1DD - no prior cardiac work-up - CM may be from hypertension vs alcohol - patient is not volume up on exam - start Coreg  6.25mg  BID - continue GDMT as able - not a good candidate for cath given anemia and noncompliance - may be able to cath as outpatient or consider Cardiac CTA as OP  Hypertensive urgency - apparently he was not taking medications at home - amlodipine  5mg  daily, Coreg  6.25mg BID, Losartan  50mg  daily - BP improved  GIB Cardiac pre-op evaluation - Hgb went down to 8.9>>9.9 - GI planning on colonoscopy and EGD -  patient is stable from a cardiac perspective. No further work-up prior to procedure.  - METs>4 - RCRI= 5% risk of MACE  Alcohol Abuse - CIWA per IM  Substance abuse - UDS showed cannabinoid and cocaine    For questions or updates, please contact [facility] Please consult [email or web address] for contact info under    Signed, Kamrin Spath VEAR Fishman, PA-C  08/14/2024 9:08 AM

## 2024-08-14 NOTE — Plan of Care (Signed)
  Problem: Ischemic Stroke/TIA Tissue Perfusion: Goal: Complications of ischemic stroke/TIA will be minimized Outcome: Progressing   Problem: Coping: Goal: Will identify appropriate support needs Outcome: Progressing   Problem: Health Behavior/Discharge Planning: Goal: Goals will be collaboratively established with patient/family Outcome: Progressing   Problem: Pain Managment: Goal: General experience of comfort will improve and/or be controlled Outcome: Progressing   Problem: Safety: Goal: Ability to remain free from injury will improve Outcome: Progressing   Problem: Skin Integrity: Goal: Risk for impaired skin integrity will decrease Outcome: Progressing

## 2024-08-14 NOTE — Progress Notes (Signed)
 Per Dr. Aundria pt can have clear liquid diet today and NPO after midnight. Plan to have a upper and lower endoscopy tomorrow.

## 2024-08-14 NOTE — Progress Notes (Signed)
 Progress Note   Patient: Fred [name] FMW:[id number] DOB: 1976-08-03 DOA: 08/12/2024     0 DOS: the patient was seen and examined on 08/14/2024   Brief hospital course: Partly taken from prior notes.  Fred [name] is a 48 y.o. African-American male with medical history significant for essential hypertension with noncompliance to medications for 5 years, and likely alcohol abuse, who presented to the emergency room with acute onset of lightheadedness yesterday afternoon with dysarthria and mild headache.   Patient was found to have elevated blood pressure on admission and was admitted for concern of TIA.  Symptoms resolved by the time of admission.  Neurology started on DAPT with aspirin  and Plavix .  9/3: Overnight patient was found on floor surrounded by large melanotic stool, he was hypotensive and tachycardic, was given a bolus with improvement in blood pressure.  Hemoglobin decreased to 8.9 from 11.4.  CTA GI bleed was negative for any active bleeding.  CT head and cervical spine was obtained to rule out any trauma and they were negative for any acute abnormalities.  GI was consulted, patient was started on IV Protonix  and octreotide  due to his history of alcohol abuse. Aspirin  and Plavix  are being held.  Going for EGD and colonoscopy tomorrow.  Also consulting cardiology for new diagnosis of HFrEF.  UDS was positive for cocaine and cannabinoid.  Assessment and Plan: * Hypertensive urgency Likely secondary to being noncompliant, patient was not taking his medications at home. Blood pressure improved. - Continue amlodipine  and losartan  -Carvedilol  was added today by cardiology  TIA (transient ischemic attack) Likely TIA as all imaging was negative for any acute lesion, MRI with concern of chronic small vessel disease.  CTA head and neck was negative for any LVO.  A1c of 5.4, elevated total cholesterol at 202, LDL 110 and HDL of 84.  Echocardiogram with new diagnosis of  HFrEF, EF of 35 to 40% and grade 1 diastolic dysfunction. - Patient was started on aspirin  and Plavix  as recommended by neurology which is being held now due to GI bleed. - Continue with statin   Acute HFrEF (heart failure with reduced ejection fraction) (HCC) No chest pain or complaint of dyspnea.  Echocardiogram with EF of 30 to 35% and grade 1 diastolic dysfunction, global hypokinesis. Bubble study was negative. Troponin was not done as there was no concern on admission. Cardiology was consulted-they will do ischemic workup with cardiac cath as outpatient due to concern of GI bleeding now. -Low-dose carvedilol  was added -Add GDMT as tolerated  GI bleed Patient overnight had a large melanotic stool and was found on floor surrounded by it.  Hemoglobin decreased to 8.9 from 11.4.  No more bleeding. GI was consulted-going for EGD and colonoscopy tomorrow morning. - Patient was started on IV Protonix  and octreotide  because of his history of alcohol abuse. -Monitor hemoglobin  Alcohol abuse - Continue with CIWA protocol -Counseling was provided  Substance abuse (HCC) UDS positive for cannabinoid and cocaine. - Counseling was provided   Subjective: Patient was resting comfortably when seen today.  Denies any chest pain or shortness of breath.  Did not had any more bloody bowel movement.  He was not taking any medications at home and has not followed up with his PCP for a while.  Initially declined any substance abuse but then admitted of using cocaine when confronted with positive UDS.  Physical Exam: Vitals:   08/14/24 0907 08/14/24 1000 08/14/24 1100 08/14/24 1200  BP: (!) 181/103 115/87 122/77 ROLLEN)  151/95  Pulse: 89 (!) 117 98 (!) 102  Resp: (!) 25 20 18 16   Temp:    98.3 F (36.8 C)  TempSrc:    Oral  SpO2: 97% 100% 98% 98%  Weight:      Height:       General.  Well-developed gentleman, in no acute distress. Pulmonary.  Lungs clear bilaterally, normal respiratory  effort. CV.  Regular rate and rhythm, no JVD, rub or murmur. Abdomen.  Soft, nontender, nondistended, BS positive. CNS.  Alert and oriented .  No focal neurologic deficit. Extremities.  No edema, no cyanosis, pulses intact and symmetrical.  Data Reviewed: Prior data reviewed  Family Communication: Discussed with patient  Disposition: Status is: Observation The patient will require care spanning > 2 midnights and should be moved to inpatient because: Severity of illness  Planned Discharge Destination: Home  DVT prophylaxis.  Lovenox  Time spent: 50 minutes  This record has been created using Conservation officer, historic buildings. Errors have been sought and corrected,but may not always be located. Such creation errors do not reflect on the standard of care.   Author: Amaryllis Dare, MD 08/14/2024 1:01 PM  For on call review [email or web address].

## 2024-08-14 NOTE — Hospital Course (Addendum)
 Partly taken from prior notes.  Fred [name] is a 48 y.o. African-American male with medical history significant for essential hypertension with noncompliance to medications for 5 years, and likely alcohol abuse, who presented to the emergency room with acute onset of lightheadedness yesterday afternoon with dysarthria and mild headache.   Patient was found to have elevated blood pressure on admission and was admitted for concern of TIA.  Symptoms resolved by the time of admission.  Neurology started on DAPT with aspirin  and Plavix .  9/3: Overnight patient was found on floor surrounded by large melanotic stool, he was hypotensive and tachycardic, was given a bolus with improvement in blood pressure.  Hemoglobin decreased to 8.9 from 11.4.  CTA GI bleed was negative for any active bleeding.  CT head and cervical spine was obtained to rule out any trauma and they were negative for any acute abnormalities.  GI was consulted, patient was started on IV Protonix  and octreotide  due to his history of alcohol abuse. Aspirin  and Plavix  are being held.  Going for EGD and colonoscopy tomorrow.  Also consulting cardiology for new diagnosis of HFrEF.  UDS was positive for cocaine and cannabinoid.  9/4: Blood pressure remained elevated, increasing the dose of losartan  to 75.  Cardiology is recommending outpatient ischemic workup.  EGD today with a nonbleeding duodenal ulcer with pigmented material and gastritis-biopsies were taken.  GI is recommending twice daily PPI for 1 month followed by daily for next 2 months.  They will follow-up on pathology results.  Octreotide  was discontinued as there was no concern of varices.  9/5: Blood pressure improving but still mildly elevated.  Losartan  was switched with Entresto .  Did not had any more GI bleeding, hemoglobin stable at 9.9, mild hypokalemia 3.2 which is being repleted.  Cardiology will do a close outpatient follow-up and ischemic workup as  outpatient. Patient was again counseled for alcohol and substance abuse especially the cocaine due to new diagnosis of HFrEF.  He was also counseled to stay compliant with his medications to avoid further complications, if he continue with being noncompliant and continue to use cocaine and alcohol he will be very high risk for deterioration and mortality.  Patient was given Protonix  to be used twice daily for 1 month and then he will do daily.  He need to have a close follow-up with his gastroenterologist for further assistance.  Patient will continue on current medications and follow-up as outpatient with his providers for further assistance.

## 2024-08-14 NOTE — H&P (View-Only) (Signed)
 [facility] GI Inpatient Consult Note   Fred [name], M.D.  Reason for Consult: Acute gastrointestinal bleeding, acute posthemorrhagic anemia   Attending Requesting Consult: Fred Peaches, MD  History of Present Illness: Fred [name] is a 48 y.o. male with a history of alcoholism who was admitted for hypertensive urgency with headache and dysarthria and possible transient ischemic attack on 08/12/2024.  Late in the evening on 08/13/2024, the nurse found the patient lying on the hospital bed floor and what appeared to be bloody stool.  Patient reportedly also had coffee-ground emesis per the nurse who found him.  Patient was then readily transferred to the intensive care unit for closer monitoring.  Patient was then placed on IV octreotide  with a bolus and drip and and given an IV bolus of Protonix  80 mg followed by twice daily IV scheduling  Past Medical History:  Past Medical History:  Diagnosis Date   Hypertension    Medical history non-contributory    Scoliosis     Problem List: Patient Active Problem List   Diagnosis Date Noted   Hypertensive urgency 08/12/2024   TIA (transient ischemic attack) 08/12/2024   Postural dizziness with presyncope 12/03/2022   Laceration of left hand 12/03/2022   Hypotension 12/03/2022   Alcohol use disorder 12/03/2022   SIRS (systemic inflammatory response syndrome) (HCC) 12/03/2022   Hematochezia 12/03/2022   AKI (acute kidney injury) (HCC) 12/03/2022   Elevated blood sugar 12/03/2022   COVID-19 virus infection 12/03/2022   Elevated troponin 12/03/2022   ABLA (acute blood loss anemia) 12/03/2022   Suicidal ideation 12/14/2018   Alcohol abuse 12/14/2018   Hypertension 12/14/2018   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 (Patient not taking: Reported on 08/12/2024) 30 tablet 1 Not Taking   lisinopril  (ZESTRIL ) 10 MG tablet Take 1 tablet (10 mg total) by mouth daily. (Patient not taking: Reported on 08/12/2024) 30 tablet 1 Not Taking   naltrexone  (DEPADE) 50 MG tablet Take 1 tablet (50 mg total) by mouth daily. (Patient not taking: Reported on 12/03/2022) 30 tablet 1    thiamine  100 MG tablet Take 1 tablet (100 mg total) by mouth daily. (Patient not taking: Reported on 08/12/2024) 30 tablet 1 Not Taking   Home medication reconciliation was completed with the patient.   Scheduled Inpatient Medications:     stroke: early stages of recovery book   Does not apply Once   amLODipine   5 mg Oral Daily   aspirin  EC  81 mg Oral Daily   atorvastatin   40 mg Oral Daily   Chlorhexidine  Gluconate Cloth  6 each Topical QHS   clopidogrel   75 mg Oral Daily   enoxaparin  (LOVENOX ) injection  40 mg Subcutaneous Q24H   folic acid   1 mg Oral Daily   lisinopril   10 mg Oral Daily   multivitamin with minerals  1 tablet Oral Daily   pantoprazole  (PROTONIX ) IV  40 mg Intravenous Q12H   thiamine   100 mg Oral Daily   Or   thiamine   100 mg Intravenous Daily    Continuous Inpatient Infusions:  sodium chloride  100 mL/hr at 08/14/24 9380   octreotide  (SANDOSTATIN ) 500 mcg in sodium chloride  0.9 % 250 mL (2 mcg/mL) infusion 50 mcg/hr (08/14/24 0619)    PRN Inpatient Medications:  acetaminophen  **OR** acetaminophen  (TYLENOL ) oral liquid 160 mg/5 mL **OR** acetaminophen , hydrALAZINE , labetalol , LORazepam  **OR** LORazepam , ondansetron  (ZOFRAN ) IV, senna-docusate  Family History: family history is not on file.   GI Family History: Negative  Social History:   reports that he has been smoking cigarettes. He has  never used smokeless tobacco. He reports current alcohol use of about 1.0 standard drink of alcohol per week. He reports current drug use. Frequency: 1.00 time per week. Drug: Marijuana. The patient denies ETOH, tobacco, or drug use.    Review of Systems: Review of Systems - Negative except history of present illness.  Physical Examination: BP (!) 156/95 (BP Location: Left Arm)   Pulse 80   Temp 97.8 F (36.6 C) (Axillary)   Resp (!) 23   Ht 5' (1.524 m)   Wt 71.3 kg   SpO2 99%   BMI 30.70 kg/m  Physical Exam Constitutional:      General: He is sleeping. He is not in acute distress.    Appearance: He is ill-appearing. He is not toxic-appearing.  HENT:     Nose: Nose normal.     Mouth/Throat:     Mouth: Mucous membranes are moist.     Pharynx: No oropharyngeal exudate or posterior oropharyngeal erythema.  Eyes:     General: No scleral icterus.    Pupils: Pupils are equal, round, and reactive to light.  Cardiovascular:     Rate and Rhythm: Normal rate.     Heart sounds: No murmur heard.    No gallop.  Pulmonary:     Effort: No respiratory distress.     Breath sounds: Normal breath sounds. No stridor.  Abdominal:     General: There is no distension.     Palpations: Abdomen is soft. There is no mass.     Tenderness: There is no abdominal tenderness. There is no rebound.  Skin:    General: Skin is warm and dry.  Neurological:     Mental Status: He is easily aroused.  Psychiatric:        Mood and Affect: Mood normal.     Data: Lab Results  Component Value Date   WBC 11.5 (H) 08/14/2024   HGB 8.6 (L) 08/14/2024   HCT 28.6 (L) 08/14/2024   MCV 78.1 (L) 08/14/2024   PLT 247 08/14/2024   Recent Labs  Lab 08/12/24 2146 08/13/24 2324 08/14/24 0452  HGB 11.4* 8.9* 8.6*   Lab Results  Component Value Date   NA 139 08/14/2024   K 4.0 08/14/2024   CL 107 08/14/2024   CO2 24 08/14/2024   BUN 29 (H) 08/14/2024   CREATININE 1.17 08/14/2024   Lab Results   Component Value Date   ALT 11 08/13/2024   AST 22 08/13/2024   ALKPHOS 62 08/13/2024   BILITOT 0.6 08/13/2024   Recent Labs  Lab 08/13/24 2324 08/13/24 2326  APTT  --  22*  INR 1.1  --       Latest Ref Rng & Units 08/14/2024    4:52 AM 08/13/2024   11:24 PM 08/12/2024    9:46 PM  CBC  WBC 4.0 - 10.5 K/uL 11.5  12.5  7.6   Hemoglobin 13.0 - 17.0 g/dL 8.6  8.9  88.5   Hematocrit 39.0 - 52.0 % 28.6  29.8  37.9   Platelets 150 - 400 K/uL 247  314  316     STUDIES: CT ANGIO GI BLEED Result Date: 08/14/2024 CLINICAL DATA:  Abdominal trauma, blunt, hematochezia, found down EXAM: CTA ABDOMEN AND PELVIS WITHOUT AND WITH CONTRAST TECHNIQUE: Multidetector CT imaging of the abdomen and pelvis was performed using the standard protocol during bolus administration of intravenous contrast. Multiplanar reconstructed images and MIPs were obtained and reviewed to evaluate the vascular anatomy. RADIATION DOSE REDUCTION: This exam was performed according to the departmental dose-optimization program which includes automated exposure control, adjustment of the mA and/or kV according to patient size and/or use of iterative reconstruction technique. CONTRAST:  OMNIPAQUE  IOHEXOL  350 MG/ML SOLN COMPARISON:  None Available. FINDINGS: VASCULAR Aorta: Normal caliber aorta without aneurysm, dissection, vasculitis or significant stenosis. Mild atherosclerotic calcification Celiac: Patent without evidence of aneurysm, dissection, vasculitis or significant stenosis. SMA: Patent without evidence of aneurysm, dissection, vasculitis or significant stenosis. Renals: Both renal arteries are patent without evidence of aneurysm, dissection, vasculitis, fibromuscular dysplasia or significant stenosis. IMA: Patent without evidence of aneurysm, dissection, vasculitis or significant stenosis. Inflow: Patent without evidence of aneurysm, dissection, vasculitis or significant stenosis. Proximal Outflow: Bilateral common femoral and  visualized portions of the superficial and profunda femoral arteries are patent without evidence of aneurysm, dissection, vasculitis or significant stenosis. Veins: Unremarkable Review of the MIP images confirms the above findings. NON-VASCULAR Lower chest: No acute abnormality. Global cardiac size within normal limits though left ventricular hypertrophy is noted. Hypoattenuation of the cardiac blood pool in keeping with at least mild anemia. Hepatobiliary: No focal liver abnormality is seen. No gallstones, gallbladder wall thickening, or biliary dilatation. Pancreas: Unremarkable Spleen: Unremarkable Adrenals/Urinary Tract: The adrenal glands are unremarkable. Tiny hypodensity within the interpolar region of the left kidney likely represent a tiny cortical cyst no follow-up imaging is recommended. The kidneys are otherwise unremarkable. There is mild distention of bladder and circumferential bladder wall thickening present suggesting changes of chronic bladder outlet obstruction. Stomach/Bowel: No active extravasation identified. Stomach, small bowel, and large bowel are unremarkable. Appendix normal. No free intraperitoneal gas or fluid. Lymphatic: No pathologic adenopathy within the abdomen pelvis. Reproductive: Marked prostatic hypertrophy Other: No abdominal wall hernia or abnormality. No abdominopelvic ascites. Musculoskeletal: No acute or significant osseous findings. IMPRESSION: 1. No active extravasation identified. 2. Left ventricular hypertrophy. 3. Mild anemia. 4. Marked prostatic hypertrophy with changes of chronic bladder outlet obstruction. 5. Aortic atherosclerosis. Aortic Atherosclerosis (ICD10-I70.0). Electronically Signed   By: Dorethia Molt M.D.   On: 08/14/2024 00:53   CT HEAD WO CONTRAST ( ) Result Date: 08/14/2024 EXAM: CT HEAD AND CERVICAL SPINE 08/14/2024 12:35:52 AM TECHNIQUE: CT of the head and cervical spine was performed without the administration of intravenous contrast.  Multiplanar reformatted images are provided for review. Automated exposure control, iterative reconstruction, and/or weight based adjustment of the mA/kV was utilized to reduce the radiation dose to as low as reasonably achievable. COMPARISON: None available. CLINICAL HISTORY: Syncope/presyncope, cerebrovascular cause suspected. Received call from telemetry patient leads off. Patient found down with bloody stools all over room and bathroom. Rapid response called. Patient stated he felt like he was going to pass out. Possibly lost consciousness. Unsure. 1000ml fluid bolus started. Patient transferred to ICU. FINDINGS: CT HEAD BRAIN AND VENTRICLES: No acute intracranial hemorrhage. No mass effect or midline shift. No abnormal extra-axial fluid collection. No evidence of acute infarct. No hydrocephalus. ORBITS: No acute abnormality. SINUSES AND MASTOIDS: Right maxillary sinus retention cyst. SOFT TISSUES  AND SKULL: No acute skull fracture. No acute soft tissue abnormality. CT CERVICAL SPINE BONES AND ALIGNMENT: No acute fracture or traumatic malalignment. DEGENERATIVE CHANGES: No significant degenerative changes. SOFT TISSUES: No prevertebral soft tissue swelling. IMPRESSION: 1. No acute intracranial abnormality. 2. No acute fracture or traumatic malalignment of the cervical spine. Electronically signed by: Franky Stanford MD 08/14/2024 12:45 AM EDT RP Workstation: [id number]   CT CERVICAL SPINE WO CONTRAST Result Date: 08/14/2024 EXAM: CT HEAD AND CERVICAL SPINE 08/14/2024 12:35:52 AM TECHNIQUE: CT of the head and cervical spine was performed without the administration of intravenous contrast. Multiplanar reformatted images are provided for review. Automated exposure control, iterative reconstruction, and/or weight based adjustment of the mA/kV was utilized to reduce the radiation dose to as low as reasonably achievable. COMPARISON: None available. CLINICAL HISTORY: Syncope/presyncope, cerebrovascular cause suspected.  Received call from telemetry patient leads off. Patient found down with bloody stools all over room and bathroom. Rapid response called. Patient stated he felt like he was going to pass out. Possibly lost consciousness. Unsure. 1000ml fluid bolus started. Patient transferred to ICU. FINDINGS: CT HEAD BRAIN AND VENTRICLES: No acute intracranial hemorrhage. No mass effect or midline shift. No abnormal extra-axial fluid collection. No evidence of acute infarct. No hydrocephalus. ORBITS: No acute abnormality. SINUSES AND MASTOIDS: Right maxillary sinus retention cyst. SOFT TISSUES AND SKULL: No acute skull fracture. No acute soft tissue abnormality. CT CERVICAL SPINE BONES AND ALIGNMENT: No acute fracture or traumatic malalignment. DEGENERATIVE CHANGES: No significant degenerative changes. SOFT TISSUES: No prevertebral soft tissue swelling. IMPRESSION: 1. No acute intracranial abnormality. 2. No acute fracture or traumatic malalignment of the cervical spine. Electronically signed by: Franky Stanford MD 08/14/2024 12:45 AM EDT RP Workstation: [id number]   ECHOCARDIOGRAM COMPLETE BUBBLE STUDY Result Date: 08/13/2024    ECHOCARDIOGRAM REPORT   Patient Name:   Fred [name] Date of Exam: 08/13/2024 Medical Rec #:  [id number]               Height:       60.0 in Accession #:    [id number]              Weight:       160.0 lb Date of Birth:  [birth year]               BSA:          1.698 m Patient Age:    48 years                BP:           138/99 mmHg Patient Gender: M                       HR:           82 bpm. Exam Location:  [facility] Procedure: 2D Echo, Color Doppler, Cardiac Doppler, Saline Contrast Bubble            Study, Strain Analysis and 3D Echo (Both Spectral and Color Flow            Doppler were utilized during procedure). Indications:     TIA 435.9 / G45.9  History:         Patient has no prior history of Echocardiogram examinations.                  Risk Factors:Hypertension.  Sonographer:     Christopher Furnace  Referring Phys:  [id number] JAN A MANSY Diagnosing Phys: Lonni End  MD  Sonographer Comments: Global longitudinal strain was attempted. IMPRESSIONS  1. Left ventricular ejection fraction, by estimation, is 35 to 40%. Left ventricular ejection fraction by 3D volume is 36 %. The left ventricle has moderately decreased function. The left ventricle demonstrates global hypokinesis. There is mild left ventricular hypertrophy. Left ventricular diastolic parameters are consistent with Grade I diastolic dysfunction (impaired relaxation). Elevated left atrial pressure. The average left ventricular global longitudinal strain is -4.4 %.  2. Right ventricular systolic function is normal. The right ventricular size is normal. Tricuspid regurgitation signal is inadequate for assessing PA pressure.  3. The mitral valve is normal in structure. No evidence of mitral valve regurgitation. No evidence of mitral stenosis.  4. The aortic valve has an indeterminant number of cusps. Aortic valve regurgitation is not visualized. No aortic stenosis is present.  5. Agitated saline contrast bubble study was negative, with no evidence of any interatrial shunt. FINDINGS  Left Ventricle: Left ventricular ejection fraction, by estimation, is 35 to 40%. Left ventricular ejection fraction by 3D volume is 36 %. The left ventricle has moderately decreased function. The left ventricle demonstrates global hypokinesis. The average left ventricular global longitudinal strain is -4.4 %. The left ventricular internal cavity size was normal in size. There is mild left ventricular hypertrophy. Left ventricular diastolic parameters are consistent with Grade I diastolic dysfunction (impaired relaxation). Elevated left atrial pressure. Right Ventricle: The right ventricular size is normal. No increase in right ventricular wall thickness. Right ventricular systolic function is normal. Tricuspid regurgitation signal is inadequate for assessing PA pressure. Left  Atrium: Left atrial size was normal in size. Right Atrium: Right atrial size was normal in size. Pericardium: There is no evidence of pericardial effusion. Mitral Valve: The mitral valve is normal in structure. No evidence of mitral valve regurgitation. No evidence of mitral valve stenosis. MV peak gradient, 2.5 mmHg. The mean mitral valve gradient is 1.0 mmHg. Tricuspid Valve: The tricuspid valve is normal in structure. Tricuspid valve regurgitation is trivial. Aortic Valve: The aortic valve has an indeterminant number of cusps. Aortic valve regurgitation is not visualized. No aortic stenosis is present. Aortic valve mean gradient measures 2.5 mmHg. Aortic valve peak gradient measures 5.2 mmHg. Aortic valve area, by VTI measures 2.16 cm. Pulmonic Valve: The pulmonic valve was not well visualized. Pulmonic valve regurgitation is not visualized. No evidence of pulmonic stenosis. Aorta: The aortic root is normal in size and structure. Venous: The inferior vena cava was not well visualized. IAS/Shunts: The interatrial septum was not well visualized. Agitated saline contrast was given intravenously to evaluate for intracardiac shunting. Agitated saline contrast bubble study was negative, with no evidence of any interatrial shunt.  LEFT VENTRICLE PLAX 2D LVIDd:         5.00 cm         Diastology LVIDs:         3.80 cm         LV e' medial:    3.37 cm/s LV PW:         1.10 cm         LV E/e' medial:  15.3 LV IVS:        1.20 cm         LV e' lateral:   3.37 cm/s LVOT diam:     2.20 cm         LV E/e' lateral: 15.3 LV SV:         30 LV SV Index:   17  2D Longitudinal LVOT Area:     3.80 cm        Strain                                2D Strain GLS   -4.4 %                                Avg: LV Volumes (MOD) LV vol d, MOD    72.7 ml       3D Volume EF A2C:                           LV 3D EF:    Left LV vol d, MOD    93.2 ml                    ventricul A4C:                                        ar LV vol s,  MOD    50.9 ml                    ejection A2C:                                        fraction LV vol s, MOD    62.9 ml                    by 3D A4C:                                        volume is LV SV MOD A2C:   21.8 ml                    36 %. LV SV MOD A4C:   93.2 ml LV SV MOD BP:    23.7 ml                                3D Volume EF:                                3D EF:        36 % RIGHT VENTRICLE RV Basal diam:  2.50 cm RV Mid diam:    1.80 cm RV S prime:     9.03 cm/s TAPSE (M-mode): 2.3 cm LEFT ATRIUM             Index        RIGHT ATRIUM           Index LA diam:        2.50 cm 1.47 cm/m   RA Area:     10.30 cm LA Vol (A2C):   24.8 ml 14.61 ml/m  RA Volume:   20.60 ml  12.13 ml/m LA Vol (A4C):   25.7 ml 15.14 ml/m LA Biplane Vol: 25.7 ml 15.14 ml/m  AORTIC VALVE AV Area (Vmax):    2.01 cm AV Area (Vmean):   2.06 cm AV Area (VTI):     2.16 cm AV Vmax:           113.50 cm/s AV Vmean:          76.000 cm/s AV VTI:            0.137 m AV Peak Grad:      5.2 mmHg AV Mean Grad:      2.5 mmHg LVOT Vmax:         59.90 cm/s LVOT Vmean:        41.100 cm/s LVOT VTI:          0.078 m LVOT/AV VTI ratio: 0.57  AORTA Ao Root diam: 3.60 cm MITRAL VALVE MV Area (PHT): 4.86 cm    SHUNTS MV Area VTI:   1.72 cm    Systemic VTI:  0.08 m MV Peak grad:  2.5 mmHg    Systemic Diam: 2.20 cm MV Mean grad:  1.0 mmHg MV Vmax:       0.80 m/s MV Vmean:      53.5 cm/s MV Decel Time: 156 msec MV E velocity: 51.40 cm/s MV A velocity: 67.10 cm/s MV E/A ratio:  0.77 Lonni End MD Electronically signed by Lonni Hanson MD Signature Date/Time: 08/13/2024/6:46:39 PM    Final    CT ANGIO HEAD NECK W WO CM Result Date: 08/13/2024 EXAM: CTA Head and Neck with Intravenous Contrast. CT Head without Contrast. CLINICAL HISTORY: Stroke, follow up. Stroke, follow up. TECHNIQUE: Axial CTA images of the head and neck performed with intravenous contrast. MIP reconstructed images were created and reviewed. Axial computed tomography images  of the head/brain performed without intravenous contrast. Note: Per PQRS, the description of internal carotid artery percent stenosis, including 0 percent or normal exam, is based on [place] American Symptomatic Carotid Endarterectomy Trial (NASCET) criteria. Dose reduction technique was used including one or more of the following: automated exposure control, adjustment of mA and kV according to patient size, and/or iterative reconstruction. CONTRAST: 75 mL iohexol  (OMNIPAQUE ) 350 MG/ML injection. COMPARISON: None provided. FINDINGS: CT HEAD: BRAIN: No acute intraparenchymal hemorrhage. No mass lesion. No CT evidence for acute territorial infarct. No midline shift or extra-axial collection. Grey-white matter differentiation is maintained. No edema, mass effect, or midline shift. VENTRICLES: No hydrocephalus. ORBITS: The orbits are unremarkable. SINUSES AND MASTOIDS: Large mucous retention cyst in the right maxillary sinus. CTA NECK: COMMON CAROTID ARTERIES: Mixed atherosclerotic plaque at the right carotid bifurcation without hemodynamically significant stenosis. Additional mixed atherosclerotic plaque at the left carotid bifurcation without hemodynamically significant stenosis. INTERNAL CAROTID ARTERIES: No stenosis by NASCET criteria. No dissection or occlusion. VERTEBRAL ARTERIES: No significant stenosis. No dissection or occlusion. CTA HEAD: ANTERIOR CEREBRAL ARTERIES: No significant stenosis. No occlusion. No aneurysm. MIDDLE CEREBRAL ARTERIES: No significant stenosis. No occlusion. No aneurysm. POSTERIOR CEREBRAL ARTERIES: No significant stenosis. No occlusion. No aneurysm. BASILAR ARTERY: No significant stenosis. No occlusion. No aneurysm. OTHER: Mild atherosclerosis along the left cavernous ICA without significant stenosis. The intracranial internal carotid artery is patent bilaterally. The middle cerebral arteries are patent bilaterally. The anterior cerebral arteries are patent bilaterally. SOFT TISSUES: No  acute finding. No masses or lymphadenopathy. BONES: No acute osseous abnormality. Degenerative changes in the visualized spine greatest at C4-5. IMPRESSION: 1. No acute intracranial abnormality. 2. No large vessel occlusion. 3. Mixed atherosclerotic plaque at the carotid bifurcations without hemodynamically significant stenosis. 4. Mild atherosclerosis along the  left cavernous ICA without significant stenosis. Electronically signed by: Donnice Mania MD 08/13/2024 05:07 PM EDT RP Workstation: [id number]   MR BRAIN WO CONTRAST Result Date: 08/13/2024 EXAM: MR Brain without Intravenous Contrast. CLINICAL HISTORY: Neuro deficit, acute, stroke suspected. Came in with untreated hypertension; patient states felt dizzy but does not know when it started. TECHNIQUE: Magnetic resonance images of the brain without intravenous contrast in multiple planes. CONTRAST: None. COMPARISON: None provided. FINDINGS: BRAIN: Minimal nonspecific white matter T2-weighted signal hyperintensities, which may be associated with early chronic small vessel disease or migraine headaches. No restricted diffusion to indicate acute infarction. No intracranial mass or hemorrhage. No midline shift or extra-axial fluid collection. No cerebellar tonsillar ectopia. The central arterial and venous flow voids are patent. VENTRICLES: No hydrocephalus. ORBITS: The orbits are normal. SINUSES AND MASTOIDS: Right maxillary sinus retention cyst. The remaining sinuses and mastoid air cells are clear. BONES: No acute fracture or focal osseous lesion. IMPRESSION: 1. No acute findings. 2. Minimal nonspecific white matter T2-weighted signal hyperintensities, possibly related to early chronic small vessel disease or migraine headaches. Electronically signed by: Franky Stanford MD 08/13/2024 12:34 AM EDT RP Workstation: [id number]   CT HEAD WO CONTRAST Result Date: 08/12/2024 EXAM: CT HEAD WITHOUT CONTRAST 08/12/2024 10:08:08 PM TECHNIQUE: CT of the head was performed  without the administration of intravenous contrast. Automated exposure control, iterative reconstruction, and/or weight based adjustment of the mA/kV was utilized to reduce the radiation dose to as low as reasonably achievable. COMPARISON: 10/29/2020 CLINICAL HISTORY: Neuro deficit, acute, stroke suspected; dysarthria for 1 day, eval for stroke. BIB ems for hypertension, has not been taking for the last 6 years. Per pt I think I might have had a stroke yesterday. I had a headache, my BP was up, I was having trouble getting words out, and I was dizzy. Pt unsure of the time of onset. All symptoms have resolved except hypertension. FINDINGS: BRAIN AND VENTRICLES: No acute hemorrhage. No evidence of acute infarct. No hydrocephalus. No extra-axial collection. No mass effect or midline shift. ORBITS: No acute abnormality. SINUSES: No acute abnormality. SOFT TISSUES AND SKULL: No acute soft tissue abnormality. No skull fracture. IMPRESSION: 1. No acute intracranial abnormality. Electronically signed by: Franky Stanford MD 08/12/2024 10:29 PM EDT RP Workstation: [id number]   @IMAGES @  Assessment: Acute gastrointestinal bleeding - Bleeding from below, but dark suggesting upper GI source. Ddx includes esophageal varices, PUD, Dieulafoy lesion, Upper GI malignancy (esophageal vs gastric neoplasm). Less likely small bowel source (meckel's diverticulum, vascular malformations, small bowel neoplasm, etc.). Also consider Lower GI source such as right sided colonic diverticula, ischemic colitis, SCAD, colon malignancy, etc. Patient appears stable at present after transfer to the ICU and IV octreotide . Post hemorrhagic anemia Uncontrolled hypertension with sequelae. Possible TIA - Per neurology, no ongoing cerebrovascular event. Recommended ASA 81mg  daily when feasible. Given only one dose of Plavix  yesterday during stroke workup, none prior to that. Acute HFrEF - No acute coronary events noted. Cardiology postponing workup  secondary to gastrointestinal bleeding. Alcohol abuse. THC and Cocaine positive UDS.     Recommendations:  Continue CIWA protocol as ordered. 2.  Clear liquid diet. 3. Continue IV octreotide , IV acid suppression. 4.  Serial H/H. 5.  Agree with holding Lovenox  for now.  6.  EGD. The patient understands the nature of the planned procedure, indications, risks, alternatives and potential complications including but not limited to bleeding, infection, perforation, damage to internal organs and possible oversedation/side effects from anesthesia. The patient agrees and gives  consent to proceed.  Please refer to procedure notes for findings, recommendations and patient disposition/instructions. 7.  If EGD negative, consider colonoscopy. 8. Will follow along with you.  Thank you for the consult. Please call with questions or concerns.  Aundria Fred Eck MD [facility] [address] [place], [place] [address] [phone]  08/14/2024 8:26 AM

## 2024-08-14 NOTE — Telephone Encounter (Signed)
 Pharmacy Patient Advocate Encounter  Received notification from [facility] MEDICAID that Prior Authorization for Farxiga  10 mg tablets has been APPROVED from 08/14/2024 to 08/14/2025. Ran test claim, Copay is $4.00. This test claim was processed through [facility]- copay amounts may vary at other pharmacies due to pharmacy/plan contracts, or as the patient moves through the different stages of their insurance plan.   PA #/Case ID/Reference #: [id number]

## 2024-08-14 NOTE — Assessment & Plan Note (Signed)
-   Continue with CIWA protocol -Counseling was provided

## 2024-08-14 NOTE — Progress Notes (Signed)
 NEUROLOGY CONSULT FOLLOW UP NOTE   Date of service: August 14, 2024 Patient Name: Fred [name] MRN:  [id number] DOB:  [birth year]  Interval Hx/subjective   Overnight events are noted. Vitals   Vitals:   08/14/24 1100 08/14/24 1200 08/14/24 1300 08/14/24 1400  BP: 122/77 (!) 151/95 (!) 144/94 134/88  Pulse: 98 (!) 102 (!) 103 (!) 101  Resp: 18 16 (!) 21 (!) 23  Temp:  98.3 F (36.8 C)    TempSrc:  Oral    SpO2: 98% 98% 95% 98%  Weight:      Height:         Body mass index is 30.7 kg/m.  Physical Exam   Constitutional: Appears well-developed and well-nourished.  Neurologic Examination    MS: Awake, alert, interactive and appropriate CN: VFF, face symmetric Motor: No drift Sensory: Intact to light touch  Medications  Current Facility-Administered Medications:     stroke: early stages of recovery book, , Does not apply, Once, Mansy, Jan A, MD   0.9 %  sodium chloride  infusion, , Intravenous, Continuous, Mansy, Jan A, MD, Last Rate: 100 mL/hr at 08/14/24 0948, New Bag at 08/14/24 0948   acetaminophen  (TYLENOL ) tablet 650 mg, 650 mg, Oral, Q4H PRN, 650 mg at 08/12/24 2356 **OR** acetaminophen  (TYLENOL ) 160 MG/5ML solution 650 mg, 650 mg, Per Tube, Q4H PRN **OR** acetaminophen  (TYLENOL ) suppository 650 mg, 650 mg, Rectal, Q4H PRN, Mansy, Jan A, MD   amLODipine  (NORVASC ) tablet 5 mg, 5 mg, Oral, Daily, Mansy, Jan A, MD, 5 mg at 08/14/24 9146   atorvastatin  (LIPITOR) tablet 40 mg, 40 mg, Oral, Daily, Djan, Prince T, MD, 40 mg at 08/14/24 0853   carvedilol  (COREG ) tablet 6.25 mg, 6.25 mg, Oral, BID WC, Gollan, Timothy J, MD, 6.25 mg at 08/14/24 0947   Chlorhexidine  Gluconate Cloth 2 % PADS 6 each, 6 each, Topical, QHS, Mansy, Jan A, MD, 6 each at 08/13/24 2349   enoxaparin  (LOVENOX ) injection 40 mg, 40 mg, Subcutaneous, Q24H, Belue, Rankin RAMAN, RPH, 40 mg at 08/13/24 9045   folic acid  (FOLVITE ) tablet 1 mg, 1 mg, Oral, Daily, Mansy, Jan A, MD, 1 mg at 08/14/24 9146    hydrALAZINE  (APRESOLINE ) injection 10 mg, 10 mg, Intravenous, Q6H PRN, Mansy, Jan A, MD, 10 mg at 08/14/24 0909   labetalol  (NORMODYNE ) injection 20 mg, 20 mg, Intravenous, Q3H PRN, Mansy, Jan A, MD   LORazepam  (ATIVAN ) tablet 1-4 mg, 1-4 mg, Oral, Q1H PRN **OR** LORazepam  (ATIVAN ) injection 1-4 mg, 1-4 mg, Intravenous, Q1H PRN, Mansy, Jan A, MD, 1 mg at 08/13/24 2042   [START ON 08/15/2024] losartan  (COZAAR ) tablet 50 mg, 50 mg, Oral, Daily, Amin, Sumayya, MD   multivitamin with minerals tablet 1 tablet, 1 tablet, Oral, Daily, Mansy, Jan A, MD, 1 tablet at 08/14/24 0900   [COMPLETED] octreotide  (SANDOSTATIN ) 2 mcg/mL load via infusion 50 mcg, 50 mcg, Intravenous, Once, 50 mcg at 08/14/24 0109 **AND** octreotide  (SANDOSTATIN ) 500 mcg in sodium chloride  0.9 % 250 mL (2 mcg/mL) infusion, 50 mcg/hr, Intravenous, Continuous, Mansy, Jan A, MD, Last Rate: 25 mL/hr at 08/14/24 0901, 50 mcg/hr at 08/14/24 0901   ondansetron  (ZOFRAN ) injection 4 mg, 4 mg, Intravenous, Q4H PRN, Mansy, Jan A, MD, 4 mg at 08/13/24 2026   pantoprazole  (PROTONIX ) injection 40 mg, 40 mg, Intravenous, Q12H, Mansy, Jan A, MD, 40 mg at 08/14/24 9146   senna-docusate (Senokot-S) tablet 1 tablet, 1 tablet, Oral, QHS PRN, Mansy, Jan A, MD   thiamine  (VITAMIN B1) tablet 100  mg, 100 mg, Oral, Daily, 100 mg at 08/14/24 0853 **OR** thiamine  (VITAMIN B1) injection 100 mg, 100 mg, Intravenous, Daily, Mansy, Madison LABOR, MD  Labs and Diagnostic Imaging   Assessment   Emiliano Welshans is a 48 y.o. male with transient episode of severe dysarthria concerning for transient ischemic attack.  I continue to think that this was most likely TIA, and it is unfortunate that he developed GI bleeding.  If, in the future, it is felt safe to restart his aspirin  then I would start with aspirin  monotherapy 81 mg daily.  Recommendations  Continue atorvastatin  40 mg daily Restart ASA 81mg  daily if felt safe to do so Neurology will be available as  needed ______________________________________________________________________   Signed, Aisha Seals, MD [facility]

## 2024-08-14 NOTE — Consult Note (Signed)
 [facility] GI Inpatient Consult Note   [name], M.D.  Reason for Consult: Acute gastrointestinal bleeding, acute posthemorrhagic anemia   Attending Requesting Consult: Madison Peaches, MD  History of Present Illness: Fred [name] is a 48 y.o. male with a history of alcoholism who was admitted for hypertensive urgency with headache and dysarthria and possible transient ischemic attack on 08/12/2024.  Late in the evening on 08/13/2024, the nurse found the patient lying on the hospital bed floor and what appeared to be bloody stool.  Patient reportedly also had coffee-ground emesis per the nurse who found him.  Patient was then readily transferred to the intensive care unit for closer monitoring.  Patient was then placed on IV octreotide  with a bolus and drip and and given an IV bolus of Protonix  80 mg followed by twice daily IV scheduling  Past Medical History:  Past Medical History:  Diagnosis Date   Hypertension    Medical history non-contributory    Scoliosis     Problem List: Patient Active Problem List   Diagnosis Date Noted   Hypertensive urgency 08/12/2024   TIA (transient ischemic attack) 08/12/2024   Postural dizziness with presyncope 12/03/2022   Laceration of left hand 12/03/2022   Hypotension 12/03/2022   Alcohol use disorder 12/03/2022   SIRS (systemic inflammatory response syndrome) (HCC) 12/03/2022   Hematochezia 12/03/2022   AKI (acute kidney injury) (HCC) 12/03/2022   Elevated blood sugar 12/03/2022   COVID-19 virus infection 12/03/2022   Elevated troponin 12/03/2022   ABLA (acute blood loss anemia) 12/03/2022   Suicidal ideation 12/14/2018   Alcohol abuse 12/14/2018   Hypertension 12/14/2018   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 (Patient not taking: Reported on 08/12/2024) 30 tablet 1 Not Taking   lisinopril  (ZESTRIL ) 10 MG tablet Take 1 tablet (10 mg total) by mouth daily. (Patient not taking: Reported on 08/12/2024) 30 tablet 1 Not Taking   naltrexone  (DEPADE) 50 MG tablet Take 1 tablet (50 mg total) by mouth daily. (Patient not taking: Reported on 12/03/2022) 30 tablet 1    thiamine  100 MG tablet Take 1 tablet (100 mg total) by mouth daily. (Patient not taking: Reported on 08/12/2024) 30 tablet 1 Not Taking   Home medication reconciliation was completed with the patient.   Scheduled Inpatient Medications:     stroke: early stages of recovery book   Does not apply Once   amLODipine   5 mg Oral Daily   aspirin  EC  81 mg Oral Daily   atorvastatin   40 mg Oral Daily   Chlorhexidine  Gluconate Cloth  6 each Topical QHS   clopidogrel   75 mg Oral Daily   enoxaparin  (LOVENOX ) injection  40 mg Subcutaneous Q24H   folic acid   1 mg Oral Daily   lisinopril   10 mg Oral Daily   multivitamin with minerals  1 tablet Oral Daily   pantoprazole  (PROTONIX ) IV  40 mg Intravenous Q12H   thiamine   100 mg Oral Daily   Or   thiamine   100 mg Intravenous Daily    Continuous Inpatient Infusions:  sodium chloride  100 mL/hr at 08/14/24 9380   octreotide  (SANDOSTATIN ) 500 mcg in sodium chloride  0.9 % 250 mL (2 mcg/mL) infusion 50 mcg/hr (08/14/24 0619)    PRN Inpatient Medications:  acetaminophen  **OR** acetaminophen  (TYLENOL ) oral liquid 160 mg/5 mL **OR** acetaminophen , hydrALAZINE , labetalol , LORazepam  **OR** LORazepam , ondansetron  (ZOFRAN ) IV, senna-docusate  Family History: family history is not on file.   GI Family History: Negative  Social History:   reports that he has been smoking cigarettes. He has  never used smokeless tobacco. He reports current alcohol use of about 1.0 standard drink of alcohol per week. He reports current drug use. Frequency: 1.00 time per week. Drug: Marijuana. The patient denies ETOH, tobacco, or drug use.    Review of Systems: Review of Systems - Negative except history of present illness.  Physical Examination: BP (!) 156/95 (BP Location: Left Arm)   Pulse 80   Temp 97.8 F (36.6 C) (Axillary)   Resp (!) 23   Ht 5' (1.524 m)   Wt 71.3 kg   SpO2 99%   BMI 30.70 kg/m  Physical Exam Constitutional:      General: He is sleeping. He is not in acute distress.    Appearance: He is ill-appearing. He is not toxic-appearing.  HENT:     Nose: Nose normal.     Mouth/Throat:     Mouth: Mucous membranes are moist.     Pharynx: No oropharyngeal exudate or posterior oropharyngeal erythema.  Eyes:     General: No scleral icterus.    Pupils: Pupils are equal, round, and reactive to light.  Cardiovascular:     Rate and Rhythm: Normal rate.     Heart sounds: No murmur heard.    No gallop.  Pulmonary:     Effort: No respiratory distress.     Breath sounds: Normal breath sounds. No stridor.  Abdominal:     General: There is no distension.     Palpations: Abdomen is soft. There is no mass.     Tenderness: There is no abdominal tenderness. There is no rebound.  Skin:    General: Skin is warm and dry.  Neurological:     Mental Status: He is easily aroused.  Psychiatric:        Mood and Affect: Mood normal.     Data: Lab Results  Component Value Date   WBC 11.5 (H) 08/14/2024   HGB 8.6 (L) 08/14/2024   HCT 28.6 (L) 08/14/2024   MCV 78.1 (L) 08/14/2024   PLT 247 08/14/2024   Recent Labs  Lab 08/12/24 2146 08/13/24 2324 08/14/24 0452  HGB 11.4* 8.9* 8.6*   Lab Results  Component Value Date   NA 139 08/14/2024   K 4.0 08/14/2024   CL 107 08/14/2024   CO2 24 08/14/2024   BUN 29 (H) 08/14/2024   CREATININE 1.17 08/14/2024   Lab Results   Component Value Date   ALT 11 08/13/2024   AST 22 08/13/2024   ALKPHOS 62 08/13/2024   BILITOT 0.6 08/13/2024   Recent Labs  Lab 08/13/24 2324 08/13/24 2326  APTT  --  22*  INR 1.1  --       Latest Ref Rng & Units 08/14/2024    4:52 AM 08/13/2024   11:24 PM 08/12/2024    9:46 PM  CBC  WBC 4.0 - 10.5 K/uL 11.5  12.5  7.6   Hemoglobin 13.0 - 17.0 g/dL 8.6  8.9  88.5   Hematocrit 39.0 - 52.0 % 28.6  29.8  37.9   Platelets 150 - 400 K/uL 247  314  316     STUDIES: CT ANGIO GI BLEED Result Date: 08/14/2024 CLINICAL DATA:  Abdominal trauma, blunt, hematochezia, found down EXAM: CTA ABDOMEN AND PELVIS WITHOUT AND WITH CONTRAST TECHNIQUE: Multidetector CT imaging of the abdomen and pelvis was performed using the standard protocol during bolus administration of intravenous contrast. Multiplanar reconstructed images and MIPs were obtained and reviewed to evaluate the vascular anatomy. RADIATION DOSE REDUCTION: This exam was performed according to the departmental dose-optimization program which includes automated exposure control, adjustment of the mA and/or kV according to patient size and/or use of iterative reconstruction technique. CONTRAST:  OMNIPAQUE  IOHEXOL  350 MG/ML SOLN COMPARISON:  None Available. FINDINGS: VASCULAR Aorta: Normal caliber aorta without aneurysm, dissection, vasculitis or significant stenosis. Mild atherosclerotic calcification Celiac: Patent without evidence of aneurysm, dissection, vasculitis or significant stenosis. SMA: Patent without evidence of aneurysm, dissection, vasculitis or significant stenosis. Renals: Both renal arteries are patent without evidence of aneurysm, dissection, vasculitis, fibromuscular dysplasia or significant stenosis. IMA: Patent without evidence of aneurysm, dissection, vasculitis or significant stenosis. Inflow: Patent without evidence of aneurysm, dissection, vasculitis or significant stenosis. Proximal Outflow: Bilateral common femoral and  visualized portions of the superficial and profunda femoral arteries are patent without evidence of aneurysm, dissection, vasculitis or significant stenosis. Veins: Unremarkable Review of the MIP images confirms the above findings. NON-VASCULAR Lower chest: No acute abnormality. Global cardiac size within normal limits though left ventricular hypertrophy is noted. Hypoattenuation of the cardiac blood pool in keeping with at least mild anemia. Hepatobiliary: No focal liver abnormality is seen. No gallstones, gallbladder wall thickening, or biliary dilatation. Pancreas: Unremarkable Spleen: Unremarkable Adrenals/Urinary Tract: The adrenal glands are unremarkable. Tiny hypodensity within the interpolar region of the left kidney likely represent a tiny cortical cyst no follow-up imaging is recommended. The kidneys are otherwise unremarkable. There is mild distention of bladder and circumferential bladder wall thickening present suggesting changes of chronic bladder outlet obstruction. Stomach/Bowel: No active extravasation identified. Stomach, small bowel, and large bowel are unremarkable. Appendix normal. No free intraperitoneal gas or fluid. Lymphatic: No pathologic adenopathy within the abdomen pelvis. Reproductive: Marked prostatic hypertrophy Other: No abdominal wall hernia or abnormality. No abdominopelvic ascites. Musculoskeletal: No acute or significant osseous findings. IMPRESSION: 1. No active extravasation identified. 2. Left ventricular hypertrophy. 3. Mild anemia. 4. Marked prostatic hypertrophy with changes of chronic bladder outlet obstruction. 5. Aortic atherosclerosis. Aortic Atherosclerosis (ICD10-I70.0). Electronically Signed   By: Dorethia Molt M.D.   On: 08/14/2024 00:53   CT HEAD WO CONTRAST ( ) Result Date: 08/14/2024 EXAM: CT HEAD AND CERVICAL SPINE 08/14/2024 12:35:52 AM TECHNIQUE: CT of the head and cervical spine was performed without the administration of intravenous contrast.  Multiplanar reformatted images are provided for review. Automated exposure control, iterative reconstruction, and/or weight based adjustment of the mA/kV was utilized to reduce the radiation dose to as low as reasonably achievable. COMPARISON: None available. CLINICAL HISTORY: Syncope/presyncope, cerebrovascular cause suspected. Received call from telemetry patient leads off. Patient found down with bloody stools all over room and bathroom. Rapid response called. Patient stated he felt like he was going to pass out. Possibly lost consciousness. Unsure. 1000ml fluid bolus started. Patient transferred to ICU. FINDINGS: CT HEAD BRAIN AND VENTRICLES: No acute intracranial hemorrhage. No mass effect or midline shift. No abnormal extra-axial fluid collection. No evidence of acute infarct. No hydrocephalus. ORBITS: No acute abnormality. SINUSES AND MASTOIDS: Right maxillary sinus retention cyst. SOFT TISSUES  AND SKULL: No acute skull fracture. No acute soft tissue abnormality. CT CERVICAL SPINE BONES AND ALIGNMENT: No acute fracture or traumatic malalignment. DEGENERATIVE CHANGES: No significant degenerative changes. SOFT TISSUES: No prevertebral soft tissue swelling. IMPRESSION: 1. No acute intracranial abnormality. 2. No acute fracture or traumatic malalignment of the cervical spine. Electronically signed by: Franky Stanford MD 08/14/2024 12:45 AM EDT RP Workstation: [id number]   CT CERVICAL SPINE WO CONTRAST Result Date: 08/14/2024 EXAM: CT HEAD AND CERVICAL SPINE 08/14/2024 12:35:52 AM TECHNIQUE: CT of the head and cervical spine was performed without the administration of intravenous contrast. Multiplanar reformatted images are provided for review. Automated exposure control, iterative reconstruction, and/or weight based adjustment of the mA/kV was utilized to reduce the radiation dose to as low as reasonably achievable. COMPARISON: None available. CLINICAL HISTORY: Syncope/presyncope, cerebrovascular cause suspected.  Received call from telemetry patient leads off. Patient found down with bloody stools all over room and bathroom. Rapid response called. Patient stated he felt like he was going to pass out. Possibly lost consciousness. Unsure. 1000ml fluid bolus started. Patient transferred to ICU. FINDINGS: CT HEAD BRAIN AND VENTRICLES: No acute intracranial hemorrhage. No mass effect or midline shift. No abnormal extra-axial fluid collection. No evidence of acute infarct. No hydrocephalus. ORBITS: No acute abnormality. SINUSES AND MASTOIDS: Right maxillary sinus retention cyst. SOFT TISSUES AND SKULL: No acute skull fracture. No acute soft tissue abnormality. CT CERVICAL SPINE BONES AND ALIGNMENT: No acute fracture or traumatic malalignment. DEGENERATIVE CHANGES: No significant degenerative changes. SOFT TISSUES: No prevertebral soft tissue swelling. IMPRESSION: 1. No acute intracranial abnormality. 2. No acute fracture or traumatic malalignment of the cervical spine. Electronically signed by: Franky Stanford MD 08/14/2024 12:45 AM EDT RP Workstation: [id number]   ECHOCARDIOGRAM COMPLETE BUBBLE STUDY Result Date: 08/13/2024    ECHOCARDIOGRAM REPORT   Patient Name:   HONDO NANDA Date of Exam: 08/13/2024 Medical Rec #:  [id number]               Height:       60.0 in Accession #:    [id number]              Weight:       160.0 lb Date of Birth:  March 09, 1976               BSA:          1.698 m Patient Age:    48 years                BP:           138/99 mmHg Patient Gender: M                       HR:           82 bpm. Exam Location:  [facility] Procedure: 2D Echo, Color Doppler, Cardiac Doppler, Saline Contrast Bubble            Study, Strain Analysis and 3D Echo (Both Spectral and Color Flow            Doppler were utilized during procedure). Indications:     TIA 435.9 / G45.9  History:         Patient has no prior history of Echocardiogram examinations.                  Risk Factors:Hypertension.  Sonographer:     Christopher Furnace  Referring Phys:  [id number] JAN A MANSY Diagnosing Phys: Lonni End  MD  Sonographer Comments: Global longitudinal strain was attempted. IMPRESSIONS  1. Left ventricular ejection fraction, by estimation, is 35 to 40%. Left ventricular ejection fraction by 3D volume is 36 %. The left ventricle has moderately decreased function. The left ventricle demonstrates global hypokinesis. There is mild left ventricular hypertrophy. Left ventricular diastolic parameters are consistent with Grade I diastolic dysfunction (impaired relaxation). Elevated left atrial pressure. The average left ventricular global longitudinal strain is -4.4 %.  2. Right ventricular systolic function is normal. The right ventricular size is normal. Tricuspid regurgitation signal is inadequate for assessing PA pressure.  3. The mitral valve is normal in structure. No evidence of mitral valve regurgitation. No evidence of mitral stenosis.  4. The aortic valve has an indeterminant number of cusps. Aortic valve regurgitation is not visualized. No aortic stenosis is present.  5. Agitated saline contrast bubble study was negative, with no evidence of any interatrial shunt. FINDINGS  Left Ventricle: Left ventricular ejection fraction, by estimation, is 35 to 40%. Left ventricular ejection fraction by 3D volume is 36 %. The left ventricle has moderately decreased function. The left ventricle demonstrates global hypokinesis. The average left ventricular global longitudinal strain is -4.4 %. The left ventricular internal cavity size was normal in size. There is mild left ventricular hypertrophy. Left ventricular diastolic parameters are consistent with Grade I diastolic dysfunction (impaired relaxation). Elevated left atrial pressure. Right Ventricle: The right ventricular size is normal. No increase in right ventricular wall thickness. Right ventricular systolic function is normal. Tricuspid regurgitation signal is inadequate for assessing PA pressure. Left  Atrium: Left atrial size was normal in size. Right Atrium: Right atrial size was normal in size. Pericardium: There is no evidence of pericardial effusion. Mitral Valve: The mitral valve is normal in structure. No evidence of mitral valve regurgitation. No evidence of mitral valve stenosis. MV peak gradient, 2.5 mmHg. The mean mitral valve gradient is 1.0 mmHg. Tricuspid Valve: The tricuspid valve is normal in structure. Tricuspid valve regurgitation is trivial. Aortic Valve: The aortic valve has an indeterminant number of cusps. Aortic valve regurgitation is not visualized. No aortic stenosis is present. Aortic valve mean gradient measures 2.5 mmHg. Aortic valve peak gradient measures 5.2 mmHg. Aortic valve area, by VTI measures 2.16 cm. Pulmonic Valve: The pulmonic valve was not well visualized. Pulmonic valve regurgitation is not visualized. No evidence of pulmonic stenosis. Aorta: The aortic root is normal in size and structure. Venous: The inferior vena cava was not well visualized. IAS/Shunts: The interatrial septum was not well visualized. Agitated saline contrast was given intravenously to evaluate for intracardiac shunting. Agitated saline contrast bubble study was negative, with no evidence of any interatrial shunt.  LEFT VENTRICLE PLAX 2D LVIDd:         5.00 cm         Diastology LVIDs:         3.80 cm         LV e' medial:    3.37 cm/s LV PW:         1.10 cm         LV E/e' medial:  15.3 LV IVS:        1.20 cm         LV e' lateral:   3.37 cm/s LVOT diam:     2.20 cm         LV E/e' lateral: 15.3 LV SV:         30 LV SV Index:   17  2D Longitudinal LVOT Area:     3.80 cm        Strain                                2D Strain GLS   -4.4 %                                Avg: LV Volumes (MOD) LV vol d, MOD    72.7 ml       3D Volume EF A2C:                           LV 3D EF:    Left LV vol d, MOD    93.2 ml                    ventricul A4C:                                        ar LV vol s,  MOD    50.9 ml                    ejection A2C:                                        fraction LV vol s, MOD    62.9 ml                    by 3D A4C:                                        volume is LV SV MOD A2C:   21.8 ml                    36 %. LV SV MOD A4C:   93.2 ml LV SV MOD BP:    23.7 ml                                3D Volume EF:                                3D EF:        36 % RIGHT VENTRICLE RV Basal diam:  2.50 cm RV Mid diam:    1.80 cm RV S prime:     9.03 cm/s TAPSE (M-mode): 2.3 cm LEFT ATRIUM             Index        RIGHT ATRIUM           Index LA diam:        2.50 cm 1.47 cm/m   RA Area:     10.30 cm LA Vol (A2C):   24.8 ml 14.61 ml/m  RA Volume:   20.60 ml  12.13 ml/m LA Vol (A4C):   25.7 ml 15.14 ml/m LA Biplane Vol: 25.7 ml 15.14 ml/m  AORTIC VALVE AV Area (Vmax):    2.01 cm AV Area (Vmean):   2.06 cm AV Area (VTI):     2.16 cm AV Vmax:           113.50 cm/s AV Vmean:          76.000 cm/s AV VTI:            0.137 m AV Peak Grad:      5.2 mmHg AV Mean Grad:      2.5 mmHg LVOT Vmax:         59.90 cm/s LVOT Vmean:        41.100 cm/s LVOT VTI:          0.078 m LVOT/AV VTI ratio: 0.57  AORTA Ao Root diam: 3.60 cm MITRAL VALVE MV Area (PHT): 4.86 cm    SHUNTS MV Area VTI:   1.72 cm    Systemic VTI:  0.08 m MV Peak grad:  2.5 mmHg    Systemic Diam: 2.20 cm MV Mean grad:  1.0 mmHg MV Vmax:       0.80 m/s MV Vmean:      53.5 cm/s MV Decel Time: 156 msec MV E velocity: 51.40 cm/s MV A velocity: 67.10 cm/s MV E/A ratio:  0.77 Lonni End MD Electronically signed by Lonni Hanson MD Signature Date/Time: 08/13/2024/6:46:39 PM    Final    CT ANGIO HEAD NECK W WO CM Result Date: 08/13/2024 EXAM: CTA Head and Neck with Intravenous Contrast. CT Head without Contrast. CLINICAL HISTORY: Stroke, follow up. Stroke, follow up. TECHNIQUE: Axial CTA images of the head and neck performed with intravenous contrast. MIP reconstructed images were created and reviewed. Axial computed tomography images  of the head/brain performed without intravenous contrast. Note: Per PQRS, the description of internal carotid artery percent stenosis, including 0 percent or normal exam, is based on [place] American Symptomatic Carotid Endarterectomy Trial (NASCET) criteria. Dose reduction technique was used including one or more of the following: automated exposure control, adjustment of mA and kV according to patient size, and/or iterative reconstruction. CONTRAST: 75 mL iohexol  (OMNIPAQUE ) 350 MG/ML injection. COMPARISON: None provided. FINDINGS: CT HEAD: BRAIN: No acute intraparenchymal hemorrhage. No mass lesion. No CT evidence for acute territorial infarct. No midline shift or extra-axial collection. Grey-white matter differentiation is maintained. No edema, mass effect, or midline shift. VENTRICLES: No hydrocephalus. ORBITS: The orbits are unremarkable. SINUSES AND MASTOIDS: Large mucous retention cyst in the right maxillary sinus. CTA NECK: COMMON CAROTID ARTERIES: Mixed atherosclerotic plaque at the right carotid bifurcation without hemodynamically significant stenosis. Additional mixed atherosclerotic plaque at the left carotid bifurcation without hemodynamically significant stenosis. INTERNAL CAROTID ARTERIES: No stenosis by NASCET criteria. No dissection or occlusion. VERTEBRAL ARTERIES: No significant stenosis. No dissection or occlusion. CTA HEAD: ANTERIOR CEREBRAL ARTERIES: No significant stenosis. No occlusion. No aneurysm. MIDDLE CEREBRAL ARTERIES: No significant stenosis. No occlusion. No aneurysm. POSTERIOR CEREBRAL ARTERIES: No significant stenosis. No occlusion. No aneurysm. BASILAR ARTERY: No significant stenosis. No occlusion. No aneurysm. OTHER: Mild atherosclerosis along the left cavernous ICA without significant stenosis. The intracranial internal carotid artery is patent bilaterally. The middle cerebral arteries are patent bilaterally. The anterior cerebral arteries are patent bilaterally. SOFT TISSUES: No  acute finding. No masses or lymphadenopathy. BONES: No acute osseous abnormality. Degenerative changes in the visualized spine greatest at C4-5. IMPRESSION: 1. No acute intracranial abnormality. 2. No large vessel occlusion. 3. Mixed atherosclerotic plaque at the carotid bifurcations without hemodynamically significant stenosis. 4. Mild atherosclerosis along the  left cavernous ICA without significant stenosis. Electronically signed by: Donnice Mania MD 08/13/2024 05:07 PM EDT RP Workstation: [id number]   MR BRAIN WO CONTRAST Result Date: 08/13/2024 EXAM: MR Brain without Intravenous Contrast. CLINICAL HISTORY: Neuro deficit, acute, stroke suspected. Came in with untreated hypertension; patient states felt dizzy but does not know when it started. TECHNIQUE: Magnetic resonance images of the brain without intravenous contrast in multiple planes. CONTRAST: None. COMPARISON: None provided. FINDINGS: BRAIN: Minimal nonspecific white matter T2-weighted signal hyperintensities, which may be associated with early chronic small vessel disease or migraine headaches. No restricted diffusion to indicate acute infarction. No intracranial mass or hemorrhage. No midline shift or extra-axial fluid collection. No cerebellar tonsillar ectopia. The central arterial and venous flow voids are patent. VENTRICLES: No hydrocephalus. ORBITS: The orbits are normal. SINUSES AND MASTOIDS: Right maxillary sinus retention cyst. The remaining sinuses and mastoid air cells are clear. BONES: No acute fracture or focal osseous lesion. IMPRESSION: 1. No acute findings. 2. Minimal nonspecific white matter T2-weighted signal hyperintensities, possibly related to early chronic small vessel disease or migraine headaches. Electronically signed by: Franky Stanford MD 08/13/2024 12:34 AM EDT RP Workstation: [id number]   CT HEAD WO CONTRAST Result Date: 08/12/2024 EXAM: CT HEAD WITHOUT CONTRAST 08/12/2024 10:08:08 PM TECHNIQUE: CT of the head was performed  without the administration of intravenous contrast. Automated exposure control, iterative reconstruction, and/or weight based adjustment of the mA/kV was utilized to reduce the radiation dose to as low as reasonably achievable. COMPARISON: 10/29/2020 CLINICAL HISTORY: Neuro deficit, acute, stroke suspected; dysarthria for 1 day, eval for stroke. BIB ems for hypertension, has not been taking for the last 6 years. Per pt I think I might have had a stroke yesterday. I had a headache, my BP was up, I was having trouble getting words out, and I was dizzy. Pt unsure of the time of onset. All symptoms have resolved except hypertension. FINDINGS: BRAIN AND VENTRICLES: No acute hemorrhage. No evidence of acute infarct. No hydrocephalus. No extra-axial collection. No mass effect or midline shift. ORBITS: No acute abnormality. SINUSES: No acute abnormality. SOFT TISSUES AND SKULL: No acute soft tissue abnormality. No skull fracture. IMPRESSION: 1. No acute intracranial abnormality. Electronically signed by: Franky Stanford MD 08/12/2024 10:29 PM EDT RP Workstation: [id number]   @IMAGES @  Assessment: Acute gastrointestinal bleeding - Bleeding from below, but dark suggesting upper GI source. Ddx includes esophageal varices, PUD, Dieulafoy lesion, Upper GI malignancy (esophageal vs gastric neoplasm). Less likely small bowel source (meckel's diverticulum, vascular malformations, small bowel neoplasm, etc.). Also consider Lower GI source such as right sided colonic diverticula, ischemic colitis, SCAD, colon malignancy, etc. Patient appears stable at present after transfer to the ICU and IV octreotide . Post hemorrhagic anemia Uncontrolled hypertension with sequelae. Possible TIA - Per neurology, no ongoing cerebrovascular event. Recommended ASA 81mg  daily when feasible. Given only one dose of Plavix  yesterday during stroke workup, none prior to that. Acute HFrEF - No acute coronary events noted. Cardiology postponing workup  secondary to gastrointestinal bleeding. Alcohol abuse. THC and Cocaine positive UDS.     Recommendations:  Continue CIWA protocol as ordered. 2.  Clear liquid diet. 3. Continue IV octreotide , IV acid suppression. 4.  Serial H/H. 5.  Agree with holding Lovenox  for now.  6.  EGD. The patient understands the nature of the planned procedure, indications, risks, alternatives and potential complications including but not limited to bleeding, infection, perforation, damage to internal organs and possible oversedation/side effects from anesthesia. The patient agrees and gives  consent to proceed.  Please refer to procedure notes for findings, recommendations and patient disposition/instructions. 7.  If EGD negative, consider colonoscopy. 8. Will follow along with you.  Thank you for the consult. Please call with questions or concerns.  Aundria Ladell Eck MD [facility] [address] [place], [place] [address] [phone]  08/14/2024 8:26 AM

## 2024-08-14 NOTE — Significant Event (Signed)
 Rapid Response Event Note   Reason for Call :  Fall  Initial Focused Assessment:  Pt on floor bloody stool and emesis all over patient, room , and bathroom. Patient alert and oriented able to follow commands. Patient express that he is unsure if he hit his but his thinks he may have lost consciousness. BP read 150s/100 HR 100 RR 27 unable to get accurate O2 reading patient placed on NRB.  Dr Lawence at bedside Bolus of saline, labs, Scans and transfer orders placed for stepdown. Patient moved from floor to bed after Dr Lawence assessment of patient.  Patient Transferred to ICU Vitals on arrival BP 130/70 HR 108 RR 19 o2 98% on 2L.        MD Notified: Dr Lawence  Call [phone] Arrival [phone] End Upfz:[id number]  Fred LOISE Shams, RN

## 2024-08-14 NOTE — Progress Notes (Signed)
 Critical care note,  Date of note: 08/13/2024.  Subjective:Pt was found on floor with melanotic dark bloody stool and emesis all over patient, room , and bathroom. Patient was alert and oriented able to follow commands. Patient express that he is unsure if he hit his but his thinks he may have lost consciousness. BP read 150s/100 HR 100 RR 27 and later 94/63 with heart rate 101 respiratory rate of 18 and most recent temperature 98.3.  Accurate O2 reading patient placed on NRB.  IV normal saline bolus was started.  No chest pain or palpitations.  No fever or chills.  Objective: Physical examination: Generally: Acutely ill middle aged African-American male, laying on the ground and lethargic. Vital signs as above later on BP was 94/42 with a MAP of 55 and heart rate of 111 with respiratory rate of 23. Head - atraumatic, normocephalic.  Pupils - equal, round and reactive to light and accommodation. Extraocular movements are intact. No scleral icterus.  Oropharynx - moist mucous membranes and tongue. No pharyngeal erythema or exudate.  Neck - supple. No JVD. Carotid pulses 2+ bilaterally. No carotid bruits. No palpable thyromegaly or lymphadenopathy. Cardiovascular - regular rate and rhythm. Normal S1 and S2. No murmurs, gallops or rubs.  Lungs - clear to auscultation bilaterally.  Abdomen - soft and nontender. Positive bowel sounds. No palpable organomegaly or masses.  Extremities - no pitting edema, clubbing or cyanosis.  Neuro - grossly non-focal. Skin -melanotic stools all over his legs. GU and rectal exam - deferred.   Stat CMP revealed a BUN of 31 and calcium  8.4 with albumin 3.4 and total protein 6.4 blood glucose 182.  CBC showed hemoglobin 8.9 hematocrit 29.8 down from 11.4 and 37.9.  Stat head CT scan without contrast showed no acute intracranial normalities.  C-spine CT showed no acute findings GI bleeding scan showed no active extravasation identified, LVH, aortic atherosclerosis and  marked prostatic hypertrophy.  Labs and notes were reviewed.  Assessment/plan: 1.   Acute GI bleeding likely of upper etiology with melanotic stools and acute blood loss anemia with subsequent hypotension likely secondary to hypovolemia shock. - The patient was given a bolus of 1 L of IV normal saline followed by maintenance infusion. - He was immediately transferred to stepdown unit bed. - He was typed and crossmatched and was ordered 2 units of packed red blood cells. - GI consult will be obtained. - I notified Dr. Maryruth about the patient. - Given history of alcohol abuse I placed him on IV octreotide  with bolus and drip. - I gave him 80 mg bolus of IV Protonix  followed by 40 mg IV every 12 hours. - Will continue to closely monitor him.  Authorized and performed by: Madison Peaches, MD Total critical care time:  35      minutes. Due to a high probability of clinically significant, life-threatening deterioration, the patient required my highest level of preparedness to intervene emergently and I personally spent this critical care time directly and personally managing the patient.  This critical care time included obtaining a history, examining the patient, pulse oximetry, ordering and review of studies, arranging urgent treatment with development of management plan, evaluation of patient's response to treatment, frequent reassessment, and discussions with other providers. This critical care time was performed to assess and manage the high probability of imminent, life-threatening deterioration that could result in multiorgan failure.  It was exclusive of separately billable procedures and treating other patients and teaching time.

## 2024-08-14 NOTE — Assessment & Plan Note (Signed)
 UDS positive for cannabinoid and cocaine. - Counseling was provided

## 2024-08-14 NOTE — Telephone Encounter (Signed)
 Patient Product/process development scientist completed.    The patient is insured through [facility] [employer] [place].     Ran test claim for Entresto  24-26 mg and the current 30 day co-pay is $4.00.  Ran test claim for Farxiga 10 mg and Requires Prior Authorization  Ran test claim for Jardiance 10 mg and Requires Prior Authorization  This test claim was processed through [employer]- copay amounts may vary at other pharmacies due to [employer], or as the patient moves through the different stages of their insurance plan.     Morgan Arab, CPHT Pharmacy Technician III Certified Patient Advocate [facility] Patient Advocate Team Direct Number: [phone]  Fax: [phone]

## 2024-08-14 NOTE — Progress Notes (Signed)
 Received call from telemetry patient leads off. Patient found down with bloody stools all over room and bathroom. Rapid response called. Patient stated he felt like he was going to pass out. Possibly lost consciousness. Unsure. 1000ml fluid bolus started. Patient transferred to icu 11.

## 2024-08-15 ENCOUNTER — Inpatient Hospital Stay: Admitting: Anesthesiology

## 2024-08-15 ENCOUNTER — Encounter: Payer: Self-pay | Admitting: Family Medicine

## 2024-08-15 ENCOUNTER — Encounter
Admission: EM | Disposition: A | Discharge status: Home / Self Care | Payer: Self-pay | Source: Home / Self Care | Attending: Internal Medicine

## 2024-08-15 DIAGNOSIS — R471 Dysarthria and anarthria: Secondary | ICD-10-CM | POA: Diagnosis not present

## 2024-08-15 DIAGNOSIS — F1010 Alcohol abuse, uncomplicated: Secondary | ICD-10-CM | POA: Diagnosis not present

## 2024-08-15 DIAGNOSIS — I429 Cardiomyopathy, unspecified: Secondary | ICD-10-CM | POA: Diagnosis not present

## 2024-08-15 DIAGNOSIS — G459 Transient cerebral ischemic attack, unspecified: Secondary | ICD-10-CM | POA: Diagnosis not present

## 2024-08-15 DIAGNOSIS — F1910 Other psychoactive substance abuse, uncomplicated: Secondary | ICD-10-CM | POA: Diagnosis not present

## 2024-08-15 DIAGNOSIS — I420 Dilated cardiomyopathy: Secondary | ICD-10-CM

## 2024-08-15 DIAGNOSIS — R2990 Unspecified symptoms and signs involving the nervous system: Secondary | ICD-10-CM | POA: Diagnosis not present

## 2024-08-15 DIAGNOSIS — Z91148 Patient's other noncompliance with medication regimen for other reason: Secondary | ICD-10-CM

## 2024-08-15 DIAGNOSIS — I160 Hypertensive urgency: Secondary | ICD-10-CM | POA: Diagnosis not present

## 2024-08-15 DIAGNOSIS — I5020 Unspecified systolic (congestive) heart failure: Secondary | ICD-10-CM | POA: Diagnosis not present

## 2024-08-15 HISTORY — PX: ESOPHAGOGASTRODUODENOSCOPY: SHX5428

## 2024-08-15 LAB — BASIC METABOLIC PANEL WITH GFR
Anion gap: 7 (ref 5–15)
BUN: 15 mg/dL (ref 6–20)
CO2: 22 mmol/L (ref 22–32)
Calcium: 8.5 mg/dL — ABNORMAL LOW (ref 8.9–10.3)
Chloride: 110 mmol/L (ref 98–111)
Creatinine, Ser: 1.07 mg/dL (ref 0.61–1.24)
GFR, Estimated: >60 mL/min (ref >60)
Glucose, Bld: 92 mg/dL (ref 70–99)
Potassium: 3.7 mmol/L (ref 3.5–5.1)
Sodium: 139 mmol/L (ref 135–145)

## 2024-08-15 LAB — HEMOGLOBIN AND HEMATOCRIT, BLOOD
HCT: 29.7 % — ABNORMAL LOW (ref 39.0–52.0)
HCT: 33.5 % — ABNORMAL LOW (ref 39.0–52.0)
Hemoglobin: 9.3 g/dL — ABNORMAL LOW (ref 13.0–17.0)
Hemoglobin: 10.4 g/dL — ABNORMAL LOW (ref 13.0–17.0)

## 2024-08-15 LAB — BPAM RBC
Blood Product Expiration Date: 202509292359
Blood Product Expiration Date: 202510022359
ISSUE DATE / TIME: 202509030144
ISSUE DATE / TIME: 202509030542
Unit Type and Rh: 5100
Unit Type and Rh: 5100

## 2024-08-15 LAB — TYPE AND SCREEN
ABO/RH(D): O POS
Unit division: 0
Unit division: 0

## 2024-08-15 LAB — CBC
HCT: 30.8 % — ABNORMAL LOW (ref 39.0–52.0)
Hemoglobin: 9.9 g/dL — ABNORMAL LOW (ref 13.0–17.0)
MCH: 25.2 pg — ABNORMAL LOW (ref 26.0–34.0)
MCHC: 32.1 g/dL (ref 30.0–36.0)
MCV: 78.4 fL — ABNORMAL LOW (ref 80.0–100.0)
Platelets: 243 K/uL (ref 150–400)
RBC: 3.93 MIL/uL — ABNORMAL LOW (ref 4.22–5.81)
RDW: 18.3 % — ABNORMAL HIGH (ref 11.5–15.5)
WBC: 10.9 K/uL — ABNORMAL HIGH (ref 4.0–10.5)
nRBC: 0 % (ref 0.0–0.2)

## 2024-08-15 SURGERY — EGD (ESOPHAGOGASTRODUODENOSCOPY)
Anesthesia: General

## 2024-08-15 MED ORDER — SACUBITRIL-VALSARTAN 49-51 MG PO TABS
1.0000 | ORAL_TABLET | Freq: Two times a day (BID) | ORAL | Status: DC
  Administered 2024-08-15 – 2024-08-16 (×3): 1 via ORAL
  Filled 2024-08-15 (×3): qty 1

## 2024-08-15 MED ORDER — FENTANYL CITRATE (PF) 100 MCG/2ML IJ SOLN
INTRAMUSCULAR | Status: DC | PRN
  Administered 2024-08-15: 100 ug via INTRAVENOUS

## 2024-08-15 MED ORDER — PROPOFOL 10 MG/ML IV BOLUS
INTRAVENOUS | Status: AC
Start: 2024-08-15 — End: 2024-08-15
  Filled 2024-08-15: qty 40

## 2024-08-15 MED ORDER — SODIUM CHLORIDE 0.9 % IV SOLN: INTRAVENOUS | Status: DC

## 2024-08-15 MED ORDER — SPIRONOLACTONE 12.5 MG HALF TABLET
12.5000 mg | ORAL_TABLET | Freq: Every day | ORAL | Status: DC
  Administered 2024-08-15 – 2024-08-16 (×2): 12.5 mg via ORAL
  Filled 2024-08-15 (×2): qty 1

## 2024-08-15 MED ORDER — PROPOFOL 10 MG/ML IV BOLUS
INTRAVENOUS | Status: DC | PRN
  Administered 2024-08-15: 30 mg via INTRAVENOUS
  Administered 2024-08-15: 70 mg via INTRAVENOUS

## 2024-08-15 MED ORDER — FENTANYL CITRATE (PF) 100 MCG/2ML IJ SOLN
INTRAMUSCULAR | Status: AC
  Filled 2024-08-15: qty 2

## 2024-08-15 MED ORDER — LOSARTAN POTASSIUM 50 MG PO TABS
75.0000 mg | ORAL_TABLET | Freq: Every day | ORAL | Status: AC
  Administered 2024-08-15: 75 mg via ORAL
  Filled 2024-08-15: qty 1

## 2024-08-15 NOTE — Transfer of Care (Signed)
 Immediate Anesthesia Transfer of Care Note  Patient: Fred [name]  Procedure(s) Performed: EGD (ESOPHAGOGASTRODUODENOSCOPY)  Patient Location: PACU  Anesthesia Type:General  Level of Consciousness: alert   Airway & Oxygen Therapy: Patient Spontanous Breathing and Patient connected to face mask oxygen  Post-op Assessment: Report given to RN and Post -op Vital signs reviewed and stable  Post vital signs: Reviewed and stable  Last Vitals:  Vitals Value Taken Time  BP 160/92 08/15/24 12:56  Temp 35.4 C 08/15/24 12:56  Pulse 82 08/15/24 12:56  Resp 16 08/15/24 12:56  SpO2 100 % 08/15/24 12:56    Last Pain:  Vitals:   08/15/24 1256  TempSrc: Tympanic  PainSc: 0-No pain         Complications: No notable events documented.

## 2024-08-15 NOTE — Op Note (Signed)
 [facility] Patient Name: Fred [name] Procedure Date: 08/15/2024 12:31 PM MRN: [id number] Account #: [id number] Date of Birth: 29-May-1976 Admit Type: Inpatient Age: 48 Room: [facility] ENDO ROOM 1 Gender: Male Note Status: Finalized Instrument Name: Upper GI Scope [id number] Procedure:             Upper GI endoscopy Indications:           Melena, Recent gastrointestinal bleeding Providers:             Aden Sek K. Aundria MD, MD Referring MD:          No Local Md, MD (Referring MD) Medicines:             Propofol  per Anesthesia Complications:         No immediate complications. Procedure:             Pre-Anesthesia Assessment:                        - The risks and benefits of the procedure and the                         sedation options and risks were discussed with the                         patient. All questions were answered and informed                         consent was obtained.                        - Patient identification and proposed procedure were                         verified prior to the procedure by the nurse. The                         procedure was verified in the procedure room.                        - ASA Grade Assessment: III - A patient with severe                         systemic disease.                        - After reviewing the risks and benefits, the patient                         was deemed in satisfactory condition to undergo the                         procedure.                        After obtaining informed consent, the endoscope was                         passed under direct vision. Throughout the procedure,  the patient's blood pressure, pulse, and oxygen                         saturations were monitored continuously. The Endoscope                         was introduced through the mouth, and advanced to the                         third part of duodenum. The upper GI endoscopy was                          accomplished without difficulty. The patient tolerated                         the procedure well. Findings:      The examined esophagus was normal.      Localized minimal inflammation characterized by erythema was found in       the gastric antrum. Biopsies were taken with a cold forceps for       Helicobacter pylori testing. Estimated blood loss was minimal.      No other significant abnormalities were identified in a careful       examination of the stomach.      Localized moderately congested mucosa without active bleeding and with       no stigmata of bleeding was found in the duodenal bulb.      One non-bleeding cratered duodenal ulcer with pigmented material was       found in the duodenal bulb. The lesion was 16 mm in largest dimension.       Estimated blood loss: none.      The second portion of the duodenum and third portion of the duodenum       were normal. Impression:            - Normal esophagus.                        - Gastritis. Biopsied.                        - Congested duodenal mucosa.                        - Non-bleeding duodenal ulcer with pigmented material.                        - Normal second portion of the duodenum and third                         portion of the duodenum. Recommendation:        - Return patient to ICU possible transfer to medical                         bed from a GI standpoint.                        - Advance diet as tolerated.                        - Discontinue Octreotide                         -  Use Protonix  (pantoprazole ) 40 mg PO BID for 1                         month. Followed by one pill daily x 2 months.                        - Await pathology results. Eradication regimen for H                         Pylori if POSITIVE.                        - [employer] will sign off for now. Call us  back if any                         changes clinically or if we can help for any reason.                        - The  findings and recommendations were discussed with                         the patient. Procedure Code(s):     --- Professional ---                        [id number], Esophagogastroduodenoscopy, flexible,                         transoral; with biopsy, single or multiple Diagnosis Code(s):     --- Professional ---                        K92.2, Gastrointestinal hemorrhage, unspecified                        K92.1, Melena (includes Hematochezia)                        K26.9, Duodenal ulcer, unspecified as acute or                         chronic, without hemorrhage or perforation                        K31.89, Other diseases of stomach and duodenum                        K29.70, Gastritis, unspecified, without bleeding CPT copyright 2022 American Medical Association. All rights reserved. The codes documented in this report are preliminary and upon coder review may  be revised to meet current compliance requirements. Ladell MARLA Boss MD, MD 08/15/2024 12:53:57 PM This report has been signed electronically. Number of Addenda: 0 Note Initiated On: 08/15/2024 12:31 PM Estimated Blood Loss:  Estimated blood loss was minimal.      [facility]

## 2024-08-15 NOTE — Assessment & Plan Note (Signed)
 Likely secondary to being noncompliant, patient was not taking his medications at home. Blood pressure improved but still elevated -Losartan  dose was increased to 75 mg daily and now he is being transition to Entresto  - Continue amlodipine   -Carvedilol  was added today by cardiology

## 2024-08-15 NOTE — Anesthesia Preprocedure Evaluation (Addendum)
 Anesthesia Evaluation  Patient identified by MRN, date of birth, ID band Patient awake    Reviewed: Allergy & Precautions, H&P , NPO status , Patient's Chart, lab work & pertinent test results  Airway Mallampati: III  TM Distance: >3 FB Neck ROM: full    Dental  (+) Missing, Poor Dentition   Pulmonary Current Smoker and Patient abstained from smoking.   Pulmonary exam normal        Cardiovascular hypertension, +CHF  Normal cardiovascular exam  ECHO :  1. Left ventricular ejection fraction, by estimation, is 35 to 40%. Left  ventricular ejection fraction by 3D volume is 36 %. The left ventricle has  moderately decreased function. The left ventricle demonstrates global  hypokinesis. There is mild left  ventricular hypertrophy. Left ventricular diastolic parameters are  consistent with Grade I diastolic dysfunction (impaired relaxation).  Elevated left atrial pressure. The average left ventricular global  longitudinal strain is -4.4 %.   2. Right ventricular systolic function is normal. The right ventricular  size is normal. Tricuspid regurgitation signal is inadequate for assessing  PA pressure.   3. The mitral valve is normal in structure. No evidence of mitral valve  regurgitation. No evidence of mitral stenosis.   4. The aortic valve has an indeterminant number of cusps. Aortic valve  regurgitation is not visualized. No aortic stenosis is present.   5. Agitated saline contrast bubble study was negative, with no evidence  of any interatrial shunt.     Neuro/Psych TIA negative psych ROS   GI/Hepatic negative GI ROS,,,(+)     substance abuse  alcohol use  Endo/Other  negative endocrine ROS    Renal/GU negative Renal ROS  negative genitourinary   Musculoskeletal   Abdominal Normal abdominal exam  (+)   Peds  Hematology negative hematology ROS (+)   Anesthesia Other Findings Pt admitted with Hypertensive  urgency, TIA. During hospitalization he had a GI bleed. The UDS is cocaine positive.  He has a history of drinking 2 bottles of liquor daily.  Past Medical History: No date: Hypertension No date: Medical history non-contributory No date: Scoliosis  Past Surgical History: No date: NO PAST SURGERIES  BMI    Body Mass Index: 30.70 kg/m      Reproductive/Obstetrics negative OB ROS                              Anesthesia Physical Anesthesia Plan  ASA: 4  Anesthesia Plan: General   Post-op Pain Management:    Induction: Intravenous  PONV Risk Score and Plan: Propofol  infusion and TIVA  Airway Management Planned: Natural Airway and Nasal Cannula  Additional Equipment:   Intra-op Plan:   Post-operative Plan:   Informed Consent: I have reviewed the patients History and Physical, chart, labs and discussed the procedure including the risks, benefits and alternatives for the proposed anesthesia with the patient or authorized representative who has indicated his/her understanding and acceptance.     Dental Advisory Given  Plan Discussed with: CRNA and Surgeon  Anesthesia Plan Comments:          Anesthesia Quick Evaluation

## 2024-08-15 NOTE — Progress Notes (Signed)
 Heart Failure Nurse Navigator Progress Note  PCP: Pcp, No PCP-Cardiologist: [facility] New Patient Admission Diagnosis: Dysarthria Stroke-like episode Admitted from: Home via EMS.    Presentation:   Fred [name] presented with a lightheaded feeling, felt like he could not speak words correctly and a very mild headache.  He started to feel better but came in for concerns of just not feeling right.  Reported like he might have had a stroke. Has not had any facial weakness or numbness.  No difficulty walking or using his arms or legs.  Symptoms started the day before and he was not able to pin down the exact time but it was in the afternoon- greater than 24 hours. HS-Troponin 28. UDS cannabinoid and cocaine.  BP 145/98, Pulse 86, SPO2 100%. Stat head CT nonacute results.  ECHO/ LVEF: 35-40%  Clinical Course:  Past Medical History:  Diagnosis Date   Hypertension    Medical history non-contributory    Scoliosis      Social History   Socioeconomic History   Marital status: Single    Spouse name: Not on file   Number of children: 1   Years of education: Not on file   Highest education level: 12th grade  Occupational History   Not on file  Tobacco Use   Smoking status: Every Day    Types: Cigarettes   Smokeless tobacco: Never   Tobacco comments:    pt refused  Substance and Sexual Activity   Alcohol use: Yes    Alcohol/week: 2.0 standard drinks of alcohol    Types: 2 Cans of beer per week    Comment: 2 Beers a day   Drug use: Yes    Frequency: 1.0 times per week    Types: Marijuana   Sexual activity: Yes  Other Topics Concern   Not on file  Social History Narrative   Not on file   Social Drivers of Health   Financial Resource Strain: Low Risk  (08/15/2024)   Overall Financial Resource Strain (CARDIA)    Difficulty of Paying Living Expenses: Not hard at all  Food Insecurity: No Food Insecurity (08/13/2024)   Hunger Vital Sign    Worried About Running Out of Food  in the Last Year: Never true    Ran Out of Food in the Last Year: Never true  Transportation Needs: No Transportation Needs (08/13/2024)   PRAPARE - Administrator, Civil Service (Medical): No    Lack of Transportation (Non-Medical): No  Physical Activity: Not on file  Stress: Not on file  Social Connections: Not on file   Education Assessment and Provision:  Detailed education and instructions provided on heart failure disease management including the following:  Signs and symptoms of Heart Failure When to call the physician Importance of daily weights Low sodium diet Fluid restriction Medication management Anticipated future follow-up appointments  Patient education given on each of the above topics.  Patient acknowledges understanding via teach back method and acceptance of all instructions.  Education Materials:  Living Better With Heart Failure Booklet, HF zone tool, & Daily Weight Tracker Tool.  Patient has scale at home: No. Will provide him with one for discharge. Patient has pill box at home: No. Will provide him with one for discharge.    High Risk Criteria for Readmission and/or Poor Patient Outcomes: Heart failure hospital admissions (last 6 months): 1  No Show rate: 0% Difficult social situation: ETOH & Drug use Demonstrates medication adherence: New HF Diagnosis Primary  Language: English Literacy level: Reading, Writing & Comprehension  Barriers of Care:   New HF Medications ETOH & Drug use  Considerations/Referrals:   Referral made to Heart Failure Pharmacist Stewardship: No Referral made to Heart Failure CSW/NCM TOC: No Referral made to Heart & Vascular TOC clinic: Yes. 08/23/24 @ 9:30 AM Heart Failure Clinic @ [facility]  Items for Follow-up on DC/TOC: Diet & Fluid Restrictions Daily Weights Continued Heart Failure Education  Charmaine Pines, RN, BSN [facility] Heart Failure Navigator Secure Chat Only

## 2024-08-15 NOTE — Progress Notes (Signed)
 Progress Note   Patient: Fred [name] FMW:[id number] DOB: [birth year] DOA: 08/12/2024     1 DOS: the patient was seen and examined on 08/15/2024   Brief hospital course: Partly taken from prior notes.  Fred [name] is a 48 y.o. African-American male with medical history significant for essential hypertension with noncompliance to medications for 5 years, and likely alcohol abuse, who presented to the emergency room with acute onset of lightheadedness yesterday afternoon with dysarthria and mild headache.   Patient was found to have elevated blood pressure on admission and was admitted for concern of TIA.  Symptoms resolved by the time of admission.  Neurology started on DAPT with aspirin  and Plavix .  9/3: Overnight patient was found on floor surrounded by large melanotic stool, he was hypotensive and tachycardic, was given a bolus with improvement in blood pressure.  Hemoglobin decreased to 8.9 from 11.4.  CTA GI bleed was negative for any active bleeding.  CT head and cervical spine was obtained to rule out any trauma and they were negative for any acute abnormalities.  GI was consulted, patient was started on IV Protonix  and octreotide  due to his history of alcohol abuse. Aspirin  and Plavix  are being held.  Going for EGD and colonoscopy tomorrow.  Also consulting cardiology for new diagnosis of HFrEF.  UDS was positive for cocaine and cannabinoid.  9/4: Blood pressure remained elevated, increasing the dose of losartan  to 75.  Cardiology is recommending outpatient ischemic workup.  EGD today with a nonbleeding duodenal ulcer with pigmented material and gastritis-biopsies were taken.  GI is recommending twice daily PPI for 1 month followed by daily for next 2 months.  They will follow-up on pathology results.  Octreotide  was discontinued as there was no concern of varices.  Assessment and Plan: * Hypertensive urgency Likely secondary to being noncompliant, patient was not  taking his medications at home. Blood pressure improved but still elevated -Losartan  dose was increased to 75 mg daily and now he is being transition to Entresto  - Continue amlodipine   -Carvedilol  was added today by cardiology  TIA (transient ischemic attack) Likely TIA as all imaging was negative for any acute lesion, MRI with concern of chronic small vessel disease.  CTA head and neck was negative for any LVO.  A1c of 5.4, elevated total cholesterol at 202, LDL 110 and HDL of 84.  Echocardiogram with new diagnosis of HFrEF, EF of 35 to 40% and grade 1 diastolic dysfunction. - Patient was started on aspirin  and Plavix  as recommended by neurology which is being held now due to GI bleed. - Continue with statin   Acute HFrEF (heart failure with reduced ejection fraction) (HCC) No chest pain or complaint of dyspnea.  Echocardiogram with EF of 30 to 35% and grade 1 diastolic dysfunction, global hypokinesis. Bubble study was negative. Troponin was not done as there was no concern on admission. Cardiology was consulted-they will do ischemic workup with cardiac cath as outpatient due to concern of GI bleeding now. -Low-dose carvedilol  was added - Losartan  is being switched with Entresto  -Spironolactone  at 12.5 mg daily added  GI bleed Patient overnight had a large melanotic stool and was found on floor surrounded by it.  Hemoglobin decreased to 8.9 from 11.4.  No more bleeding. Hemoglobin now improved to 10.4 GI was consulted and patient had EGD which shows a nonbleeding duodenal ulcer with pigmented material and gastritis-biopsies were taken. -GI is recommending PPI twice daily for next 1 month -Octreotide  was discontinued as there  was no concern of varices -Monitor hemoglobin  Alcohol abuse - Continue with CIWA protocol -Counseling was provided  Substance abuse (HCC) UDS positive for cannabinoid and cocaine. - Counseling was provided   Subjective: Patient was resting comfortably when  seen today.  Denies any more melena.  No chest pain or shortness of breath.  Physical Exam: Vitals:   08/15/24 1315 08/15/24 1322 08/15/24 1351 08/15/24 1634  BP: (!) 155/100 (!) 157/99 (!) 156/91 (!) 152/105  Pulse: 84 83 75 65  Resp: 16 16 16 20   Temp:    (!) 97.3 F (36.3 C)  TempSrc:      SpO2: 100% 100% 100% 100%  Weight:      Height:       General.  Well-developed gentleman, in no acute distress. Pulmonary.  Lungs clear bilaterally, normal respiratory effort. CV.  Regular rate and rhythm, no JVD, rub or murmur. Abdomen.  Soft, nontender, nondistended, BS positive. CNS.  Alert and oriented .  No focal neurologic deficit. Extremities.  No edema, no cyanosis, pulses intact and symmetrical. Psychiatry.  Judgment and insight appears normal.   Data Reviewed: Prior data reviewed  Family Communication: Discussed with patient  Disposition: Status is: Inpatient  inpatient because: Severity of illness  Planned Discharge Destination: Home  DVT prophylaxis.  Lovenox  Time spent: 50 minutes  This record has been created using Conservation officer, historic buildings. Errors have been sought and corrected,but may not always be located. Such creation errors do not reflect on the standard of care.   Author: Amaryllis Dare, MD 08/15/2024 5:28 PM  For on call review [email or web address].

## 2024-08-15 NOTE — OR Nursing (Signed)
 Pt arrived to ENDO for procedure. No family present with pt in the hospital

## 2024-08-15 NOTE — Anesthesia Postprocedure Evaluation (Signed)
 Anesthesia Post Note  Patient: Fred [name]  Procedure(s) Performed: EGD (ESOPHAGOGASTRODUODENOSCOPY)  Patient location during evaluation: PACU Anesthesia Type: General Level of consciousness: awake and alert Pain management: pain level controlled Vital Signs Assessment: post-procedure vital signs reviewed and stable Respiratory status: spontaneous breathing, nonlabored ventilation and respiratory function stable Cardiovascular status: blood pressure returned to baseline and stable Postop Assessment: no apparent nausea or vomiting Anesthetic complications: no   No notable events documented.   Last Vitals:  Vitals:   08/15/24 1750 08/15/24 1941  BP: (!) 149/87 (!) 131/90  Pulse: 71 80  Resp:  18  Temp:  36.7 C  SpO2:  100%    Last Pain:  Vitals:   08/15/24 1351  TempSrc:   PainSc: 0-No pain                 Camellia Merilee Louder

## 2024-08-15 NOTE — Assessment & Plan Note (Signed)
 Patient overnight had a large melanotic stool and was found on floor surrounded by it.  Hemoglobin decreased to 8.9 from 11.4.  No more bleeding. Hemoglobin now improved to 10.4 GI was consulted and patient had EGD which shows a nonbleeding duodenal ulcer with pigmented material and gastritis-biopsies were taken. -GI is recommending PPI twice daily for next 1 month -Octreotide  was discontinued as there was no concern of varices -Monitor hemoglobin

## 2024-08-15 NOTE — Progress Notes (Signed)
  Progress Note  Patient Name: Fred [name] Date of Encounter: 08/15/2024 [facility] Cardiologist: New  Interval Summary    Plan for EGD/colonoscopy today. Patient reports he had trouble sleeping. He denies chest pain or SOB. Hgb 9.9. he denies bleeding issues.  Vital Signs Vitals:   08/14/24 2118 08/15/24 0009 08/15/24 0404 08/15/24 0817  BP: (!) 156/87 (!) 146/89 (!) 159/93 (!) 176/103  Pulse: 83 92 93 86  Resp: 20 18 18 18   Temp: 98.2 F (36.8 C) 98.3 F (36.8 C) 98.4 F (36.9 C) 98 F (36.7 C)  TempSrc:    Oral  SpO2: 100% 100% 100% 99%  Weight:      Height:        Intake/Output Summary (Last 24 hours) at 08/15/2024 0829 Last data filed at 08/15/2024 0454 Gross per 24 hour  Intake 2779.44 ml  Output 2675 ml  Net 104.44 ml      08/13/2024   11:36 PM 08/12/2024    9:40 PM 11/11/2020   10:10 AM  Last 3 Weights  Weight (lbs) 157 lb 3 oz 160 lb 154 lb 15.7 oz  Weight (kg) 71.3 kg 72.576 kg 70.3 kg      Telemetry/ECG  NSR 80-90s - Personally Reviewed  Physical Exam  GEN: No acute distress.   Neck: No JVD Cardiac: RRR, no murmurs, rubs, or gallops.  Respiratory: Clear to auscultation bilaterally. GI: Soft, nontender, non-distended  MS: No edema  Assessment & Plan   New HFrEF - admitted with TIA, hypertensive urgency and GIB found to have low EF - echo bubble study for stroke showed LVEF 35-40%, G1DD - no prior cardiac work-up - CM may be from hypertension vs alcohol - patient is not volume up on exam - start Coreg  12.5mg BID and Losartan  50mg  daily>will increase to 75mg  daily - can switch to Entresto  tomorrow - start spiro 12.5mg  daily - not a good candidate for cath given anemia and noncompliance. Medication compliance discussed today. - may be able to cath as outpatient or consider Cardiac CTA as OP   Hypertensive urgency - apparently he was not taking medications at home - Coreg  12.5mg BID, Losartan  50mg  daily, spiro 12.5mg  daily -  amlodipine  held for low EF - BP still high - plan to switch to Entresto  tomorrow   GIB Cardiac pre-op evaluation - Hgb went down to 8.9>>9.9 - GI planning on colonoscopy and EGD - patient is stable from a cardiac perspective. No further work-up prior to procedure.  - METs>4 - RCRI= 5% risk of MACE  TIA - imaging was all negative - ASA and Plavix  held for GIB - neurology is following - continue statin therapy   Alcohol Abuse - CIWA per IM   Substance abuse - UDS showed cannabinoid and cocaine    For questions or updates, please contact [facility] Please consult [email or web address] for contact info under       Signed, Avyay Coger VEAR Fishman, PA-C

## 2024-08-15 NOTE — Interval H&P Note (Signed)
 History and Physical Interval Note:  08/15/2024 12:37 PM  Fred [name]  has presented today for surgery, with the diagnosis of acute gastrointestinal bleeding.  The various methods of treatment have been discussed with the patient and family. After consideration of risks, benefits and other options for treatment, the patient has consented to  Procedure(s): EGD (ESOPHAGOGASTRODUODENOSCOPY) (N/A) as a surgical intervention.  The patient's history has been reviewed, patient examined, no change in status, stable for surgery.  I have reviewed the patient's chart and labs.  Questions were answered to the patient's satisfaction.     Urich, [name]

## 2024-08-15 NOTE — Assessment & Plan Note (Signed)
 No chest pain or complaint of dyspnea.  Echocardiogram with EF of 30 to 35% and grade 1 diastolic dysfunction, global hypokinesis. Bubble study was negative. Troponin was not done as there was no concern on admission. Cardiology was consulted-they will do ischemic workup with cardiac cath as outpatient due to concern of GI bleeding now. -Low-dose carvedilol  was added - Losartan  is being switched with Entresto  -Spironolactone  at 12.5 mg daily added

## 2024-08-16 ENCOUNTER — Other Ambulatory Visit: Payer: Self-pay

## 2024-08-16 ENCOUNTER — Other Ambulatory Visit: Payer: Self-pay | Admitting: Emergency Medicine

## 2024-08-16 DIAGNOSIS — I10 Essential (primary) hypertension: Secondary | ICD-10-CM

## 2024-08-16 DIAGNOSIS — I420 Dilated cardiomyopathy: Secondary | ICD-10-CM

## 2024-08-16 DIAGNOSIS — G459 Transient cerebral ischemic attack, unspecified: Secondary | ICD-10-CM | POA: Diagnosis not present

## 2024-08-16 DIAGNOSIS — R471 Dysarthria and anarthria: Secondary | ICD-10-CM | POA: Diagnosis not present

## 2024-08-16 DIAGNOSIS — I5021 Acute systolic (congestive) heart failure: Secondary | ICD-10-CM

## 2024-08-16 DIAGNOSIS — I160 Hypertensive urgency: Secondary | ICD-10-CM | POA: Diagnosis not present

## 2024-08-16 DIAGNOSIS — R2990 Unspecified symptoms and signs involving the nervous system: Secondary | ICD-10-CM | POA: Diagnosis not present

## 2024-08-16 LAB — CBC
HCT: 31.3 % — ABNORMAL LOW (ref 39.0–52.0)
Hemoglobin: 9.9 g/dL — ABNORMAL LOW (ref 13.0–17.0)
MCH: 25.1 pg — ABNORMAL LOW (ref 26.0–34.0)
MCHC: 31.6 g/dL (ref 30.0–36.0)
MCV: 79.2 fL — ABNORMAL LOW (ref 80.0–100.0)
Platelets: 256 K/uL (ref 150–400)
RBC: 3.95 MIL/uL — ABNORMAL LOW (ref 4.22–5.81)
RDW: 18.5 % — ABNORMAL HIGH (ref 11.5–15.5)
WBC: 9.2 K/uL (ref 4.0–10.5)
nRBC: 0 % (ref 0.0–0.2)

## 2024-08-16 LAB — BASIC METABOLIC PANEL WITH GFR
Anion gap: 6 (ref 5–15)
BUN: 11 mg/dL (ref 6–20)
CO2: 24 mmol/L (ref 22–32)
Calcium: 8.4 mg/dL — ABNORMAL LOW (ref 8.9–10.3)
Chloride: 106 mmol/L (ref 98–111)
Creatinine, Ser: 1.12 mg/dL (ref 0.61–1.24)
GFR, Estimated: >60 mL/min (ref >60)
Glucose, Bld: 119 mg/dL — ABNORMAL HIGH (ref 70–99)
Potassium: 3.2 mmol/L — ABNORMAL LOW (ref 3.5–5.1)
Sodium: 136 mmol/L (ref 135–145)

## 2024-08-16 LAB — SURGICAL PATHOLOGY

## 2024-08-16 MED ORDER — ATORVASTATIN CALCIUM 40 MG PO TABS
40.0000 mg | ORAL_TABLET | Freq: Every day | ORAL | 2 refills | Status: DC
  Filled 2024-08-16: qty 90, 90d supply, fill #0

## 2024-08-16 MED ORDER — PANTOPRAZOLE SODIUM 40 MG PO TBEC
40.0000 mg | DELAYED_RELEASE_TABLET | Freq: Two times a day (BID) | ORAL | 2 refills | Status: AC
  Filled 2024-08-16: qty 60, 30d supply, fill #0

## 2024-08-16 MED ORDER — SPIRONOLACTONE 25 MG PO TABS
12.5000 mg | ORAL_TABLET | Freq: Every day | ORAL | 1 refills | Status: DC
  Filled 2024-08-16: qty 30, 60d supply, fill #0

## 2024-08-16 MED ORDER — SACUBITRIL-VALSARTAN 49-51 MG PO TABS
1.0000 | ORAL_TABLET | Freq: Two times a day (BID) | ORAL | 2 refills | Status: DC
  Filled 2024-08-16: qty 60, 30d supply, fill #0

## 2024-08-16 MED ORDER — DAPAGLIFLOZIN PROPANEDIOL 10 MG PO TABS
10.0000 mg | ORAL_TABLET | Freq: Every day | ORAL | Status: DC
  Administered 2024-08-16: 10 mg via ORAL
  Filled 2024-08-16: qty 1

## 2024-08-16 MED ORDER — CARVEDILOL 12.5 MG PO TABS
12.5000 mg | ORAL_TABLET | Freq: Two times a day (BID) | ORAL | 2 refills | Status: DC
  Filled 2024-08-16: qty 60, 30d supply, fill #0

## 2024-08-16 MED ORDER — ADULT MULTIVITAMIN W/MINERALS CH
1.0000 | ORAL_TABLET | Freq: Every day | ORAL | 1 refills | Status: AC
  Filled 2024-08-16: qty 90, 90d supply, fill #0

## 2024-08-16 MED ORDER — FOLIC ACID 1 MG PO TABS
1.0000 mg | ORAL_TABLET | Freq: Every day | ORAL | 0 refills | Status: AC
  Filled 2024-08-16: qty 90, 90d supply, fill #0

## 2024-08-16 MED ORDER — DAPAGLIFLOZIN PROPANEDIOL 10 MG PO TABS
10.0000 mg | ORAL_TABLET | Freq: Every day | ORAL | 1 refills | Status: DC
  Filled 2024-08-16: qty 90, 90d supply, fill #0

## 2024-08-16 MED ORDER — POTASSIUM CHLORIDE CRYS ER 20 MEQ PO TBCR
40.0000 meq | EXTENDED_RELEASE_TABLET | Freq: Once | ORAL | Status: AC
  Administered 2024-08-16: 40 meq via ORAL
  Filled 2024-08-16: qty 2

## 2024-08-16 MED ORDER — FERROUS SULFATE 325 (65 FE) MG PO TBEC
325.0000 mg | DELAYED_RELEASE_TABLET | Freq: Two times a day (BID) | ORAL | 3 refills | Status: AC
  Filled 2024-08-16: qty 30, 15d supply, fill #0

## 2024-08-16 MED ORDER — VITAMIN B-1 100 MG PO TABS
100.0000 mg | ORAL_TABLET | Freq: Every day | ORAL | 1 refills | Status: AC
  Filled 2024-08-16: qty 90, 90d supply, fill #0

## 2024-08-16 MED ORDER — PANTOPRAZOLE SODIUM 40 MG PO TBEC
40.0000 mg | DELAYED_RELEASE_TABLET | Freq: Two times a day (BID) | ORAL | Status: DC
  Administered 2024-08-16: 40 mg via ORAL
  Filled 2024-08-16: qty 1

## 2024-08-16 NOTE — TOC Transition Note (Signed)
 Transition of Care [facility]) - Discharge Note   Patient Details  Name: Saed Hudlow MRN: [id number] Date of Birth: 20-Sep-1976  [facility] CM/SW Contact:  Lauraine JAYSON Carpen, LCSW Phone Number: 08/16/2024, 3:19 PM   Clinical Narrative:  Patient has orders to discharge home today. No further concerns. CSW signing off.   Final next level of care: Home/Self Care Barriers to Discharge: Barriers Resolved   Patient Goals and CMS Choice            Discharge Placement                    Patient and family notified of of transfer: 08/16/24  Discharge Plan and Services Additional resources added to the After Visit Summary for       Post Acute Care Choice: NA                               Social Drivers of Health (SDOH) Interventions SDOH Screenings   Food Insecurity: No Food Insecurity (08/13/2024)  Housing: Low Risk  (08/13/2024)  Transportation Needs: No Transportation Needs (08/13/2024)  Utilities: Not At Risk (08/13/2024)  Alcohol Screen: Low Risk  (12/14/2018)  Financial Resource Strain: Low Risk  (08/15/2024)  Tobacco Use: High Risk (08/15/2024)     Readmission Risk Interventions     No data to display

## 2024-08-16 NOTE — Progress Notes (Signed)
  Progress Note  Patient Name: Fred [name] Date of Encounter: 08/16/2024 [facility] Cardiologist: Evalene Lunger, MD   Interval Summary    AM labs pending. BP improving but still high. Patient denies chest pain. He does feel SOB when he gets up.   EGD showed non-bleeding duodenal ulcer and gastritis  Vital Signs Vitals:   08/15/24 1941 08/16/24 0003 08/16/24 0309 08/16/24 0727  BP: (!) 131/90 (!) 154/77 139/74 (!) 159/93  Pulse: 80 63 72 79  Resp: 18 18 17    Temp: 98.1 F (36.7 C) 98.3 F (36.8 C) 98.8 F (37.1 C) 97.7 F (36.5 C)  TempSrc: Oral Oral Oral   SpO2: 100% 100% 100% 100%  Weight:      Height:        Intake/Output Summary (Last 24 hours) at 08/16/2024 0807 Last data filed at 08/16/2024 0003 Gross per 24 hour  Intake 278.27 ml  Output 1800 ml  Net -1521.73 ml      08/13/2024   11:36 PM 08/12/2024    9:40 PM 11/11/2020   10:10 AM  Last 3 Weights  Weight (lbs) 157 lb 3 oz 160 lb 154 lb 15.7 oz  Weight (kg) 71.3 kg 72.576 kg 70.3 kg      Telemetry/ECG  NSR HR 60-70s - Personally Reviewed  Physical Exam  GEN: No acute distress.   Neck: No JVD Cardiac: RRR, no murmurs, rubs, or gallops.  Respiratory: Clear to auscultation bilaterally. GI: Soft, nontender, non-distended  MS: No edema  Assessment & Plan   New HFrEF - admitted with TIA, hypertensive urgency and GIB found to have low EF - echo bubble study for stroke showed LVEF 35-40%, G1DD - no prior cardiac work-up - CM may be from hypertension vs alcohol, cannot exclude CAD - patient is not volume up on exam - continue Coreg  12.5mg BID  - transition Losartan  to Entresto  49-51mg BID - start spiro 12.5mg  daily - If kidney function is stable can start SGLT2i - not a good candidate for cath given anemia, drug use, and noncompliance. Medication compliance discussed today. - may be able to Cardiac CTA as OP   Hypertensive urgency - apparently he was not taking medications at home -  Coreg  12.5mg BID, Entresto  49-51mg BID, spiro 12.5mg  daily - BP better - can increase Entresto  tomorrow if BP still high   GIB Cardiac pre-op evaluation - Hgb went down to 8.9>>9.9>>10.4 - GI performed EGD which showed nonbleeding duodenal ulcer with pigmented material and gastritis - PPI   TIA - imaging was all negative - ASA and Plavix  held for GIB - neurology is following - continue statin therapy   Alcohol Abuse - CIWA per IM   Substance abuse - UDS showed cannabinoid and cocaine      For questions or updates, please contact [facility] Please consult [email or web address] for contact info under       Signed, Alannis Hsia VEAR Fishman, PA-C

## 2024-08-16 NOTE — Progress Notes (Signed)
 Heart Failure Navigator Progress Note  Nurse Navigator delivered a scale, pill box and transportation resource handout to patient at the bedside.  Reviewed daily weights & pill organizer for Medication Compliance and patient understands all education provided.  Navigator will sign off at this time.  Charmaine Pines, RN, BSN [facility] Heart Failure Navigator Secure Chat Only

## 2024-08-16 NOTE — Progress Notes (Signed)
   08/16/24 1100  Therapy Vitals  BP (!) 149/87  Mobility  Activity Ambulated with assistance;Stood at bedside  Level of Assistance Contact guard assist, steadying assist  Assistive Device Front wheel walker  Distance Ambulated (ft) 185 ft  Range of Motion/Exercises Active Assistive  Activity Response Tolerated well  Mobility Referral Yes  Mobility visit 1 Mobility  Mobility Specialist Start Time (ACUTE ONLY) 1050  Mobility Specialist Stop Time (ACUTE ONLY) 1112  Mobility Specialist Time Calculation (min) (ACUTE ONLY) 22 min   Mobility Specialist - Progress Note  Pt was in bed upon arrival. Pt BP initial 149/87. Pt indicated he was ready to ambulate. Pt ambulated well and stated no pain. Pt O2 was checked midway through ambulation at 98%. Pt returned to the bed with bed alarm on and needs in reach. BP checked before the end of session 157/100.  Clem Rodes Mobility Specialist 08/16/24, 11:43 AM

## 2024-08-16 NOTE — Discharge Summary (Signed)
 Physician Discharge Summary   Patient: Fred [name] MRN: [id number] DOB: [birth year]  Admit date:     08/12/2024  Discharge date: 08/16/24  Discharge Physician: Amaryllis Dare   PCP: Pcp, No   Recommendations at discharge:  Please obtain CBC and BMP on follow-up Follow-up with cardiology Follow-up with gastroenterology Follow-up with primary care provider  Discharge Diagnoses: Principal Problem:   Hypertensive urgency Active Problems:   TIA (transient ischemic attack)   Acute HFrEF (heart failure with reduced ejection fraction) (HCC)   GI bleed   Alcohol abuse   Substance abuse (HCC)   Essential hypertension   Dysarthria   Stroke-like episode   Dilated cardiomyopathy (HCC)   H/O medication noncompliance   Hospital Course: Partly taken from prior notes.  Fred [name] is a 48 y.o. African-American male with medical history significant for essential hypertension with noncompliance to medications for 5 years, and likely alcohol abuse, who presented to the emergency room with acute onset of lightheadedness yesterday afternoon with dysarthria and mild headache.   Patient was found to have elevated blood pressure on admission and was admitted for concern of TIA.  Symptoms resolved by the time of admission.  Neurology started on DAPT with aspirin  and Plavix .  9/3: Overnight patient was found on floor surrounded by large melanotic stool, he was hypotensive and tachycardic, was given a bolus with improvement in blood pressure.  Hemoglobin decreased to 8.9 from 11.4.  CTA GI bleed was negative for any active bleeding.  CT head and cervical spine was obtained to rule out any trauma and they were negative for any acute abnormalities.  GI was consulted, patient was started on IV Protonix  and octreotide  due to his history of alcohol abuse. Aspirin  and Plavix  are being held.  Going for EGD and colonoscopy tomorrow.  Also consulting cardiology for new diagnosis of HFrEF.  UDS  was positive for cocaine and cannabinoid.  9/4: Blood pressure remained elevated, increasing the dose of losartan  to 75.  Cardiology is recommending outpatient ischemic workup.  EGD today with a nonbleeding duodenal ulcer with pigmented material and gastritis-biopsies were taken.  GI is recommending twice daily PPI for 1 month followed by daily for next 2 months.  They will follow-up on pathology results.  Octreotide  was discontinued as there was no concern of varices.  9/5: Blood pressure improving but still mildly elevated.  Losartan  was switched with Entresto .  Did not had any more GI bleeding, hemoglobin stable at 9.9, mild hypokalemia 3.2 which is being repleted.  Cardiology will do a close outpatient follow-up and ischemic workup as outpatient. Patient was again counseled for alcohol and substance abuse especially the cocaine due to new diagnosis of HFrEF.  He was also counseled to stay compliant with his medications to avoid further complications, if he continue with being noncompliant and continue to use cocaine and alcohol he will be very high risk for deterioration and mortality.  Patient was given Protonix  to be used twice daily for 1 month and then he will do daily.  He need to have a close follow-up with his gastroenterologist for further assistance.  Patient will continue on current medications and follow-up as outpatient with his providers for further assistance.  Assessment and Plan: * Hypertensive urgency Likely secondary to being noncompliant, patient was not taking his medications at home. Blood pressure improved but still elevated -Losartan  was switched with Entresto  - Amlodipine  was discontinued due to new diagnosis of HFrEF -Spironolactone  was added -Carvedilol  was added today by cardiology  TIA (transient ischemic attack) Likely TIA as all imaging was negative for any acute lesion, MRI with concern of chronic small vessel disease.  CTA head and neck was negative for any  LVO.  A1c of 5.4, elevated total cholesterol at 202, LDL 110 and HDL of 84.  Echocardiogram with new diagnosis of HFrEF, EF of 35 to 40% and grade 1 diastolic dysfunction. - Continue with statin   Acute HFrEF (heart failure with reduced ejection fraction) (HCC) No chest pain or complaint of dyspnea.  Echocardiogram with EF of 30 to 35% and grade 1 diastolic dysfunction, global hypokinesis. Bubble study was negative. Troponin was not done as there was no concern on admission. Cardiology was consulted-they will do ischemic workup with cardiac cath as outpatient due to concern of GI bleeding now. -Low-dose carvedilol  was added - Losartan  is being switched with Entresto  -Spironolactone  at 12.5 mg daily added  GI bleed Patient overnight had a large melanotic stool and was found on floor surrounded by it.  Hemoglobin decreased to 8.9 from 11.4.  No more bleeding. Hemoglobin now improved to 9.9 and seems stable GI was consulted and patient had EGD which shows a nonbleeding duodenal ulcer with pigmented material and gastritis-biopsies were taken. -GI is recommending PPI twice daily for next 1 month -Octreotide  was discontinued as there was no concern of varices -Monitor hemoglobin  Alcohol abuse - Continue with CIWA protocol -Counseling was provided  Substance abuse (HCC) UDS positive for cannabinoid and cocaine. - Counseling was provided  Consultants: Gastroenterology.  Cardiology Procedures performed: EGD Disposition: Home Diet recommendation:  Discharge Diet Orders (From admission, onward)     Start     Ordered   08/16/24 0000  Diet - low sodium heart healthy        08/16/24 1448           Cardiac diet DISCHARGE MEDICATION: Allergies as of 08/16/2024   No Known Allergies      Medication List     STOP taking these medications    amLODipine  5 MG tablet Commonly known as: NORVASC    lisinopril  10 MG tablet Commonly known as: ZESTRIL    thiamine  100 MG  tablet Commonly known as: VITAMIN B1       TAKE these medications    atorvastatin  40 MG tablet Commonly known as: LIPITOR Take 1 tablet (40 mg total) by mouth daily. Start taking on: August 17, 2024   carvedilol  12.5 MG tablet Commonly known as: COREG  Take 1 tablet (12.5 mg total) by mouth 2 (two) times daily with a meal.   dapagliflozin  propanediol 10 MG Tabs tablet Commonly known as: FARXIGA  Take 1 tablet (10 mg total) by mouth daily. Start taking on: August 17, 2024   ferrous sulfate  325 (65 FE) MG EC tablet Take 1 tablet (325 mg total) by mouth 2 (two) times daily.   folic acid  1 MG tablet Commonly known as: FOLVITE  Take 1 tablet (1 mg total) by mouth daily. Start taking on: August 17, 2024   multivitamin with minerals Tabs tablet Take 1 tablet by mouth daily. Start taking on: August 17, 2024   naltrexone  50 MG tablet Commonly known as: DEPADE Take 1 tablet (50 mg total) by mouth daily.   pantoprazole  40 MG tablet Commonly known as: PROTONIX  Take 1 tablet (40 mg total) by mouth 2 (two) times daily before a meal.   sacubitril -valsartan  49-51 MG Commonly known as: ENTRESTO  Take 1 tablet by mouth 2 (two) times daily.   spironolactone  25 MG tablet  Commonly known as: ALDACTONE  Take 0.5 tablets (12.5 mg total) by mouth daily. Start taking on: August 17, 2024   thiamine  100 MG tablet Commonly known as: Vitamin B-1 Take 1 tablet (100 mg total) by mouth daily. Start taking on: August 17, 2024        Follow-up Information     [facility]. Go on 08/23/2024.   Specialty: Cardiology Why: Hospital Follow-Up 08/23/24 @ 9:30 AM Please bring all medications to follow-up appointment [facility], [address] Floor Free Valet Parking at the door Contact information: [address] [place]  [address] [phone]        Aundria Ladell POUR, MD. Schedule an  appointment as soon as possible for a visit in 1 month(s).   Specialty: Gastroenterology Contact information: [address] [place] [address] [phone]                Discharge Exam: Fredricka Weights   08/12/24 2140 08/13/24 2336  Weight: 72.6 kg 71.3 kg   General.  Well-developed gentleman, in no acute distress. Pulmonary.  Lungs clear bilaterally, normal respiratory effort. CV.  Regular rate and rhythm, no JVD, rub or murmur. Abdomen.  Soft, nontender, nondistended, BS positive. CNS.  Alert and oriented .  No focal neurologic deficit. Extremities.  No edema, no cyanosis, pulses intact and symmetrical.  Condition at discharge: stable  The results of significant diagnostics from this hospitalization (including imaging, microbiology, ancillary and laboratory) are listed below for reference.   Imaging Studies: CT ANGIO GI BLEED Result Date: 08/14/2024 CLINICAL DATA:  Abdominal trauma, blunt, hematochezia, found down EXAM: CTA ABDOMEN AND PELVIS WITHOUT AND WITH CONTRAST TECHNIQUE: Multidetector CT imaging of the abdomen and pelvis was performed using the standard protocol during bolus administration of intravenous contrast. Multiplanar reconstructed images and MIPs were obtained and reviewed to evaluate the vascular anatomy. RADIATION DOSE REDUCTION: This exam was performed according to the departmental dose-optimization program which includes automated exposure control, adjustment of the mA and/or kV according to patient size and/or use of iterative reconstruction technique. CONTRAST:  OMNIPAQUE  IOHEXOL  350 MG/ML SOLN COMPARISON:  None Available. FINDINGS: VASCULAR Aorta: Normal caliber aorta without aneurysm, dissection, vasculitis or significant stenosis. Mild atherosclerotic calcification Celiac: Patent without evidence of aneurysm, dissection, vasculitis or significant stenosis. SMA: Patent without evidence of aneurysm, dissection, vasculitis or significant  stenosis. Renals: Both renal arteries are patent without evidence of aneurysm, dissection, vasculitis, fibromuscular dysplasia or significant stenosis. IMA: Patent without evidence of aneurysm, dissection, vasculitis or significant stenosis. Inflow: Patent without evidence of aneurysm, dissection, vasculitis or significant stenosis. Proximal Outflow: Bilateral common femoral and visualized portions of the superficial and profunda femoral arteries are patent without evidence of aneurysm, dissection, vasculitis or significant stenosis. Veins: Unremarkable Review of the MIP images confirms the above findings. NON-VASCULAR Lower chest: No acute abnormality. Global cardiac size within normal limits though left ventricular hypertrophy is noted. Hypoattenuation of the cardiac blood pool in keeping with at least mild anemia. Hepatobiliary: No focal liver abnormality is seen. No gallstones, gallbladder wall thickening, or biliary dilatation. Pancreas: Unremarkable Spleen: Unremarkable Adrenals/Urinary Tract: The adrenal glands are unremarkable. Tiny hypodensity within the interpolar region of the left kidney likely represent a tiny cortical cyst no follow-up imaging is recommended. The kidneys are otherwise unremarkable. There is mild distention of bladder and circumferential bladder wall thickening present suggesting changes of chronic bladder outlet obstruction. Stomach/Bowel: No active extravasation identified. Stomach, small bowel, and  large bowel are unremarkable. Appendix normal. No free intraperitoneal gas or fluid. Lymphatic: No pathologic adenopathy within the abdomen pelvis. Reproductive: Marked prostatic hypertrophy Other: No abdominal wall hernia or abnormality. No abdominopelvic ascites. Musculoskeletal: No acute or significant osseous findings. IMPRESSION: 1. No active extravasation identified. 2. Left ventricular hypertrophy. 3. Mild anemia. 4. Marked prostatic hypertrophy with changes of chronic bladder  outlet obstruction. 5. Aortic atherosclerosis. Aortic Atherosclerosis (ICD10-I70.0). Electronically Signed   By: Dorethia Molt M.D.   On: 08/14/2024 00:53   CT HEAD WO CONTRAST ( ) Result Date: 08/14/2024 EXAM: CT HEAD AND CERVICAL SPINE 08/14/2024 12:35:52 AM TECHNIQUE: CT of the head and cervical spine was performed without the administration of intravenous contrast. Multiplanar reformatted images are provided for review. Automated exposure control, iterative reconstruction, and/or weight based adjustment of the mA/kV was utilized to reduce the radiation dose to as low as reasonably achievable. COMPARISON: None available. CLINICAL HISTORY: Syncope/presyncope, cerebrovascular cause suspected. Received call from telemetry patient leads off. Patient found down with bloody stools all over room and bathroom. Rapid response called. Patient stated he felt like he was going to pass out. Possibly lost consciousness. Unsure. 1000ml fluid bolus started. Patient transferred to ICU. FINDINGS: CT HEAD BRAIN AND VENTRICLES: No acute intracranial hemorrhage. No mass effect or midline shift. No abnormal extra-axial fluid collection. No evidence of acute infarct. No hydrocephalus. ORBITS: No acute abnormality. SINUSES AND MASTOIDS: Right maxillary sinus retention cyst. SOFT TISSUES AND SKULL: No acute skull fracture. No acute soft tissue abnormality. CT CERVICAL SPINE BONES AND ALIGNMENT: No acute fracture or traumatic malalignment. DEGENERATIVE CHANGES: No significant degenerative changes. SOFT TISSUES: No prevertebral soft tissue swelling. IMPRESSION: 1. No acute intracranial abnormality. 2. No acute fracture or traumatic malalignment of the cervical spine. Electronically signed by: Franky Stanford MD 08/14/2024 12:45 AM EDT RP Workstation: [id number]   CT CERVICAL SPINE WO CONTRAST Result Date: 08/14/2024 EXAM: CT HEAD AND CERVICAL SPINE 08/14/2024 12:35:52 AM TECHNIQUE: CT of the head and cervical spine was performed  without the administration of intravenous contrast. Multiplanar reformatted images are provided for review. Automated exposure control, iterative reconstruction, and/or weight based adjustment of the mA/kV was utilized to reduce the radiation dose to as low as reasonably achievable. COMPARISON: None available. CLINICAL HISTORY: Syncope/presyncope, cerebrovascular cause suspected. Received call from telemetry patient leads off. Patient found down with bloody stools all over room and bathroom. Rapid response called. Patient stated he felt like he was going to pass out. Possibly lost consciousness. Unsure. 1000ml fluid bolus started. Patient transferred to ICU. FINDINGS: CT HEAD BRAIN AND VENTRICLES: No acute intracranial hemorrhage. No mass effect or midline shift. No abnormal extra-axial fluid collection. No evidence of acute infarct. No hydrocephalus. ORBITS: No acute abnormality. SINUSES AND MASTOIDS: Right maxillary sinus retention cyst. SOFT TISSUES AND SKULL: No acute skull fracture. No acute soft tissue abnormality. CT CERVICAL SPINE BONES AND ALIGNMENT: No acute fracture or traumatic malalignment. DEGENERATIVE CHANGES: No significant degenerative changes. SOFT TISSUES: No prevertebral soft tissue swelling. IMPRESSION: 1. No acute intracranial abnormality. 2. No acute fracture or traumatic malalignment of the cervical spine. Electronically signed by: Franky Stanford MD 08/14/2024 12:45 AM EDT RP Workstation: [id number]   ECHOCARDIOGRAM COMPLETE BUBBLE STUDY Result Date: 08/13/2024    ECHOCARDIOGRAM REPORT   Patient Name:   Fred [name] Date of Exam: 08/13/2024 Medical Rec #:  [id number]               Height:       60.0 in Accession #:  [id number]              Weight:       160.0 lb Date of Birth:  [birth year]               BSA:          1.698 m Patient Age:    48 years                BP:           138/99 mmHg Patient Gender: M                       HR:           82 bpm. Exam Location:  [facility] Procedure:  2D Echo, Color Doppler, Cardiac Doppler, Saline Contrast Bubble            Study, Strain Analysis and 3D Echo (Both Spectral and Color Flow            Doppler were utilized during procedure). Indications:     TIA 435.9 / G45.9  History:         Patient has no prior history of Echocardiogram examinations.                  Risk Factors:Hypertension.  Sonographer:     Christopher Furnace Referring Phys:  [id number] JAN A MANSY Diagnosing Phys: Lonni Hanson MD  Sonographer Comments: Global longitudinal strain was attempted. IMPRESSIONS  1. Left ventricular ejection fraction, by estimation, is 35 to 40%. Left ventricular ejection fraction by 3D volume is 36 %. The left ventricle has moderately decreased function. The left ventricle demonstrates global hypokinesis. There is mild left ventricular hypertrophy. Left ventricular diastolic parameters are consistent with Grade I diastolic dysfunction (impaired relaxation). Elevated left atrial pressure. The average left ventricular global longitudinal strain is -4.4 %.  2. Right ventricular systolic function is normal. The right ventricular size is normal. Tricuspid regurgitation signal is inadequate for assessing PA pressure.  3. The mitral valve is normal in structure. No evidence of mitral valve regurgitation. No evidence of mitral stenosis.  4. The aortic valve has an indeterminant number of cusps. Aortic valve regurgitation is not visualized. No aortic stenosis is present.  5. Agitated saline contrast bubble study was negative, with no evidence of any interatrial shunt. FINDINGS  Left Ventricle: Left ventricular ejection fraction, by estimation, is 35 to 40%. Left ventricular ejection fraction by 3D volume is 36 %. The left ventricle has moderately decreased function. The left ventricle demonstrates global hypokinesis. The average left ventricular global longitudinal strain is -4.4 %. The left ventricular internal cavity size was normal in size. There is mild left ventricular  hypertrophy. Left ventricular diastolic parameters are consistent with Grade I diastolic dysfunction (impaired relaxation). Elevated left atrial pressure. Right Ventricle: The right ventricular size is normal. No increase in right ventricular wall thickness. Right ventricular systolic function is normal. Tricuspid regurgitation signal is inadequate for assessing PA pressure. Left Atrium: Left atrial size was normal in size. Right Atrium: Right atrial size was normal in size. Pericardium: There is no evidence of pericardial effusion. Mitral Valve: The mitral valve is normal in structure. No evidence of mitral valve regurgitation. No evidence of mitral valve stenosis. MV peak gradient, 2.5 mmHg. The mean mitral valve gradient is 1.0 mmHg. Tricuspid Valve: The tricuspid valve is normal in structure. Tricuspid valve regurgitation is trivial. Aortic Valve: The aortic valve has an indeterminant number of cusps.  Aortic valve regurgitation is not visualized. No aortic stenosis is present. Aortic valve mean gradient measures 2.5 mmHg. Aortic valve peak gradient measures 5.2 mmHg. Aortic valve area, by VTI measures 2.16 cm. Pulmonic Valve: The pulmonic valve was not well visualized. Pulmonic valve regurgitation is not visualized. No evidence of pulmonic stenosis. Aorta: The aortic root is normal in size and structure. Venous: The inferior vena cava was not well visualized. IAS/Shunts: The interatrial septum was not well visualized. Agitated saline contrast was given intravenously to evaluate for intracardiac shunting. Agitated saline contrast bubble study was negative, with no evidence of any interatrial shunt.  LEFT VENTRICLE PLAX 2D LVIDd:         5.00 cm         Diastology LVIDs:         3.80 cm         LV e' medial:    3.37 cm/s LV PW:         1.10 cm         LV E/e' medial:  15.3 LV IVS:        1.20 cm         LV e' lateral:   3.37 cm/s LVOT diam:     2.20 cm         LV E/e' lateral: 15.3 LV SV:         30 LV SV Index:    17              2D Longitudinal LVOT Area:     3.80 cm        Strain                                2D Strain GLS   -4.4 %                                Avg: LV Volumes (MOD) LV vol d, MOD    72.7 ml       3D Volume EF A2C:                           LV 3D EF:    Left LV vol d, MOD    93.2 ml                    ventricul A4C:                                        ar LV vol s, MOD    50.9 ml                    ejection A2C:                                        fraction LV vol s, MOD    62.9 ml                    by 3D A4C:  volume is LV SV MOD A2C:   21.8 ml                    36 %. LV SV MOD A4C:   93.2 ml LV SV MOD BP:    23.7 ml                                3D Volume EF:                                3D EF:        36 % RIGHT VENTRICLE RV Basal diam:  2.50 cm RV Mid diam:    1.80 cm RV S prime:     9.03 cm/s TAPSE (M-mode): 2.3 cm LEFT ATRIUM             Index        RIGHT ATRIUM           Index LA diam:        2.50 cm 1.47 cm/m   RA Area:     10.30 cm LA Vol (A2C):   24.8 ml 14.61 ml/m  RA Volume:   20.60 ml  12.13 ml/m LA Vol (A4C):   25.7 ml 15.14 ml/m LA Biplane Vol: 25.7 ml 15.14 ml/m  AORTIC VALVE AV Area (Vmax):    2.01 cm AV Area (Vmean):   2.06 cm AV Area (VTI):     2.16 cm AV Vmax:           113.50 cm/s AV Vmean:          76.000 cm/s AV VTI:            0.137 m AV Peak Grad:      5.2 mmHg AV Mean Grad:      2.5 mmHg LVOT Vmax:         59.90 cm/s LVOT Vmean:        41.100 cm/s LVOT VTI:          0.078 m LVOT/AV VTI ratio: 0.57  AORTA Ao Root diam: 3.60 cm MITRAL VALVE MV Area (PHT): 4.86 cm    SHUNTS MV Area VTI:   1.72 cm    Systemic VTI:  0.08 m MV Peak grad:  2.5 mmHg    Systemic Diam: 2.20 cm MV Mean grad:  1.0 mmHg MV Vmax:       0.80 m/s MV Vmean:      53.5 cm/s MV Decel Time: 156 msec MV E velocity: 51.40 cm/s MV A velocity: 67.10 cm/s MV E/A ratio:  0.77 Lonni End MD Electronically signed by Lonni Hanson MD Signature Date/Time:  08/13/2024/6:46:39 PM    Final    CT ANGIO HEAD NECK W WO CM Result Date: 08/13/2024 EXAM: CTA Head and Neck with Intravenous Contrast. CT Head without Contrast. CLINICAL HISTORY: Stroke, follow up. Stroke, follow up. TECHNIQUE: Axial CTA images of the head and neck performed with intravenous contrast. MIP reconstructed images were created and reviewed. Axial computed tomography images of the head/brain performed without intravenous contrast. Note: Per PQRS, the description of internal carotid artery percent stenosis, including 0 percent or normal exam, is based on [place] American Symptomatic Carotid Endarterectomy Trial (NASCET) criteria. Dose reduction technique was used including one or more of the following: automated exposure control, adjustment of mA and kV according to patient size,  and/or iterative reconstruction. CONTRAST: 75 mL iohexol  (OMNIPAQUE ) 350 MG/ML injection. COMPARISON: None provided. FINDINGS: CT HEAD: BRAIN: No acute intraparenchymal hemorrhage. No mass lesion. No CT evidence for acute territorial infarct. No midline shift or extra-axial collection. Grey-white matter differentiation is maintained. No edema, mass effect, or midline shift. VENTRICLES: No hydrocephalus. ORBITS: The orbits are unremarkable. SINUSES AND MASTOIDS: Large mucous retention cyst in the right maxillary sinus. CTA NECK: COMMON CAROTID ARTERIES: Mixed atherosclerotic plaque at the right carotid bifurcation without hemodynamically significant stenosis. Additional mixed atherosclerotic plaque at the left carotid bifurcation without hemodynamically significant stenosis. INTERNAL CAROTID ARTERIES: No stenosis by NASCET criteria. No dissection or occlusion. VERTEBRAL ARTERIES: No significant stenosis. No dissection or occlusion. CTA HEAD: ANTERIOR CEREBRAL ARTERIES: No significant stenosis. No occlusion. No aneurysm. MIDDLE CEREBRAL ARTERIES: No significant stenosis. No occlusion. No aneurysm. POSTERIOR CEREBRAL ARTERIES: No  significant stenosis. No occlusion. No aneurysm. BASILAR ARTERY: No significant stenosis. No occlusion. No aneurysm. OTHER: Mild atherosclerosis along the left cavernous ICA without significant stenosis. The intracranial internal carotid artery is patent bilaterally. The middle cerebral arteries are patent bilaterally. The anterior cerebral arteries are patent bilaterally. SOFT TISSUES: No acute finding. No masses or lymphadenopathy. BONES: No acute osseous abnormality. Degenerative changes in the visualized spine greatest at C4-5. IMPRESSION: 1. No acute intracranial abnormality. 2. No large vessel occlusion. 3. Mixed atherosclerotic plaque at the carotid bifurcations without hemodynamically significant stenosis. 4. Mild atherosclerosis along the left cavernous ICA without significant stenosis. Electronically signed by: Donnice Mania MD 08/13/2024 05:07 PM EDT RP Workstation: [id number]   MR BRAIN WO CONTRAST Result Date: 08/13/2024 EXAM: MR Brain without Intravenous Contrast. CLINICAL HISTORY: Neuro deficit, acute, stroke suspected. Came in with untreated hypertension; patient states felt dizzy but does not know when it started. TECHNIQUE: Magnetic resonance images of the brain without intravenous contrast in multiple planes. CONTRAST: None. COMPARISON: None provided. FINDINGS: BRAIN: Minimal nonspecific white matter T2-weighted signal hyperintensities, which may be associated with early chronic small vessel disease or migraine headaches. No restricted diffusion to indicate acute infarction. No intracranial mass or hemorrhage. No midline shift or extra-axial fluid collection. No cerebellar tonsillar ectopia. The central arterial and venous flow voids are patent. VENTRICLES: No hydrocephalus. ORBITS: The orbits are normal. SINUSES AND MASTOIDS: Right maxillary sinus retention cyst. The remaining sinuses and mastoid air cells are clear. BONES: No acute fracture or focal osseous lesion. IMPRESSION: 1. No acute  findings. 2. Minimal nonspecific white matter T2-weighted signal hyperintensities, possibly related to early chronic small vessel disease or migraine headaches. Electronically signed by: Franky Stanford MD 08/13/2024 12:34 AM EDT RP Workstation: [id number]   CT HEAD WO CONTRAST Result Date: 08/12/2024 EXAM: CT HEAD WITHOUT CONTRAST 08/12/2024 10:08:08 PM TECHNIQUE: CT of the head was performed without the administration of intravenous contrast. Automated exposure control, iterative reconstruction, and/or weight based adjustment of the mA/kV was utilized to reduce the radiation dose to as low as reasonably achievable. COMPARISON: 10/29/2020 CLINICAL HISTORY: Neuro deficit, acute, stroke suspected; dysarthria for 1 day, eval for stroke. BIB ems for hypertension, has not been taking for the last 6 years. Per pt I think I might have had a stroke yesterday. I had a headache, my BP was up, I was having trouble getting words out, and I was dizzy. Pt unsure of the time of onset. All symptoms have resolved except hypertension. FINDINGS: BRAIN AND VENTRICLES: No acute hemorrhage. No evidence of acute infarct. No hydrocephalus. No extra-axial collection. No mass effect or midline shift. ORBITS: No acute  abnormality. SINUSES: No acute abnormality. SOFT TISSUES AND SKULL: No acute soft tissue abnormality. No skull fracture. IMPRESSION: 1. No acute intracranial abnormality. Electronically signed by: Franky Stanford MD 08/12/2024 10:29 PM EDT RP Workstation: [id number]    Microbiology: Results for orders placed or performed during the hospital encounter of 08/12/24  MRSA Next Gen by PCR, Nasal     Status: None   Collection Time: 08/13/24 11:32 PM   Specimen: Nasal Mucosa; Nasal Swab  Result Value Ref Range Status   MRSA by PCR Next Gen NOT DETECTED NOT DETECTED Final    Comment: (NOTE) The GeneXpert MRSA Assay (FDA approved for NASAL specimens only), is one component of a comprehensive MRSA colonization  surveillance program. It is not intended to diagnose MRSA infection nor to guide or monitor treatment for MRSA infections. Test performance is not FDA approved in patients less than 77 years old. Performed at [facility], [address] [place], [place] [address]     Labs: CBC: Recent Labs  Lab 08/12/24 2146 08/13/24 2324 08/14/24 9547 08/14/24 0948 08/15/24 0107 08/15/24 0606 08/15/24 1424 08/16/24 0943  WBC 7.6 12.5* 11.5*  --  10.9*  --   --  9.2  NEUTROABS 4.8 9.4* 8.7*  --   --   --   --   --   HGB 11.4* 8.9* 8.6* 9.9* 9.9* 9.3* 10.4* 9.9*  HCT 37.9* 29.8* 28.6* 30.7* 30.8* 29.7* 33.5* 31.3*  MCV 75.2* 77.0* 78.1*  --  78.4*  --   --  79.2*  PLT 316 314 247  --  243  --   --  256   Basic Metabolic Panel: Recent Labs  Lab 08/12/24 2146 08/13/24 2324 08/14/24 0452 08/15/24 0107 08/16/24 0943  NA 139 139 139 139 136  K 3.5 4.2 4.0 3.7 3.2*  CL 102 104 107 110 106  CO2 24 23 24 22 24   GLUCOSE 101* 182* 114* 92 119*  BUN 14 31* 29* 15 11  CREATININE 1.04 1.22 1.17 1.07 1.12  CALCIUM  9.2 8.4* 8.3* 8.5* 8.4*   Liver Function Tests: Recent Labs  Lab 08/12/24 2146 08/13/24 2324  AST 23 22  ALT 13 11  ALKPHOS 92 62  BILITOT 0.5 0.6  PROT 7.7 6.4*  ALBUMIN 4.1 3.4*   CBG: Recent Labs  Lab 08/13/24 2128 08/13/24 2254  GLUCAP 110* 86    Discharge time spent: greater than 30 minutes.  This record has been created using Conservation officer, historic buildings. Errors have been sought and corrected,but may not always be located. Such creation errors do not reflect on the standard of care.   Signed: Amaryllis Dare, MD [facility] 08/16/2024

## 2024-08-19 ENCOUNTER — Telehealth: Payer: Self-pay | Admitting: Medical

## 2024-08-19 NOTE — Telephone Encounter (Signed)
 Pt called back made appt for 08/28/24 with Cadence

## 2024-08-19 NOTE — Telephone Encounter (Signed)
-----   Message from Nurse Russell ORN sent at 08/19/2024 10:00 AM EDT ----- Regarding: FW: hospital follow-up Pt already has an appointment with HF. Please schedule 1-2 week follow up with our office. ----- Message ----- From: Franchester Mikey DEL, PA-C Sent: 08/16/2024   2:30 PM EDT To: [facility] Triage; [facility] Scheduling Subject: hospital follow-up                             Patient needs hospital follow-up in 1-2 weeks. Also will need AHF referral. thanks

## 2024-08-19 NOTE — Telephone Encounter (Signed)
 Unable to leave voicemail due to mailbox being full. Pt needs  hospital follow up in 1-2 weeks. Thank you

## 2024-08-22 ENCOUNTER — Telehealth: Payer: Self-pay | Admitting: Family

## 2024-08-22 NOTE — Telephone Encounter (Signed)
 Called to confirm/remind patient of their appointment at the [facility] on 08/23/24.   Appointment:   [x] Confirmed  [] Left mess   [] No answer/No voice mail  [] VM Full/unable to leave message  [] Phone not in service  Patient reminded to bring all medications and/or complete list.  Confirmed patient has transportation. Gave directions, instructed to utilize valet parking.

## 2024-08-22 NOTE — Progress Notes (Unsigned)
 [facility] Note   Referring Physician: 09/25 admission PCP: Pcp, No Cardiologist: Timothy Gollan, MD   Chief Complaint: shortness of breath   HPI:  Fred [name] is a 48 y/o male with a history of HFrEF (newly diagnosed 09/25), HTN, GIB, alcohol use, substance use, medication noncompliance, TIA.   Admitted 08/12/24 with acute onset of lightheadedness the day prior with dysarthria and mild headache. Found to have elevated blood pressure on admission and was admitted for concern of TIA. Symptoms resolved by the time of admission. Neurology started on DAPT with aspirin  and Plavix . 9/3: Found on floor surrounded by large melanotic stool, hypotensive and tachycardic. Given IVF with improvement in blood pressure. Hemoglobin decreased to 8.9 from 11.4.  CTA  was negative for any active bleeding. CT head and cervical spine was obtained to rule out any trauma and they were negative for any acute abnormalities. MRI with concern of chronic small vessel disease. GI was consulted, patient was started on IV Protonix  and octreotide  due to his history of alcohol abuse. Aspirin  and Plavix  are being held for EGD / colonoscopy. Echocardiogram with new diagnosis of HFrEF, EF of 35 to 40% and grade 1 diastolic dysfunction. Bubble study was negative. Cardiology consulted. UDS was positive for cocaine and cannabinoid. EGD 08/15/24 with a nonbleeding duodenal ulcer with pigmented material and gastritis-biopsies were taken. GI is recommending twice daily PPI for 1 month followed by daily for next 2 months.   He presents today for his initial HF visit with a chief complaint of minimal shortness of breath. Has associated fatigue, occasional palpitations. Sleeping well on 1 pillow. Admits that he only takes his medications once daily as he always forgets the 2nd dose. Has not taken any of his medications yet today.   NAS, eating fast food on occasion. Drinking 16 oz water, 16oz soda, 16 oz juice daily. Weighing  daily   Smoking 1 cigar daily, drinking alcohol twice daily 16-40 oz cans. Denies drug use (recent + UDS for cocaine/ marijuana 08/13/24)   Review of Systems: [y] = yes, [ ]  = no   General: Weight gain [ ] ; Weight loss [ ] ; Anorexia [ ] ; Fatigue [ y]; Fever [ ] ; Chills [ ] ; Weakness [ ]   Cardiac: Chest pain/pressure [ ] ; Resting SOB [ ] ; Exertional SOB Davis.Dad ]; Orthopnea [ ] ; Pedal Edema [ ] ; Palpitations Davis.Dad ]; Syncope [ ] ; Presyncope [ ] ; Paroxysmal nocturnal dyspnea[ ]   Pulmonary: Cough [ ] ; Wheezing[ ] ; Hemoptysis[ ] ; Sputum [ ] ; Snoring [ ]   GI: Vomiting[ ] ; Dysphagia[ ] ; Melena[ ] ; Hematochezia [ ] ; Heartburn[ ] ; Abdominal pain [ ] ; Constipation [ ] ; Diarrhea [ ] ; BRBPR [ ]   GU: Hematuria[ ] ; Dysuria [ ] ; Nocturia[ ]   Vascular: Pain in legs with walking [ ] ; Pain in feet with lying flat [ ] ; Non-healing sores [ ] ; Stroke [ ] ; TIA [ ] ; Slurred speech [ ] ;  Neuro: Headaches[ ] ; Vertigo[ ] ; Seizures[ ] ; Paresthesias[ ] ;Blurred vision [ ] ; Diplopia [ ] ; Vision changes [ ]   Ortho/Skin: Arthritis [ ] ; Joint pain [ ] ; Muscle pain [ ] ; Joint swelling [ ] ; Back Pain [ ] ; Rash [ ]   Psych: Depression[ ] ; Anxiety[ ]   Heme: Bleeding problems [ ] ; Clotting disorders [ ] ; Anemia [ ]   Endocrine: Diabetes [ ] ; Thyroid dysfunction[ ]    Past Medical History:  Diagnosis Date   Hypertension    Medical history non-contributory    Scoliosis     Current Outpatient Medications  Medication Sig Dispense Refill   atorvastatin  (LIPITOR) 40 MG tablet Take 1 tablet (40 mg total) by mouth daily. 90 tablet 2   carvedilol  (COREG ) 12.5 MG tablet Take 1 tablet (12.5 mg total) by mouth 2 (two) times daily with a meal. 60 tablet 2   dapagliflozin  propanediol (FARXIGA ) 10 MG TABS tablet Take 1 tablet (10 mg total) by mouth daily. 90 tablet 1   ferrous sulfate  325 (65 FE) MG EC tablet Take 1 tablet (325 mg total) by mouth 2 (two) times daily. 60 tablet 3   folic acid  (FOLVITE ) 1 MG tablet Take 1 tablet (1 mg total) by  mouth daily. 90 tablet 0   Multiple Vitamin (MULTIVITAMIN WITH MINERALS) TABS tablet Take 1 tablet by mouth daily. 90 tablet 1   naltrexone  (DEPADE) 50 MG tablet Take 1 tablet (50 mg total) by mouth daily. (Patient not taking: Reported on 12/03/2022) 30 tablet 1   pantoprazole  (PROTONIX ) 40 MG tablet Take 1 tablet (40 mg total) by mouth 2 (two) times daily before a meal. 60 tablet 2   sacubitril -valsartan  (ENTRESTO ) 49-51 MG Take 1 tablet by mouth 2 (two) times daily. 60 tablet 2   spironolactone  (ALDACTONE ) 25 MG tablet Take 0.5 tablets (12.5 mg total) by mouth daily. 30 tablet 1   thiamine  (VITAMIN B-1) 100 MG tablet Take 1 tablet (100 mg total) by mouth daily. 90 tablet 1   No current facility-administered medications for this visit.    No Known Allergies    Social History   Socioeconomic History   Marital status: Single    Spouse name: Not on file   Number of children: 1   Years of education: Not on file   Highest education level: 12th grade  Occupational History   Not on file  Tobacco Use   Smoking status: Every Day    Types: Cigarettes   Smokeless tobacco: Never   Tobacco comments:    pt refused  Substance and Sexual Activity   Alcohol use: Yes    Alcohol/week: 2.0 standard drinks of alcohol    Types: 2 Cans of beer per week    Comment: 2 Beers a day   Drug use: Yes    Frequency: 1.0 times per week    Types: Marijuana   Sexual activity: Yes  Other Topics Concern   Not on file  Social History Narrative   Not on file   Social Drivers of Health   Financial Resource Strain: Low Risk  (08/15/2024)   Overall Financial Resource Strain (CARDIA)    Difficulty of Paying Living Expenses: Not hard at all  Food Insecurity: No Food Insecurity (08/13/2024)   Hunger Vital Sign    Worried About Running Out of Food in the Last Year: Never true    Ran Out of Food in the Last Year: Never true  Transportation Needs: No Transportation Needs (08/13/2024)   PRAPARE - Therapist, art (Medical): No    Lack of Transportation (Non-Medical): No  Physical Activity: Not on file  Stress: Not on file  Social Connections: Not on file  Intimate Partner Violence: Not At Risk (08/13/2024)   Humiliation, Afraid, Rape, and Kick questionnaire    Fear of Current or Ex-Partner: No    Emotionally Abused: No    Physically Abused: No    Sexually Abused: No     No family history on file.  Vitals:   08/23/24 0937 08/23/24 1003  BP: (!) 190/109 (!) 180/98  Pulse:  84   SpO2: 99%   Weight: 140 lb (63.5 kg)    Wt Readings from Last 3 Encounters:  08/23/24 140 lb (63.5 kg)  08/13/24 157 lb 3 oz (71.3 kg)  11/11/20 154 lb 15.7 oz (70.3 kg)   Lab Results  Component Value Date   CREATININE 1.12 08/16/2024   CREATININE 1.07 08/15/2024   CREATININE 1.17 08/14/2024    PHYSICAL EXAM:  General: Well appearing.  Cor: No JVD. Regular rhythm, rate.  Lungs: clear Abdomen: soft, nontender, nondistended. Extremities: no edema Neuro:. Affect pleasant   ECG: not done   ASSESSMENT & PLAN:  1: HFrEF-(newly diagnosed 09/25) - suspect due to uncontrolled HTN but may need to pursue ischemic workup - NYHA class II - euvolemic - weighing daily, understands parameters to call about - Echo 08/13/24: EF 35-40%, mild LVH, G1DD, normal RV, negative bubble study - has upcoming [facility] cardiology appointment 09/25 - only taking meds daily and says that BID doses will not work - stop carvedilol  and begin metoprolol  succinate 100mg  daily - continue farxiga  10mg  - due to compliance, stop entresto  and begin valsartan  160mg  daily - continue spironolactone  12.5mg  daily. May increase this to 25mg  pending today's lab results - BMET today  2: HTN- - BP 190/109, recheck was 180/98. Changing meds per above. Has not taken his meds yet today - BMET 08/16/24 reviewed: sodium 136, potassium 3.2, creatinine 1.12 & GFR >60 - BMET today  3: Substance use- - UDS 08/13/24 + cocaine &  marijuana - denies current drug use - drinks beer 32-80 ounces daily. Counseled on the importance of abstaining  4: TIA- - Brain MRI 08/13/24 with concern of chronic small vessel disease.  5: GIB- - EGD 08/15/24 with a nonbleeding duodenal ulcer with pigmented material and gastritis-biopsies were taken. - Hg 08/16/24 was 9.9 - CBC today. Denies current bleeding - GI Referral placed today   Return in 3-4 weeks, sooner if needed.   Ellouise DELENA Class, FNP 08/22/24

## 2024-08-23 ENCOUNTER — Encounter: Payer: Self-pay | Admitting: Family

## 2024-08-23 ENCOUNTER — Ambulatory Visit: Attending: Family | Admitting: Family

## 2024-08-23 VITALS — BP 180/98 | HR 84 | Wt 140.0 lb

## 2024-08-23 DIAGNOSIS — Z79899 Other long term (current) drug therapy: Secondary | ICD-10-CM | POA: Insufficient documentation

## 2024-08-23 DIAGNOSIS — F17290 Nicotine dependence, other tobacco product, uncomplicated: Secondary | ICD-10-CM | POA: Diagnosis not present

## 2024-08-23 DIAGNOSIS — Z91148 Patient's other noncompliance with medication regimen for other reason: Secondary | ICD-10-CM | POA: Diagnosis not present

## 2024-08-23 DIAGNOSIS — Z8719 Personal history of other diseases of the digestive system: Secondary | ICD-10-CM | POA: Insufficient documentation

## 2024-08-23 DIAGNOSIS — F1290 Cannabis use, unspecified, uncomplicated: Secondary | ICD-10-CM | POA: Diagnosis not present

## 2024-08-23 DIAGNOSIS — K2970 Gastritis, unspecified, without bleeding: Secondary | ICD-10-CM | POA: Diagnosis not present

## 2024-08-23 DIAGNOSIS — R002 Palpitations: Secondary | ICD-10-CM | POA: Diagnosis not present

## 2024-08-23 DIAGNOSIS — K269 Duodenal ulcer, unspecified as acute or chronic, without hemorrhage or perforation: Secondary | ICD-10-CM | POA: Insufficient documentation

## 2024-08-23 DIAGNOSIS — I110 Hypertensive heart disease with heart failure: Secondary | ICD-10-CM | POA: Diagnosis not present

## 2024-08-23 DIAGNOSIS — F1010 Alcohol abuse, uncomplicated: Secondary | ICD-10-CM | POA: Insufficient documentation

## 2024-08-23 DIAGNOSIS — I5022 Chronic systolic (congestive) heart failure: Secondary | ICD-10-CM

## 2024-08-23 DIAGNOSIS — F1910 Other psychoactive substance abuse, uncomplicated: Secondary | ICD-10-CM | POA: Diagnosis not present

## 2024-08-23 DIAGNOSIS — Z8673 Personal history of transient ischemic attack (TIA), and cerebral infarction without residual deficits: Secondary | ICD-10-CM | POA: Insufficient documentation

## 2024-08-23 DIAGNOSIS — F1490 Cocaine use, unspecified, uncomplicated: Secondary | ICD-10-CM | POA: Insufficient documentation

## 2024-08-23 DIAGNOSIS — I10 Essential (primary) hypertension: Secondary | ICD-10-CM | POA: Diagnosis not present

## 2024-08-23 DIAGNOSIS — G459 Transient cerebral ischemic attack, unspecified: Secondary | ICD-10-CM

## 2024-08-23 DIAGNOSIS — F17210 Nicotine dependence, cigarettes, uncomplicated: Secondary | ICD-10-CM | POA: Insufficient documentation

## 2024-08-23 DIAGNOSIS — K922 Gastrointestinal hemorrhage, unspecified: Secondary | ICD-10-CM | POA: Diagnosis not present

## 2024-08-23 DIAGNOSIS — I5020 Unspecified systolic (congestive) heart failure: Secondary | ICD-10-CM | POA: Insufficient documentation

## 2024-08-23 DIAGNOSIS — Z7141 Alcohol abuse counseling and surveillance of alcoholic: Secondary | ICD-10-CM | POA: Insufficient documentation

## 2024-08-23 MED ORDER — VALSARTAN 160 MG PO TABS: 160.0000 mg | ORAL_TABLET | Freq: Every day | ORAL | 5 refills | Status: DC

## 2024-08-23 MED ORDER — METOPROLOL SUCCINATE ER 100 MG PO TB24: 100.0000 mg | ORAL_TABLET | Freq: Every day | ORAL | 5 refills | Status: DC

## 2024-08-23 NOTE — Patient Instructions (Signed)
 Medication Changes:  DISCONTINUE ENTRESTO  AND COREG   START METOPROLOL  100 MG ONCE DAILY  START VALSARTAN  160 MG ONCE DAILY   Lab Work:  Go over to the [facility]. Go pass the gift shop and have your blood work completed.  We will only call you if the results are abnormal or if the provider would like to make medication changes.  No news is good news.   Special Instructions // Education:  Please be sure to bring in all your medications bottles to every appointment.   Continue weighing daily and call for an overnight weight gain of 3 pounds or more or a weekly weight gain of more than 5 pounds.   Follow-Up in: 3-4 WEEKS WITH ELLOUISE CLASS, FNP.   Thank you for choosing [facility].    At the [facility], you and your health needs are our priority. We have a designated team specialized in the treatment of Heart Failure. This Care Team includes your primary Heart Failure Specialized Cardiologist (physician), Advanced Practice Providers (APPs- Physician Assistants and Nurse Practitioners), and Pharmacist who all work together to provide you with the care you need, when you need it.   You may see any of the following providers on your designated Care Team at your next follow up:  Dr. Toribio Fuel Dr. Ezra Shuck Dr. Ria Commander Dr. Morene Brownie ELLOUISE CLASS, FNP Jaun Bash, RPH-CPP  Need to Contact Us :  If you have any questions or concerns before your next appointment please send us  a message through [facility] or call our office at [phone].    TO LEAVE A MESSAGE FOR THE NURSE SELECT OPTION 2, PLEASE LEAVE A MESSAGE INCLUDING: YOUR NAME DATE OF BIRTH CALL BACK NUMBER REASON FOR CALL**this is important as we prioritize the call backs  YOU WILL RECEIVE A CALL BACK THE SAME DAY AS LONG AS YOU CALL BEFORE 4:00 PM

## 2024-08-24 ENCOUNTER — Other Ambulatory Visit: Payer: Self-pay

## 2024-08-24 ENCOUNTER — Emergency Department
Admission: EM | Admit: 2024-08-24 | Discharge: 2024-08-24 | Disposition: A | Discharge status: Home / Self Care | Attending: Emergency Medicine | Admitting: Emergency Medicine

## 2024-08-24 DIAGNOSIS — I10 Essential (primary) hypertension: Secondary | ICD-10-CM | POA: Insufficient documentation

## 2024-08-24 DIAGNOSIS — Y907 Blood alcohol level of 200-239 mg/100 ml: Secondary | ICD-10-CM | POA: Diagnosis not present

## 2024-08-24 DIAGNOSIS — Z765 Malingerer [conscious simulation]: Secondary | ICD-10-CM | POA: Insufficient documentation

## 2024-08-24 DIAGNOSIS — Z5900 Homelessness unspecified: Secondary | ICD-10-CM | POA: Insufficient documentation

## 2024-08-24 HISTORY — DX: Heart failure, unspecified: I50.9

## 2024-08-24 HISTORY — DX: Adjustment disorder, unspecified: F43.20

## 2024-08-24 HISTORY — DX: Alcohol abuse, uncomplicated: F10.10

## 2024-08-24 HISTORY — DX: Homelessness unspecified: Z59.00

## 2024-08-24 HISTORY — DX: Gastrointestinal hemorrhage, unspecified: K92.2

## 2024-08-24 LAB — CBC
HCT: 32 % — ABNORMAL LOW (ref 39.0–52.0)
Hematocrit: 37.4 % — ABNORMAL LOW (ref 37.5–51.0)
Hemoglobin: 11.1 g/dL — ABNORMAL LOW (ref 13.0–17.7)
Hemoglobin: 9.9 g/dL — ABNORMAL LOW (ref 13.0–17.0)
MCH: 24.5 pg — ABNORMAL LOW (ref 26.6–33.0)
MCH: 25.3 pg — ABNORMAL LOW (ref 26.0–34.0)
MCHC: 29.7 g/dL — ABNORMAL LOW (ref 31.5–35.7)
MCHC: 30.9 g/dL (ref 30.0–36.0)
MCV: 83 fL (ref 79–97)
MCV: 81.6 fL (ref 80.0–100.0)
Platelets: 460 x10E3/uL — ABNORMAL HIGH (ref 150–450)
Platelets: 462 K/uL — ABNORMAL HIGH (ref 150–400)
RBC: 4.53 x10E6/uL (ref 4.14–5.80)
RBC: 3.92 MIL/uL — ABNORMAL LOW (ref 4.22–5.81)
RDW: 18.6 % — ABNORMAL HIGH (ref 11.6–15.4)
RDW: 19 % — ABNORMAL HIGH (ref 11.5–15.5)
WBC: 9.7 x10E3/uL (ref 3.4–10.8)
WBC: 8.5 K/uL (ref 4.0–10.5)
nRBC: 0 % (ref 0.0–0.2)

## 2024-08-24 LAB — COMPREHENSIVE METABOLIC PANEL WITH GFR
ALT: 12 U/L (ref 0–44)
AST: 21 U/L (ref 15–41)
Albumin: 4.3 g/dL (ref 3.5–5.0)
Alkaline Phosphatase: 78 U/L (ref 38–126)
Anion gap: 15 (ref 5–15)
BUN: 12 mg/dL (ref 6–20)
CO2: 23 mmol/L (ref 22–32)
Calcium: 8.7 mg/dL — ABNORMAL LOW (ref 8.9–10.3)
Chloride: 108 mmol/L (ref 98–111)
Creatinine, Ser: 1.34 mg/dL — ABNORMAL HIGH (ref 0.61–1.24)
GFR, Estimated: >60 mL/min (ref >60)
Glucose, Bld: 91 mg/dL (ref 70–99)
Potassium: 3.8 mmol/L (ref 3.5–5.1)
Sodium: 146 mmol/L — ABNORMAL HIGH (ref 135–145)
Total Bilirubin: 0.3 mg/dL (ref 0.0–1.2)
Total Protein: 7.7 g/dL (ref 6.5–8.1)

## 2024-08-24 LAB — BASIC METABOLIC PANEL WITH GFR
BUN/Creatinine Ratio: 7 — ABNORMAL LOW (ref 9–20)
BUN: 12 mg/dL (ref 6–24)
CO2: 22 mmol/L (ref 20–29)
Calcium: 9.6 mg/dL (ref 8.7–10.2)
Chloride: 104 mmol/L (ref 96–106)
Creatinine, Ser: 1.61 mg/dL — ABNORMAL HIGH (ref 0.76–1.27)
Glucose: 86 mg/dL (ref 70–99)
Potassium: 4.6 mmol/L (ref 3.5–5.2)
Sodium: 141 mmol/L (ref 134–144)
eGFR: 52 mL/min/1.73 — ABNORMAL LOW (ref >59)

## 2024-08-24 LAB — ETHANOL: Alcohol, Ethyl (B): 236 mg/dL — ABNORMAL HIGH (ref <15)

## 2024-08-24 NOTE — ED Provider Notes (Signed)
 [facility] Provider Note    Event Date/Time   First MD Initiated Contact with Patient 08/24/24 1718     (approximate)   History   Homeless and Psychiatric Evaluation   HPI  Fred [name] is a 48 y.o. male who presents to the ED for evaluation of Homeless and Psychiatric Evaluation   Review a medical DC summary from 8 days ago.  History of HTN, noncompliance, alcohol abuse admitted for possible TIA.  GI bleeding with melanotic stool while on the floor, EGD with nonbleeding duodenal ulcer started on PPI.   Patient presents to the ED because he just needed something to do.  He is asking for food and asking to stay to be admitted so he can sleep in a bed.  Denies any pain anywhere or any acute concerns.  He thinks he has been taking some medication since he was discharged.   Physical Exam   Triage Vital Signs: ED Triage Vitals [08/24/24 1610]  Encounter Vitals Group     BP (!) 172/105     Girls Systolic BP Percentile      Girls Diastolic BP Percentile      Boys Systolic BP Percentile      Boys Diastolic BP Percentile      Pulse Rate 80     Resp 18     Temp 98.1 F (36.7 C)     Temp src      SpO2 100 %     Weight 140 lb (63.5 kg)     Height      Head Circumference      Peak Flow      Pain Score 0     Pain Loc      Pain Education      Exclude from Growth Chart     Most recent vital signs: Vitals:   08/24/24 1610 08/24/24 1619  BP: (!) 172/105 (!) 172/105  Pulse: 80 80  Resp: 18   Temp: 98.1 F (36.7 C)   SpO2: 100%     General: Awake, no distress.  Fast asleep initially and awakens to vocal stimulation and stays awake and is generally conversational. CV:  Good peripheral perfusion.  Resp:  Normal effort.  Abd:  No distention.  MSK:  No deformity noted.  Neuro:  No focal deficits appreciated. Other:     ED Results / Procedures / Treatments   Labs (all labs ordered are listed, but only abnormal results are  displayed) Labs Reviewed  COMPREHENSIVE METABOLIC PANEL WITH GFR - Abnormal; Notable for the following components:      Result Value   Sodium 146 (*)    Creatinine, Ser 1.34 (*)    Calcium  8.7 (*)    All other components within normal limits  ETHANOL - Abnormal; Notable for the following components:   Alcohol, Ethyl (B) 236 (*)    All other components within normal limits  CBC - Abnormal; Notable for the following components:   RBC 3.92 (*)    Hemoglobin 9.9 (*)    HCT 32.0 (*)    MCH 25.3 (*)    RDW 19.0 (*)    Platelets 462 (*)    All other components within normal limits  URINE DRUG SCREEN, QUALITATIVE ([facility] ONLY)    EKG   RADIOLOGY   Official radiology report(s): No results found.  PROCEDURES and INTERVENTIONS:  Procedures  Medications - No data to display   IMPRESSION / MDM / ASSESSMENT AND PLAN /  ED COURSE  I reviewed the triage vital signs and the nursing notes.  Differential diagnosis includes, but is not limited to, GI bleeding, polysubstance abuse, acute intoxication, acute withdrawals  Patient presents to the ED without evidence of acute pathology and suitable for outpatient management.  Acutely intoxicated, sleeps for few hours, given some food and he is suitable for outpatient management.  Renal dysfunction at baseline, hemoglobin uptrending from recent admission.  No acute concerns      FINAL CLINICAL IMPRESSION(S) / ED DIAGNOSES   Final diagnoses:  Malingering     Rx / DC Orders   ED Discharge Orders     None        Note:  This document was prepared using Dragon voice recognition software and may include unintentional dictation errors.   Claudene Rover, MD 08/24/24 [phone]

## 2024-08-24 NOTE — ED Triage Notes (Signed)
 Pt states I just need to be admitted. PT cousin with pt and states he is inebriated and needs a full check up because he doesn't go to the doctor. Pt states he is homeless and has not taken bp medication today. Pt drinks beer daily. Pt is mumbling words and unable to states how much he has had to drink. PT denies any SI/HI. PT admits to marijuana use and no other drugs.

## 2024-08-24 NOTE — ED Triage Notes (Signed)
 Pt cousin Angeline Long hesitant to provide contact information because she states she doesn't know his medical history and to contact relatives listed in chart. However if pt needs to speak to her or needs a ride she may be able to help but lives in [place] and depends on her schedule.   Angeline Law740 [phone]

## 2024-08-24 NOTE — ED Triage Notes (Addendum)
 Pt belongings- tennis shoes, green shirt, black pants, boxers , cell phone $25 cash

## 2024-08-25 ENCOUNTER — Ambulatory Visit: Payer: Self-pay | Admitting: Family

## 2024-08-28 ENCOUNTER — Ambulatory Visit: Admitting: Medical

## 2024-08-30 ENCOUNTER — Encounter: Payer: Self-pay | Admitting: Medical

## 2024-08-30 ENCOUNTER — Ambulatory Visit: Attending: Medical | Admitting: Medical

## 2024-08-30 VITALS — BP 120/80 | HR 69 | Ht 60.0 in | Wt 137.0 lb

## 2024-08-30 DIAGNOSIS — Z5900 Homelessness unspecified: Secondary | ICD-10-CM | POA: Diagnosis present

## 2024-08-30 DIAGNOSIS — K922 Gastrointestinal hemorrhage, unspecified: Secondary | ICD-10-CM | POA: Diagnosis present

## 2024-08-30 DIAGNOSIS — F1090 Alcohol use, unspecified, uncomplicated: Secondary | ICD-10-CM | POA: Insufficient documentation

## 2024-08-30 DIAGNOSIS — F1910 Other psychoactive substance abuse, uncomplicated: Secondary | ICD-10-CM | POA: Diagnosis present

## 2024-08-30 DIAGNOSIS — I5022 Chronic systolic (congestive) heart failure: Secondary | ICD-10-CM | POA: Diagnosis present

## 2024-08-30 DIAGNOSIS — I10 Essential (primary) hypertension: Secondary | ICD-10-CM | POA: Diagnosis present

## 2024-08-30 MED ORDER — METOPROLOL TARTRATE 100 MG PO TABS: ORAL_TABLET | ORAL | 0 refills | Status: DC

## 2024-08-30 NOTE — Patient Instructions (Signed)
 Medication Instructions:  - Take one 100 mg metoprolol  two hours prior to cardiac CT *If you need a refill on your cardiac medications before your next appointment, please call your pharmacy*  Lab Work: Your provider would like for you to have following labs drawn today BMP.   If you have labs (blood work) drawn today and your tests are completely normal, you will receive your results only by: MyChart Message (if you have MyChart) OR A paper copy in the mail If you have any lab test that is abnormal or we need to change your treatment, we will call you to review the results.  Testing/Procedures:   Your cardiac CT will be scheduled at:  [facility] [address] [place], [place] [address] [phone]  Please arrive 15 mins early for check-in and test prep.  There is spacious parking and easy access to the radiology department from the [facility] and Vascular entrance. Please enter here and check-in with the desk attendant.    Please follow these instructions carefully (unless otherwise directed):  An IV will be required for this test and Nitroglycerin will be given.  Hold all erectile dysfunction medications at least 3 days (72 hrs) prior to test. (Ie viagra, cialis, sildenafil, tadalafil, etc)     On the Night Before the Test: Be sure to Drink plenty of water. Do not consume any caffeinated/decaffeinated beverages or chocolate 12 hours prior to your test. Do not take any antihistamines 12 hours prior to your test.  On the Day of the Test: Drink plenty of water until 1 hour prior to the test. Do not eat any food 1 hour prior to test. You may take your regular medications prior to the test.  Take metoprolol  (Lopressor ) two hours prior to test. If you take Furosemide/Hydrochlorothiazide/Spironolactone /Chlorthalidone, please HOLD on the morning of the test. Patients who wear a continuous glucose monitor MUST remove the device prior to  scanning. FEMALES- please wear underwire-free bra if available, avoid dresses & tight clothing       After the Test: Drink plenty of water. After receiving IV contrast, you may experience a mild flushed feeling. This is normal. On occasion, you may experience a mild rash up to 24 hours after the test. This is not dangerous. If this occurs, you can take Benadryl 25 mg, Zyrtec, Claritin, or Allegra and increase your fluid intake. (Patients taking Tikosyn should avoid Benadryl, and may take Zyrtec, Claritin, or Allegra) If you experience trouble breathing, this can be serious. If it is severe call 911 IMMEDIATELY. If it is mild, please call our office.  We will call to schedule your test 2-4 weeks out understanding that some insurance companies will need an authorization prior to the service being performed.   For more information and frequently asked questions, please visit our website : [email or web address]  For non-scheduling related questions, please contact the cardiac imaging nurse navigator should you have any questions/concerns: Cardiac Imaging Nurse Navigators Direct Office Dial: [phone]   For scheduling needs, including cancellations and rescheduling, please call [place], [phone].    Follow-Up: At [facility], you and your health needs are our priority.  As part of our continuing mission to provide you with exceptional heart care, our providers are all part of one team.  This team includes your primary Cardiologist (physician) and Advanced Practice Providers or APPs (Physician Assistants and Nurse Practitioners) who all work together to provide you with the care you need, when you need it.  Your next appointment:   3 month(s)  Provider:   You may see Timothy Gollan, MD or one of the following Advanced Practice Providers on your designated Care Team:   Lonni Meager, NP Lesley Maffucci, PA-C Bernardino Bring, PA-C Cadence [place], PA-C Tylene Lunch,  NP Barnie Hila, NP    We recommend signing up for the patient portal called MyChart.  Sign up information is provided on this After Visit Summary.  MyChart is used to connect with patients for Virtual Visits (Telemedicine).  Patients are able to view lab/test results, encounter notes, upcoming appointments, etc.  Non-urgent messages can be sent to your provider as well.   To learn more about what you can do with MyChart, go to [email or web address].   Other Instructions

## 2024-08-30 NOTE — Progress Notes (Signed)
 Cardiology Office Note   Date:  08/30/2024  ID:  Pearley Millicent Pinal, DOB 11-15-1976, MRN [id number] PCP: [name]  [facility] Providers Cardiologist:  Evalene Lunger, MD   History of Present Illness Fred [name] is a 48 y.o. male with a history of hypertension, alcohol use, cocaine use, marijuana use, homelessness, noncompliance, TIA, and systolic heart failure who is being seen for hospital follow-up.  Patient presented to the ER 08/12/2024 for hypertension.  He was lightheaded and dizzy with a headache.  He reported transient dysarthria.  In the ER he had a mild fever.  Alcohol level of 37.  High-sensitivity troponin 28.  EKG showed normal sinus rhythm.  MRI of the brain was nonacute.  CTA of the head and neck with no acute abnormality and he was admitted for further workup for stroke.On 08/13/24 he was found on the floor with melonotic dark stool and emesis all over the patient, room and bathroom. Patient was alert and oriented. BP dropped to 94/63, HR 101. IV NS bolus was started. Stat head CT showed no acute abnormalities. CT spine showed no acute findings. GIB scan showed no active extravasation identified. The patient was given IV NS and 2 units PRBCs, IV octreotide . GI was asked to see. Echo bubble study showed LVEF 35-40%, mild LVH, G1DD. Cardiology was asked to see.  UDS was positive for cannabinoid and cocaine.  He reported daily alcohol use.  No cath was pursued given anemia, drug use, and noncompliance.   EGD showed known bleeding duodenal ulcer with pigmented material and gastritis. He was started on Coreg , Entresto , spironolactone , and SGLT2 inhibitor.   Today, the patient reports he is feeling OK. No chest pain, SOB, lower leg edema. He eats low salt diet. He reports he has not drank alcohol since the most recent ER visit. He denies recent drug use. He is homeless and staying with his aunt right now. He reports he is on disability.  He is not taking Farxiga , maybe at  [facility].   Studies Reviewed      Echo 08/2024  1. Left ventricular ejection fraction, by estimation, is 35 to 40%. Left  ventricular ejection fraction by 3D volume is 36 %. The left ventricle has  moderately decreased function. The left ventricle demonstrates global  hypokinesis. There is mild left  ventricular hypertrophy. Left ventricular diastolic parameters are  consistent with Grade I diastolic dysfunction (impaired relaxation).  Elevated left atrial pressure. The average left ventricular global  longitudinal strain is -4.4 %.   2. Right ventricular systolic function is normal. The right ventricular  size is normal. Tricuspid regurgitation signal is inadequate for assessing  PA pressure.   3. The mitral valve is normal in structure. No evidence of mitral valve  regurgitation. No evidence of mitral stenosis.   4. The aortic valve has an indeterminant number of cusps. Aortic valve  regurgitation is not visualized. No aortic stenosis is present.   5. Agitated saline contrast bubble study was negative, with no evidence  of any interatrial shunt.       Physical Exam VS:  BP 120/80 (BP Location: Left Arm, Patient Position: Sitting, Cuff Size: Normal)   Pulse 69   Ht 5' (1.524 m)   Wt 137 lb (62.1 kg)   SpO2 99%   BMI 26.76 kg/m        Wt Readings from Last 3 Encounters:  08/30/24 137 lb (62.1 kg)  08/24/24 140 lb (63.5 kg)  08/23/24 140 lb (63.5 kg)  GEN: Well nourished, well developed in no acute distress NECK: No JVD; No carotid bruits CARDIAC: RRR, no murmurs, rubs, gallops RESPIRATORY:  Clear to auscultation without rales, wheezing or rhonchi  ABDOMEN: Soft, non-tender, non-distended EXTREMITIES:  No edema; No deformity   ASSESSMENT AND PLAN  HFrEF Recent hospitalization for TIA, hypertensive urgency, and GI bleed found to have low EF.  Echo bubble study showed EF of 35 to 40%, grade 1 diastolic dysfunction.  Cardiomyopathy may be from hypertension versus  alcohol, cannot exclude CAD.  Patient was started on GDMT with plan for outpatient ischemic workup.  Patient is taking Entresto , Coreg , spironolactone .  He is not taking Farxiga , he says it may be at [facility].  Patient has issues with homelessness and possible psychiatric issues complicating treatment.  Patient is euvolemic on exam today.  I will check on Farxiga .  Continue Entresto  49-51 mg twice daily, Coreg  12.5 mg twice daily, spironolactone  12.5 mg daily.  Plan to repeat echocardiogram in 2 to 3 months.  I will order cardiac CTA, hopefully this can be done.   Hypertension Blood pressure today is good at 120/80, continue current medications  GI bleed EGD showed known bleeding duodenal ulcer with pigmented material and gastritis. Most recent CBC showed hemoglobin of 9.9.  Alcohol abuse In the ER on 08/24/2024 alcohol level was severely elevated.  He says he has not had a drink since then.  Cessation encouraged.  Substance abuse UDS in the hospital was positive for cocaine and cannabinoid.  He denies any recent drug use.  Homelessness Patient is currently staying at his aunts house.  Patient has no consistent place to stay or access to food.  Will consult social work.     Dispo: Follow-up in 2-3 months  Signed, Abdulwahab Demelo VEAR Fishman, PA-C

## 2024-08-30 NOTE — Addendum Note (Signed)
 Addended by: Gilbert Manolis A on: 08/30/2024 03:07 PM   Modules accepted: Orders

## 2024-08-31 LAB — BASIC METABOLIC PANEL WITH GFR
BUN/Creatinine Ratio: 9 (ref 9–20)
BUN: 9 mg/dL (ref 6–24)
CO2: 23 mmol/L (ref 20–29)
Calcium: 9.6 mg/dL (ref 8.7–10.2)
Chloride: 102 mmol/L (ref 96–106)
Creatinine, Ser: 1.01 mg/dL (ref 0.76–1.27)
Glucose: 85 mg/dL (ref 70–99)
Potassium: 5 mmol/L (ref 3.5–5.2)
Sodium: 141 mmol/L (ref 134–144)
eGFR: 92 mL/min/1.73 (ref >59)

## 2024-09-02 ENCOUNTER — Ambulatory Visit: Payer: Self-pay | Admitting: Medical

## 2024-09-12 ENCOUNTER — Telehealth: Payer: Self-pay | Admitting: Family

## 2024-09-12 NOTE — Progress Notes (Deleted)
 Advanced Heart Failure Clinic Note   Referring Physician: 09/25 admission PCP: Pcp, No Cardiologist: Timothy Gollan, MD / Franchester Mail, PA  Chief Complaint:    HPI:  Mr Fred [name] is a 48 y/o male with a history of HFrEF (newly diagnosed 09/25), HTN, GIB, alcohol use, substance use, medication noncompliance, TIA.   Admitted 08/12/24 with acute onset of lightheadedness the day prior with dysarthria and mild headache. Found to have elevated blood pressure on admission and was admitted for concern of TIA. Symptoms resolved by the time of admission. Neurology started on DAPT with aspirin  and Plavix . 9/3: Found on floor surrounded by large melanotic stool, hypotensive and tachycardic. Given IVF with improvement in blood pressure. Hemoglobin decreased to 8.9 from 11.4.  CTA  was negative for any active bleeding. CT head and cervical spine was obtained to rule out any trauma and they were negative for any acute abnormalities. MRI with concern of chronic small vessel disease. GI was consulted, patient was started on IV Protonix  and octreotide  due to his history of alcohol abuse. Aspirin  and Plavix  are being held for EGD / colonoscopy. Echocardiogram with new diagnosis of HFrEF, EF of 35 to 40% and grade 1 diastolic dysfunction. Bubble study was negative. Cardiology consulted. UDS was positive for cocaine and cannabinoid. EGD 08/15/24 with a nonbleeding duodenal ulcer with pigmented material and gastritis-biopsies were taken. GI is recommending twice daily PPI for 1 month followed by daily for next 2 months.   Was in the ER 08/24/24 saying he would like some food and to be admitted so that he had a bed to sleep in. Found to be acutely intoxicated. Slept for a few hours and given food. Labs stable and he was released.   He presents today for HF follow-up visit with a chief complaint of   NAS, eating fast food on occasion. Drinking 16 oz water, 16oz soda, 16 oz juice daily. Weighing daily   Smoking 1 cigar  daily, drinking alcohol twice daily 16-40 oz cans. Denies drug use (recent + UDS for cocaine/ marijuana 08/13/24)   ROS: All systems negative except what is listed in HPI, PMH and Problem List   Past Medical History:  Diagnosis Date   Adjustment disorder    Alcohol abuse    CHF (congestive heart failure) (HCC)    systolic HF   GI bleed    Homeless    Hypertension    Medical history non-contributory    Scoliosis    TIA (transient ischemic attack) 08/12/2024    Current Outpatient Medications  Medication Sig Dispense Refill   atorvastatin  (LIPITOR) 40 MG tablet Take 1 tablet (40 mg total) by mouth daily. 90 tablet 2   dapagliflozin  propanediol (FARXIGA ) 10 MG TABS tablet Take 1 tablet (10 mg total) by mouth daily. (Patient not taking: Reported on 08/30/2024) 90 tablet 1   ferrous sulfate  325 (65 FE) MG EC tablet Take 1 tablet (325 mg total) by mouth 2 (two) times daily. 60 tablet 3   folic acid  (FOLVITE ) 1 MG tablet Take 1 tablet (1 mg total) by mouth daily. 90 tablet 0   metoprolol  succinate (TOPROL -XL) 100 MG 24 hr tablet Take 1 tablet (100 mg total) by mouth daily. Take with or immediately following a meal. (Patient not taking: Reported on 08/30/2024) 30 tablet 5   metoprolol  tartrate (LOPRESSOR ) 100 MG tablet TAKE 1 TABLET 2 HR PRIOR TO CARDIAC PROCEDURE 1 tablet 0   Multiple Vitamin (MULTIVITAMIN WITH MINERALS) TABS tablet Take 1 tablet by mouth  daily. 90 tablet 1   pantoprazole  (PROTONIX ) 40 MG tablet Take 1 tablet (40 mg total) by mouth 2 (two) times daily before a meal. 60 tablet 2   spironolactone  (ALDACTONE ) 25 MG tablet Take 0.5 tablets (12.5 mg total) by mouth daily. 30 tablet 1   thiamine  (VITAMIN B-1) 100 MG tablet Take 1 tablet (100 mg total) by mouth daily. 90 tablet 1   No current facility-administered medications for this visit.    No Known Allergies    Social History   Socioeconomic History   Marital status: Single    Spouse name: Not on file   Number of  children: 1   Years of education: Not on file   Highest education level: 12th grade  Occupational History   Not on file  Tobacco Use   Smoking status: Every Day    Types: Cigarettes   Smokeless tobacco: Never   Tobacco comments:    pt refused  Substance and Sexual Activity   Alcohol use: Yes    Alcohol/week: 2.0 standard drinks of alcohol    Types: 2 Cans of beer per week    Comment: 2 Beers a day   Drug use: Yes    Frequency: 1.0 times per week    Types: Marijuana   Sexual activity: Yes  Other Topics Concern   Not on file  Social History Narrative   Not on file   Social Drivers of Health   Financial Resource Strain: Low Risk  (08/15/2024)   Overall Financial Resource Strain (CARDIA)    Difficulty of Paying Living Expenses: Not hard at all  Food Insecurity: No Food Insecurity (08/13/2024)   Hunger Vital Sign    Worried About Running Out of Food in the Last Year: Never true    Ran Out of Food in the Last Year: Never true  Transportation Needs: No Transportation Needs (08/13/2024)   PRAPARE - Administrator, Civil Service (Medical): No    Lack of Transportation (Non-Medical): No  Physical Activity: Not on file  Stress: Not on file  Social Connections: Not on file  Intimate Partner Violence: Not At Risk (08/13/2024)   Humiliation, Afraid, Rape, and Kick questionnaire    Fear of Current or Ex-Partner: No    Emotionally Abused: No    Physically Abused: No    Sexually Abused: No     No family history on file.     PHYSICAL EXAM:  General: Well appearing.  Cor: No JVD. Regular rhythm, rate.  Lungs: clear Abdomen: soft, nontender, nondistended. Extremities: no edema Neuro:. Affect pleasant   ECG: not done   ASSESSMENT & PLAN:  1: HFrEF-(newly diagnosed 09/25) - suspect due to uncontrolled HTN but may need to pursue ischemic workup - NYHA class II - euvolemic - weight 140 pounds from last visit 3 weeks ago - Echo 08/13/24: EF 35-40%, mild LVH, G1DD,  normal RV, negative bubble study - saw cardiology Dene) 09/25 - only taking meds daily and says that BID doses will not work - continue metoprolol  succinate 100mg  daily - continue farxiga  10mg  - continue valsartan  160mg  daily - continue spironolactone  12.5mg  daily - BMET today  2: HTN- - BP  - BMET 08/30/24 reviewed: sodium 141, potassium 5.0, creatinine 1.01 & GFR 92  3: Substance use- - UDS 08/13/24 + cocaine & marijuana - denies current drug use - drinks beer 32-80 ounces daily. Counseled on the importance of abstaining  4: TIA- - Brain MRI 08/13/24 with concern of chronic small  vessel disease.  5: GIB- - EGD 08/15/24 with a nonbleeding duodenal ulcer with pigmented material and gastritis-biopsies were taken. - Hg 08/24/24 was 9.9 - GI Referral placed at last visit    Ellouise DELENA Class, FNP 09/12/24

## 2024-09-12 NOTE — Telephone Encounter (Signed)
 Called to confirm/remind patient of their appointment at the Advanced Heart Failure Clinic on 09/13/24.   Appointment:   [] Confirmed  [x] Left mess   [] No answer/No voice mail  [] VM Full/unable to leave message  [] Phone not in service  Patient reminded to bring all medications and/or complete list.  Confirmed patient has transportation. Gave directions, instructed to utilize valet parking.

## 2024-09-13 ENCOUNTER — Telehealth: Payer: Self-pay | Admitting: Family

## 2024-09-13 ENCOUNTER — Encounter: Admitting: Family

## 2024-09-13 NOTE — Telephone Encounter (Signed)
 Patient did not show for his Heart Failure Clinic appointment on 09/13/24.

## 2024-09-18 ENCOUNTER — Telehealth (HOSPITAL_COMMUNITY): Payer: Self-pay | Admitting: Emergency Medicine

## 2024-09-18 NOTE — Telephone Encounter (Signed)
 Attempted to call patient regarding upcoming cardiac CT appointment. Left message on voicemail with name and callback number Rockwell Alexandria RN Navigator Cardiac Imaging [facility] [phone] Office [phone] Cell

## 2024-09-19 ENCOUNTER — Encounter: Payer: Self-pay | Admitting: Radiology

## 2024-09-19 ENCOUNTER — Ambulatory Visit
Admission: RE | Admit: 2024-09-19 | Discharge: 2024-09-19 | Disposition: A | Discharge status: Home / Self Care | Source: Ambulatory Visit | Attending: Medical | Admitting: Medical

## 2024-09-19 DIAGNOSIS — I5022 Chronic systolic (congestive) heart failure: Secondary | ICD-10-CM | POA: Insufficient documentation

## 2024-09-19 MED ORDER — IOHEXOL 350 MG/ML SOLN
100.0000 mL | Freq: Once | INTRAVENOUS | Status: AC | PRN
  Administered 2024-09-19: 100 mL via INTRAVENOUS

## 2024-09-19 MED ORDER — NITROGLYCERIN 0.4 MG SL SUBL
0.8000 mg | SUBLINGUAL_TABLET | Freq: Once | SUBLINGUAL | Status: AC
  Administered 2024-09-19: 0.8 mg via SUBLINGUAL
  Filled 2024-09-19: qty 25

## 2024-09-19 NOTE — Progress Notes (Signed)
 Patient tolerated CT well. Vital signs stable encourage to drink water throughout day.Reasons explained and verbalized understanding. Ambulated steady gait.

## 2024-11-19 ENCOUNTER — Other Ambulatory Visit: Payer: Self-pay

## 2024-12-03 ENCOUNTER — Other Ambulatory Visit: Payer: Self-pay | Admitting: Medical

## 2024-12-03 ENCOUNTER — Ambulatory Visit: Attending: Medical | Admitting: Medical

## 2024-12-03 ENCOUNTER — Encounter: Payer: Self-pay | Admitting: Medical

## 2024-12-03 VITALS — BP 203/113 | HR 73 | Ht 60.0 in | Wt 149.8 lb

## 2024-12-03 DIAGNOSIS — F1090 Alcohol use, unspecified, uncomplicated: Secondary | ICD-10-CM | POA: Diagnosis not present

## 2024-12-03 DIAGNOSIS — Z5900 Homelessness unspecified: Secondary | ICD-10-CM | POA: Diagnosis present

## 2024-12-03 DIAGNOSIS — I10 Essential (primary) hypertension: Secondary | ICD-10-CM | POA: Insufficient documentation

## 2024-12-03 DIAGNOSIS — K922 Gastrointestinal hemorrhage, unspecified: Secondary | ICD-10-CM

## 2024-12-03 DIAGNOSIS — I5022 Chronic systolic (congestive) heart failure: Secondary | ICD-10-CM | POA: Insufficient documentation

## 2024-12-03 DIAGNOSIS — F1910 Other psychoactive substance abuse, uncomplicated: Secondary | ICD-10-CM

## 2024-12-03 MED ORDER — ATORVASTATIN CALCIUM 40 MG PO TABS: 40.0000 mg | ORAL_TABLET | Freq: Every day | ORAL | 3 refills | Status: AC

## 2024-12-03 MED ORDER — CARVEDILOL 25 MG PO TABS: 25.0000 mg | ORAL_TABLET | Freq: Two times a day (BID) | ORAL | 3 refills | Status: DC

## 2024-12-03 MED ORDER — SPIRONOLACTONE 25 MG PO TABS: 12.5000 mg | ORAL_TABLET | Freq: Every day | ORAL | 3 refills | Status: AC

## 2024-12-03 MED ORDER — DAPAGLIFLOZIN PROPANEDIOL 10 MG PO TABS: 10.0000 mg | ORAL_TABLET | Freq: Every day | ORAL | 3 refills | Status: AC

## 2024-12-03 MED ORDER — SACUBITRIL-VALSARTAN 49-51 MG PO TABS: 1.0000 | ORAL_TABLET | Freq: Two times a day (BID) | ORAL | 3 refills | Status: AC

## 2024-12-03 NOTE — Patient Instructions (Signed)
 Medication Instructions:   Please restart the following medications: Atorvastatin  40 mg by mouth daily  Farxiga  10 mg by mouth daily  Carvedilol  25 mg by mouth twice daily  Spironolactone  12.5 mg by mouth daily   Entresto  49-51 mg by mouth twice daily   *If you need a refill on your cardiac medications before your next appointment, please call your pharmacy*  Lab Work: No labs ordered today    Testing/Procedures: No test ordered today   Follow-Up: At [facility], you and your health needs are our priority.  As part of our continuing mission to provide you with exceptional heart care, our providers are all part of one team.  This team includes your primary Cardiologist (physician) and Advanced Practice Providers or APPs (Physician Assistants and Nurse Practitioners) who all work together to provide you with the care you need, when you need it.  Your next appointment:   2 month(s)  Provider:   Timothy Gollan, MD or Cadence Franchester, PA-C

## 2024-12-03 NOTE — Progress Notes (Signed)
 " Cardiology Office Note   Date:  12/03/2024  ID:  Fred [name], DOB 14-Nov-1976, MRN [id number] PCP: Freddrick, No  [facility] Providers Cardiologist:  Evalene Lunger, MD     History of Present Illness Fred [name] is a 48 y.o. male with a history of hypertension, alcohol use, cocaine use, marijuana use, homelessness, noncompliance, TIA, and systolic heart failure who is being seen for follow-up.   Patient presented to the ER 08/12/2024 for hypertension.  He was lightheaded and dizzy with a headache.  He reported transient dysarthria.  In the ER he had a mild fever.  Alcohol level of 37.  High-sensitivity troponin 28.  EKG showed normal sinus rhythm.  MRI of the brain was nonacute.  CTA of the head and neck with no acute abnormality and he was admitted for further workup for stroke.On 08/13/24 he was found on the floor with melonotic dark stool and emesis all over the patient, room and bathroom. Patient was alert and oriented. BP dropped to 94/63, HR 101. IV NS bolus was started. Stat head CT showed no acute abnormalities. CT spine showed no acute findings. GIB scan showed no active extravasation identified. The patient was given IV NS and 2 units PRBCs, IV octreotide . GI was asked to see. Echo bubble study showed LVEF 35-40%, mild LVH, G1DD. Cardiology was asked to see.  UDS was positive for cannabinoid and cocaine.  He reported daily alcohol use.  No cath was pursued given anemia, drug use, and noncompliance.   EGD showed known bleeding duodenal ulcer with pigmented material and gastritis. He was started on Coreg , Entresto , spironolactone , and SGLT2 inhibitor.   The patient was last seen 08/2024 and was overall feeling OK.  Cardiac CTA showed a coronary calcium  score of 0.  He was in the ER 11/07/24 with AMS found to have ethanol level of 282. UDS showed cocaine and THC.   Today, blood pressure is very high. He denies chest pain or SOB. He has a slight headache at times. He  denies lower leg edema. He is unsure of what medications he is taking at home. He may only be taking spironolactone . It seems he is not taking Coreg , Entresto  of farxiga . He denies denies recent alcohol ir drug use. He says he is living in [place] for right now. We can send the medications to the [facility] in [place].   Studies Reviewed EKG Interpretation Date/Time:  Tuesday December 03 2024 13:36:29 EST Ventricular Rate:  73 PR Interval:  158 QRS Duration:  108 QT Interval:  422 QTC Calculation: 464 R Axis:   -3  Text Interpretation: Normal sinus rhythm Minimal voltage criteria for LVH, may be normal variant ( Cornell product ) ST & T wave abnormality, consider inferolateral ischemia Prolonged QT When compared with ECG of 12-Aug-2024 21:42, PREVIOUS ECG IS PRESENT Confirmed by Franchester, Estrella Alcaraz ([id number]) on 12/03/2024 1:38:51 PM    Cardiac CTA 09/2024 IMPRESSION: 1. Coronary calcium  score of 0.   2. Normal coronary origin with right dominance.   3. Normal coronary arteries.   4. Left common pulmonary venous trunk anatomic variant.   RECOMMENDATIONS: CAD-RADS 0: No evidence of CAD (0%). Consider non-atherosclerotic causes of chest pain.   Caron Poser, MD  Echo 08/2024  1. Left ventricular ejection fraction, by estimation, is 35 to 40%. Left  ventricular ejection fraction by 3D volume is 36 %. The left ventricle has  moderately decreased function. The left ventricle demonstrates global  hypokinesis. There is mild left  ventricular hypertrophy. Left ventricular diastolic parameters are  consistent with Grade I diastolic dysfunction (impaired relaxation).  Elevated left atrial pressure. The average left ventricular global  longitudinal strain is -4.4 %.   2. Right ventricular systolic function is normal. The right ventricular  size is normal. Tricuspid regurgitation signal is inadequate for assessing  PA pressure.   3. The mitral valve is normal in structure. No evidence of mitral  valve  regurgitation. No evidence of mitral stenosis.   4. The aortic valve has an indeterminant number of cusps. Aortic valve  regurgitation is not visualized. No aortic stenosis is present.   5. Agitated saline contrast bubble study was negative, with no evidence  of any interatrial shunt.       Physical Exam VS:  BP (!) 203/113 (BP Location: Left Arm, Patient Position: Sitting, Cuff Size: Normal)   Pulse 73 Comment: 65 oximeter  Ht 5' (1.524 m)   Wt 149 lb 12.8 oz (67.9 kg)   SpO2 98%   BMI 29.26 kg/m        Wt Readings from Last 3 Encounters:  12/03/24 149 lb 12.8 oz (67.9 kg)  08/30/24 137 lb (62.1 kg)  08/24/24 140 lb (63.5 kg)    GEN: Well nourished, well developed in no acute distress NECK: No JVD; No carotid bruits CARDIAC: RRR, no murmurs, rubs, gallops RESPIRATORY:  Clear to auscultation without rales, wheezing or rhonchi  ABDOMEN: Soft, non-tender, non-distended EXTREMITIES:  No edema; No deformity   ASSESSMENT AND PLAN  HTN BP is severely elevated today, but patient is overall asymptomatic. He reports slight headache. Appears he has not been taking any of his medications. We will send in Entresto  49-51mg BID, Coreg  25mg  BID, spironolactone  12.5mg  daily and Farxiga  10mg  daily. These will be sent to the pharmacy in [place]. He was instructed to go to the pharmacy to pick up the medications and start taking them. He was also instructed to bring the medications at the next visit.   HFrEF Patient has not been taking medications as above. Restart Entresto , Coreg , Farxiga  and spironolactone  as above. The patient is euvolemic on exam. Plan to re-check echo if patient remains compliant with medications.   GIB EGD showed known bleeding duodenal ulcer with pigmented material and gastritis. Most recent Hgb 14.   Alcohol abuse Substance abuse He was in the ER last month with AMS found to have elevated ethanol level, UDS+ cocaine and THC.   Homelessness He says he is living  in [place] at this time. Social work has been consulted in the past.        Dispo: Follow-up in 2 months  Signed, Ronit Marczak VEAR Fishman, PA-C   "

## 2024-12-16 ENCOUNTER — Encounter: Payer: Self-pay | Admitting: Internal Medicine

## 2025-02-03 ENCOUNTER — Ambulatory Visit: Admitting: Cardiovascular Disease
